# Patient Record
Sex: Male | Born: 1985 | Race: White | Hispanic: No | Marital: Married | Smoking: Former smoker
Health system: Southern US, Community
[De-identification: ages and names within clinical notes are randomized; demographics above are authoritative.]

## PROBLEM LIST (undated history)

## (undated) DIAGNOSIS — K859 Acute pancreatitis without necrosis or infection, unspecified: Secondary | ICD-10-CM

## (undated) DIAGNOSIS — R32 Unspecified urinary incontinence: Secondary | ICD-10-CM

## (undated) DIAGNOSIS — M1990 Unspecified osteoarthritis, unspecified site: Secondary | ICD-10-CM

## (undated) DIAGNOSIS — E119 Type 2 diabetes mellitus without complications: Secondary | ICD-10-CM

## (undated) DIAGNOSIS — K861 Other chronic pancreatitis: Secondary | ICD-10-CM

## (undated) DIAGNOSIS — M879 Osteonecrosis, unspecified: Secondary | ICD-10-CM

## (undated) DIAGNOSIS — G629 Polyneuropathy, unspecified: Secondary | ICD-10-CM

## (undated) DIAGNOSIS — M542 Cervicalgia: Secondary | ICD-10-CM

## (undated) DIAGNOSIS — F319 Bipolar disorder, unspecified: Secondary | ICD-10-CM

## (undated) DIAGNOSIS — F419 Anxiety disorder, unspecified: Secondary | ICD-10-CM

## (undated) DIAGNOSIS — R159 Full incontinence of feces: Secondary | ICD-10-CM

## (undated) DIAGNOSIS — F259 Schizoaffective disorder, unspecified: Secondary | ICD-10-CM

## (undated) DIAGNOSIS — M549 Dorsalgia, unspecified: Secondary | ICD-10-CM

## (undated) DIAGNOSIS — K3184 Gastroparesis: Secondary | ICD-10-CM

## (undated) DIAGNOSIS — F329 Major depressive disorder, single episode, unspecified: Secondary | ICD-10-CM

## (undated) DIAGNOSIS — F32A Depression, unspecified: Secondary | ICD-10-CM

## (undated) DIAGNOSIS — G459 Transient cerebral ischemic attack, unspecified: Secondary | ICD-10-CM

## (undated) HISTORY — PX: LITHOTRIPSY: SUR834

## (undated) HISTORY — PX: ELBOW FRACTURE SURGERY: SHX616

## (undated) HISTORY — PX: NASAL SINUS SURGERY: SHX719

## (undated) HISTORY — PX: HERNIA REPAIR: SHX51

## (undated) HISTORY — PX: FOOT SURGERY: SHX648

## (undated) HISTORY — PX: CHOLECYSTECTOMY: SHX55

## (undated) HISTORY — PX: OTHER SURGICAL HISTORY: SHX169

## (undated) HISTORY — PX: COMBINED KIDNEY-PANCREAS TRANSPLANT: SHX1382

---
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
## 2019-01-26 ENCOUNTER — Encounter (HOSPITAL_COMMUNITY): Payer: Self-pay | Admitting: Emergency Medicine

## 2019-01-26 ENCOUNTER — Other Ambulatory Visit: Payer: Self-pay

## 2019-01-26 DIAGNOSIS — D649 Anemia, unspecified: Secondary | ICD-10-CM | POA: Diagnosis present

## 2019-01-26 DIAGNOSIS — Z7951 Long term (current) use of inhaled steroids: Secondary | ICD-10-CM | POA: Insufficient documentation

## 2019-01-26 DIAGNOSIS — N200 Calculus of kidney: Secondary | ICD-10-CM | POA: Diagnosis not present

## 2019-01-26 DIAGNOSIS — E119 Type 2 diabetes mellitus without complications: Secondary | ICD-10-CM | POA: Diagnosis not present

## 2019-01-26 DIAGNOSIS — F319 Bipolar disorder, unspecified: Secondary | ICD-10-CM | POA: Insufficient documentation

## 2019-01-26 DIAGNOSIS — Z79899 Other long term (current) drug therapy: Secondary | ICD-10-CM | POA: Diagnosis not present

## 2019-01-26 DIAGNOSIS — Z87891 Personal history of nicotine dependence: Secondary | ICD-10-CM | POA: Diagnosis not present

## 2019-01-26 DIAGNOSIS — Z7984 Long term (current) use of oral hypoglycemic drugs: Secondary | ICD-10-CM | POA: Insufficient documentation

## 2019-01-26 DIAGNOSIS — R740 Nonspecific elevation of levels of transaminase and lactic acid dehydrogenase [LDH]: Secondary | ICD-10-CM | POA: Insufficient documentation

## 2019-01-26 DIAGNOSIS — K859 Acute pancreatitis without necrosis or infection, unspecified: Secondary | ICD-10-CM | POA: Diagnosis not present

## 2019-01-26 DIAGNOSIS — F418 Other specified anxiety disorders: Secondary | ICD-10-CM | POA: Insufficient documentation

## 2019-01-26 DIAGNOSIS — Z791 Long term (current) use of non-steroidal anti-inflammatories (NSAID): Secondary | ICD-10-CM | POA: Insufficient documentation

## 2019-01-26 DIAGNOSIS — Z9049 Acquired absence of other specified parts of digestive tract: Secondary | ICD-10-CM | POA: Diagnosis not present

## 2019-01-26 MED ORDER — SODIUM CHLORIDE 0.9 % IV BOLUS
1000.0000 mL | Freq: Once | INTRAVENOUS | Status: AC
Start: 2019-01-26 — End: 2019-01-27
  Administered 2019-01-27: 1000 mL via INTRAVENOUS

## 2019-01-26 MED ORDER — ONDANSETRON HCL 4 MG/2ML IJ SOLN
4.0000 mg | Freq: Once | INTRAMUSCULAR | Status: AC
  Administered 2019-01-27: 4 mg via INTRAVENOUS
  Filled 2019-01-26: qty 2

## 2019-01-26 NOTE — ED Triage Notes (Signed)
Pt reports he has had 7 episodes of pancreatitis since the summer of 2019 and feels same, pt c/o left upper abd that radiates to the upper mid abd with n/v, denies diarrhea, pt reports last BM was today, pain started this am

## 2019-01-27 ENCOUNTER — Observation Stay (HOSPITAL_COMMUNITY): Payer: Medicaid - Out of State

## 2019-01-27 ENCOUNTER — Observation Stay (HOSPITAL_COMMUNITY)
Admission: EM | Admit: 2019-01-27 | Discharge: 2019-01-28 | Disposition: A | Discharge status: Home / Self Care | Payer: Medicaid - Out of State | Attending: Family Medicine | Admitting: Family Medicine

## 2019-01-27 DIAGNOSIS — R7401 Elevation of levels of liver transaminase levels: Secondary | ICD-10-CM

## 2019-01-27 DIAGNOSIS — R740 Nonspecific elevation of levels of transaminase and lactic acid dehydrogenase [LDH]: Secondary | ICD-10-CM

## 2019-01-27 DIAGNOSIS — K859 Acute pancreatitis without necrosis or infection, unspecified: Secondary | ICD-10-CM

## 2019-01-27 DIAGNOSIS — Z794 Long term (current) use of insulin: Secondary | ICD-10-CM

## 2019-01-27 DIAGNOSIS — K858 Other acute pancreatitis without necrosis or infection: Secondary | ICD-10-CM | POA: Diagnosis not present

## 2019-01-27 DIAGNOSIS — D649 Anemia, unspecified: Secondary | ICD-10-CM

## 2019-01-27 DIAGNOSIS — E119 Type 2 diabetes mellitus without complications: Secondary | ICD-10-CM

## 2019-01-27 DIAGNOSIS — F319 Bipolar disorder, unspecified: Secondary | ICD-10-CM

## 2019-01-27 DIAGNOSIS — F418 Other specified anxiety disorders: Secondary | ICD-10-CM

## 2019-01-27 HISTORY — DX: Major depressive disorder, single episode, unspecified: F32.9

## 2019-01-27 HISTORY — DX: Cervicalgia: M54.2

## 2019-01-27 HISTORY — DX: Schizoaffective disorder, unspecified: F25.9

## 2019-01-27 HISTORY — DX: Depression, unspecified: F32.A

## 2019-01-27 HISTORY — DX: Type 2 diabetes mellitus without complications: E11.9

## 2019-01-27 HISTORY — DX: Anxiety disorder, unspecified: F41.9

## 2019-01-27 HISTORY — DX: Dorsalgia, unspecified: M54.9

## 2019-01-27 HISTORY — DX: Unspecified osteoarthritis, unspecified site: M19.90

## 2019-01-27 HISTORY — DX: Bipolar disorder, unspecified: F31.9

## 2019-01-27 LAB — CBC WITH DIFFERENTIAL/PLATELET
ABS IMMATURE GRANULOCYTES: 0.03 10*3/uL (ref 0.00–0.07)
Basophils Absolute: 0.1 10*3/uL (ref 0.0–0.1)
Basophils Relative: 1 %
Eosinophils Absolute: 0.1 10*3/uL (ref 0.0–0.5)
Eosinophils Relative: 2 %
HCT: 41.3 % (ref 39.0–52.0)
HEMOGLOBIN: 12.7 g/dL — AB (ref 13.0–17.0)
Immature Granulocytes: 0 %
LYMPHS PCT: 43 %
Lymphs Abs: 3.4 10*3/uL (ref 0.7–4.0)
MCH: 25.1 pg — ABNORMAL LOW (ref 26.0–34.0)
MCHC: 30.8 g/dL (ref 30.0–36.0)
MCV: 81.6 fL (ref 80.0–100.0)
MONO ABS: 0.7 10*3/uL (ref 0.1–1.0)
Monocytes Relative: 9 %
NEUTROS ABS: 3.6 10*3/uL (ref 1.7–7.7)
NRBC: 0 % (ref 0.0–0.2)
Neutrophils Relative %: 45 %
Platelets: 290 10*3/uL (ref 150–400)
RBC: 5.06 MIL/uL (ref 4.22–5.81)
RDW: 13.2 % (ref 11.5–15.5)
WBC: 7.9 10*3/uL (ref 4.0–10.5)

## 2019-01-27 LAB — COMPREHENSIVE METABOLIC PANEL
ALBUMIN: 4.5 g/dL (ref 3.5–5.0)
ALT: 45 U/L — ABNORMAL HIGH (ref 0–44)
AST: 28 U/L (ref 15–41)
Alkaline Phosphatase: 108 U/L (ref 38–126)
Anion gap: 8 (ref 5–15)
BUN: 17 mg/dL (ref 6–20)
CHLORIDE: 105 mmol/L (ref 98–111)
CO2: 23 mmol/L (ref 22–32)
CREATININE: 1.05 mg/dL (ref 0.61–1.24)
Calcium: 9.2 mg/dL (ref 8.9–10.3)
GFR calc non Af Amer: >60 mL/min (ref >60)
GLUCOSE: 136 mg/dL — AB (ref 70–99)
Potassium: 3.8 mmol/L (ref 3.5–5.1)
SODIUM: 136 mmol/L (ref 135–145)
Total Bilirubin: 0.3 mg/dL (ref 0.3–1.2)
Total Protein: 7.2 g/dL (ref 6.5–8.1)

## 2019-01-27 LAB — MRSA PCR SCREENING: MRSA by PCR: NEGATIVE

## 2019-01-27 LAB — GLUCOSE, CAPILLARY: Glucose-Capillary: 117 mg/dL — ABNORMAL HIGH (ref 70–99)

## 2019-01-27 LAB — CBG MONITORING, ED
GLUCOSE-CAPILLARY: 101 mg/dL — AB (ref 70–99)
Glucose-Capillary: 123 mg/dL — ABNORMAL HIGH (ref 70–99)

## 2019-01-27 LAB — LIPASE, BLOOD: Lipase: 110 U/L — ABNORMAL HIGH (ref 11–51)

## 2019-01-27 MED ORDER — LEVOCETIRIZINE DIHYDROCHLORIDE 5 MG PO TABS: 5.0000 mg | ORAL_TABLET | Freq: Every evening | ORAL | Status: DC

## 2019-01-27 MED ORDER — MONTELUKAST SODIUM 10 MG PO TABS
10.0000 mg | ORAL_TABLET | Freq: Every day | ORAL | Status: DC
  Administered 2019-01-27: 10 mg via ORAL
  Filled 2019-01-27: qty 1

## 2019-01-27 MED ORDER — IOHEXOL 300 MG/ML  SOLN
100.0000 mL | Freq: Once | INTRAMUSCULAR | Status: AC | PRN
  Administered 2019-01-27: 100 mL via INTRAVENOUS

## 2019-01-27 MED ORDER — DIVALPROEX SODIUM 250 MG PO DR TAB
250.0000 mg | DELAYED_RELEASE_TABLET | Freq: Two times a day (BID) | ORAL | Status: DC
  Administered 2019-01-27 – 2019-01-28 (×3): 250 mg via ORAL
  Filled 2019-01-27 (×3): qty 1

## 2019-01-27 MED ORDER — INSULIN ASPART 100 UNIT/ML ~~LOC~~ SOLN: 0.0000 [IU] | SUBCUTANEOUS | Status: DC

## 2019-01-27 MED ORDER — ESCITALOPRAM OXALATE 10 MG PO TABS
20.0000 mg | ORAL_TABLET | Freq: Every day | ORAL | Status: DC
  Administered 2019-01-27 – 2019-01-28 (×2): 20 mg via ORAL
  Filled 2019-01-27 (×2): qty 2

## 2019-01-27 MED ORDER — DOCUSATE SODIUM 100 MG PO CAPS
100.0000 mg | ORAL_CAPSULE | Freq: Every day | ORAL | Status: DC
  Administered 2019-01-27 – 2019-01-28 (×2): 100 mg via ORAL
  Filled 2019-01-27 (×2): qty 1

## 2019-01-27 MED ORDER — HYDROMORPHONE HCL 1 MG/ML IJ SOLN
1.0000 mg | INTRAMUSCULAR | Status: DC | PRN
  Administered 2019-01-27 – 2019-01-28 (×5): 1 mg via INTRAVENOUS
  Filled 2019-01-27 (×5): qty 1

## 2019-01-27 MED ORDER — HYDROXYZINE HCL 25 MG PO TABS
50.0000 mg | ORAL_TABLET | Freq: Four times a day (QID) | ORAL | Status: DC
  Administered 2019-01-27 – 2019-01-28 (×4): 50 mg via ORAL
  Filled 2019-01-27 (×4): qty 2

## 2019-01-27 MED ORDER — MORPHINE SULFATE (PF) 4 MG/ML IV SOLN
4.0000 mg | Freq: Once | INTRAVENOUS | Status: AC
  Administered 2019-01-27: 4 mg via INTRAVENOUS
  Filled 2019-01-27: qty 1

## 2019-01-27 MED ORDER — IOPAMIDOL (ISOVUE-300) INJECTION 61%
30.0000 mL | Freq: Once | INTRAVENOUS | Status: AC | PRN
  Administered 2019-01-27: 30 mL via ORAL

## 2019-01-27 MED ORDER — SUCRALFATE 1 G PO TABS
1.0000 g | ORAL_TABLET | Freq: Two times a day (BID) | ORAL | Status: DC
  Administered 2019-01-27 – 2019-01-28 (×3): 1 g via ORAL
  Filled 2019-01-27 (×3): qty 1

## 2019-01-27 MED ORDER — PROPRANOLOL HCL 20 MG PO TABS
40.0000 mg | ORAL_TABLET | Freq: Two times a day (BID) | ORAL | Status: DC
  Administered 2019-01-27 – 2019-01-28 (×3): 40 mg via ORAL
  Filled 2019-01-27: qty 4
  Filled 2019-01-27 (×2): qty 2

## 2019-01-27 MED ORDER — LORATADINE 10 MG PO TABS
10.0000 mg | ORAL_TABLET | Freq: Every day | ORAL | Status: DC
  Administered 2019-01-27 – 2019-01-28 (×2): 10 mg via ORAL
  Filled 2019-01-27 (×2): qty 1

## 2019-01-27 MED ORDER — PANTOPRAZOLE SODIUM 40 MG PO TBEC
40.0000 mg | DELAYED_RELEASE_TABLET | Freq: Every day | ORAL | Status: DC
  Administered 2019-01-27 – 2019-01-28 (×2): 40 mg via ORAL
  Filled 2019-01-27 (×2): qty 1

## 2019-01-27 MED ORDER — ONDANSETRON HCL 4 MG/2ML IJ SOLN
4.0000 mg | Freq: Four times a day (QID) | INTRAMUSCULAR | Status: DC | PRN
  Administered 2019-01-27: 4 mg via INTRAVENOUS
  Filled 2019-01-27: qty 2

## 2019-01-27 MED ORDER — SODIUM CHLORIDE 0.9 % IV SOLN
INTRAVENOUS | Status: DC
Start: 2019-01-27 — End: 2019-01-28
  Administered 2019-01-27 (×2): via INTRAVENOUS

## 2019-01-27 MED ORDER — POLYVINYL ALCOHOL 1.4 % OP SOLN
2.0000 [drp] | Freq: Every day | OPHTHALMIC | Status: DC
  Administered 2019-01-27 – 2019-01-28 (×2): 2 [drp] via OPHTHALMIC
  Filled 2019-01-27 (×2): qty 15

## 2019-01-27 MED ORDER — ACETAMINOPHEN 650 MG RE SUPP: 650.0000 mg | Freq: Four times a day (QID) | RECTAL | Status: DC | PRN

## 2019-01-27 MED ORDER — ARIPIPRAZOLE 10 MG PO TABS
10.0000 mg | ORAL_TABLET | Freq: Every day | ORAL | Status: DC
  Administered 2019-01-27 – 2019-01-28 (×2): 10 mg via ORAL
  Filled 2019-01-27: qty 2
  Filled 2019-01-27: qty 1

## 2019-01-27 MED ORDER — ACETAMINOPHEN 325 MG PO TABS: 650.0000 mg | ORAL_TABLET | Freq: Four times a day (QID) | ORAL | Status: DC | PRN

## 2019-01-27 MED ORDER — SODIUM CHLORIDE 0.9 % IV BOLUS
1000.0000 mL | Freq: Once | INTRAVENOUS | Status: AC
  Administered 2019-01-27: 1000 mL via INTRAVENOUS

## 2019-01-27 MED ORDER — ENOXAPARIN SODIUM 40 MG/0.4ML ~~LOC~~ SOLN
40.0000 mg | SUBCUTANEOUS | Status: DC
  Administered 2019-01-27: 40 mg via SUBCUTANEOUS
  Filled 2019-01-27: qty 0.4

## 2019-01-27 MED ORDER — LIFITEGRAST 5 % OP SOLN: 2.0000 [drp] | Freq: Every day | OPHTHALMIC | Status: DC

## 2019-01-27 MED ORDER — ALBUTEROL SULFATE (2.5 MG/3ML) 0.083% IN NEBU: 2.5000 mg | INHALATION_SOLUTION | RESPIRATORY_TRACT | Status: DC | PRN

## 2019-01-27 MED ORDER — ALBUTEROL SULFATE HFA 108 (90 BASE) MCG/ACT IN AERS
1.0000 | INHALATION_SPRAY | RESPIRATORY_TRACT | Status: DC | PRN
  Filled 2019-01-27: qty 6.7

## 2019-01-27 MED ORDER — FLUTICASONE PROPIONATE 50 MCG/ACT NA SUSP
1.0000 | Freq: Every day | NASAL | Status: DC
  Administered 2019-01-27 – 2019-01-28 (×2): 1 via NASAL
  Filled 2019-01-27 (×2): qty 16

## 2019-01-27 MED ORDER — PRAZOSIN HCL 5 MG PO CAPS
5.0000 mg | ORAL_CAPSULE | Freq: Every day | ORAL | Status: DC
  Administered 2019-01-27: 5 mg via ORAL
  Filled 2019-01-27 (×3): qty 1

## 2019-01-27 MED ORDER — PHENTERMINE HCL 37.5 MG PO CAPS: 37.5000 mg | ORAL_CAPSULE | ORAL | Status: DC

## 2019-01-27 MED ORDER — GABAPENTIN 100 MG PO CAPS
800.0000 mg | ORAL_CAPSULE | Freq: Three times a day (TID) | ORAL | Status: DC
  Administered 2019-01-27 – 2019-01-28 (×2): 800 mg via ORAL
  Filled 2019-01-27 (×2): qty 8

## 2019-01-27 NOTE — ED Notes (Signed)
edp in room  

## 2019-01-27 NOTE — H&P (Signed)
History and Physical    Dennis Yang ZOX:[id number] DOB: [birth year] DOA: 01/27/2019  PCP: Patient, [name]   Patient coming from: Home  Chief Complaint: Epigastric abdominal pain  HPI: Dennis Yang is a 33 y.o. male with medical history significant for bipolar disorder, type 2 diabetes, depression/anxiety, and recurrent pancreatitis who presented to the ED with epigastric and left upper quadrant abdominal pain which started at approximately 11 AM yesterday.  He states he had radiation of pain to his back with associated nausea and vomiting and rated the pain 9/10.  This is similar to previous episodes of pancreatitis that he has had before with [facility] well as [facility] where he sees gastroenterology.  He has not had any alcohol for the last 2 years and has not had any other unusual food intake.  He is having bowel movements that are not dark or bloody and is passing flatus.   ED Course: Vital signs are stable and laboratory data with lipase of 110 noted.  LFTs are otherwise normal.  CT of the abdomen pelvis with contrast does demonstrate inflammation suggestive of acute pancreatitis to the head of the pancreas along with increase in the caliber of the proximal small bowel with tapering off suggestive of possible ileus and/or bowel obstruction.  Patient has been given 2 L IV fluid bolus and remains on aggressive IV fluid resuscitation.  He has been given multiple doses of IV morphine with minimal pain relief, but states that Dilaudid is not helping.  He denies any significant nausea or vomiting at this time after Zofran administration.  Review of Systems: All others reviewed and otherwise negative.  Past Medical History:  Diagnosis Date  . Anxiety   . Back pain   . Bipolar 1 disorder (HCC)   . Depression   . Diabetes mellitus without complication (HCC)   . Neck pain   . Osteoarthritis   . Schizoaffective disorder [facility](HCC)     Past  Surgical History:  Procedure Laterality Date  . CHOLECYSTECTOMY    . ELBOW FRACTURE SURGERY    . HERNIA REPAIR    . LITHOTRIPSY    . NASAL SINUS SURGERY       reports that he has quit smoking. He has never used smokeless tobacco. He reports previous alcohol use. He reports previous drug use.  Allergies  Allergen Reactions  . Penicillins Anaphylaxis    Did it involve swelling of the face/tongue/throat, SOB, or low BP? Yes Did it involve sudden or severe rash/hives, skin peeling, or any reaction on the inside of your mouth or nose? Yes Did you need to seek medical attention at a hospital or doctor's office? Yes When did it last happen? If all above answers are "NO", may proceed with cephalosporin use.   . Ciprofloxacin Hives    History reviewed. No pertinent family history.  Prior to Admission medications   Medication Sig Start Date End Date Taking? Authorizing Provider  ARIPiprazole (ABILIFY) 10 MG tablet Take 10 mg by mouth daily.  01/22/19  Yes [provider]  ATROVENT HFA 17 MCG/ACT inhaler Inhale 1 puff into the lungs every 4 (four) hours as needed for wheezing.  01/16/19  Yes [provider]  CARAFATE 1 g tablet Take 1 g by mouth 2 (two) times daily.  12/20/18  Yes [provider]  celecoxib (CELEBREX) 100 MG capsule Take 100 mg by mouth 2 (two) times daily.  01/12/19  Yes [provider]  Cholecalciferol (VITAMIN D3) 1.25 MG (50000 UT) CAPS Take 1 capsule by mouth. 3 times a week 12/24/18  Yes [provider]  divalproex (DEPAKOTE) 250 MG DR tablet Take 250 mg by mouth 2 (two) times daily.  01/22/19  Yes [provider]  docusate sodium (COLACE) 100 MG capsule Take 100 mg by mouth daily.  01/01/19  Yes [provider]  EMGALITY 120 MG/ML SOAJ Inject 1 Dose into the muscle every 30 (thirty) days.  11/24/18  Yes [provider]  escitalopram (LEXAPRO) 20 MG tablet Take 20 mg by mouth daily.  01/22/19  Yes  [provider]  FARXIGA 5 MG TABS tablet Take 5 mg by mouth daily.  01/07/19  Yes [provider]  fluticasone (FLONASE) 50 MCG/ACT nasal spray Place 1 spray into both nostrils daily.  01/20/19  Yes [provider]  gabapentin (NEURONTIN) 800 MG tablet Take 800 mg by mouth 3 (three) times daily. 01/22/19  Yes [provider]  glipiZIDE (GLUCOTROL) 10 MG tablet Take 10 mg by mouth 2 (two) times daily. 01/19/19  Yes [provider]  hydrOXYzine (ATARAX/VISTARIL) 50 MG tablet Take 50 mg by mouth 4 (four) times daily. 01/22/19  Yes [provider]  levocetirizine (XYZAL) 5 MG tablet Take 5 mg by mouth every evening.  01/15/19  Yes [provider]  metaxalone (SKELAXIN) 800 MG tablet Take 800 mg by mouth 2 (two) times daily.  01/12/19  Yes [provider]  montelukast (SINGULAIR) 10 MG tablet Take 10 mg by mouth daily.  01/12/19  Yes [provider]  omeprazole (PRILOSEC) 40 MG capsule Take 40 mg by mouth daily.  01/19/19  Yes [provider]  ondansetron (ZOFRAN-ODT) 8 MG disintegrating tablet Take 8 mg by mouth every 8 (eight) hours as needed for nausea.  01/17/19  Yes [provider]  phentermine 37.5 MG capsule Take 37.5 mg by mouth every morning.   Yes [provider]  prazosin (MINIPRESS) 5 MG capsule Take 5 mg by mouth at bedtime.  01/22/19  Yes [provider]  PROAIR HFA 108 (90 Base) MCG/ACT inhaler Inhale 1-2 puffs into the lungs every 4 (four) hours as needed for wheezing.  01/17/19  Yes [provider]  propranolol (INDERAL) 40 MG tablet Take 40 mg by mouth 2 (two) times daily.  01/12/19  Yes [provider]  simvastatin (ZOCOR) 20 MG tablet Take 20 mg by mouth daily at 6 PM.  12/25/18  Yes [provider]  XIIDRA 5 % SOLN Place 2 drops into both eyes daily.  01/21/19  Yes [provider]    Physical Exam: Vitals:   01/27/19 0511 01/27/19 0911 01/27/19 0930  01/27/19 1101  BP: 124/77 128/70 123/68 119/75  Pulse:  69 69 65  Resp:  16  16  Temp:  97.7 F (36.5 C)    TempSrc:  Oral    SpO2:  100% 97% 99%  Weight:      Height:        Constitutional: NAD, calm, mildly uncomfortable Vitals:   01/27/19 0511 01/27/19 0911 01/27/19 0930 01/27/19 1101  BP: 124/77 128/70 123/68 119/75  Pulse:  69 69 65  Resp:  16  16  Temp:  97.7 F (36.5 C)    TempSrc:  Oral    SpO2:  100% 97% 99%  Weight:      Height:       Eyes: lids and conjunctivae normal ENMT: Mucous membranes are moist.  Neck:  normal, supple Respiratory: clear to auscultation bilaterally. Normal respiratory effort. No accessory muscle use.  Cardiovascular: Regular rate and rhythm, no murmurs. No extremity edema. Abdomen: Generalized tenderness to palpation, no distention. Bowel sounds positive.  Musculoskeletal:  No joint deformity upper and lower extremities.   Skin: no rashes, lesions, ulcers.  Tattoos throughout Psychiatric: Normal judgment and insight. Alert and oriented x 3. Normal mood.   Labs on Admission: I have personally reviewed following labs and imaging studies  CBC: Recent Labs  Lab 01/26/19 2359  WBC 7.9  NEUTROABS 3.6  HGB 12.7*  HCT 41.3  MCV 81.6  PLT 290   Basic Metabolic Panel: Recent Labs  Lab 01/26/19 2359  NA 136  K 3.8  CL 105  CO2 23  GLUCOSE 136*  BUN 17  CREATININE 1.05  CALCIUM 9.2   GFR: Estimated Creatinine Clearance: 124.6 mL/min (by C-G formula based on SCr of 1.05 mg/dL). Liver Function Tests: Recent Labs  Lab 01/26/19 2359  AST 28  ALT 45*  ALKPHOS 108  BILITOT 0.3  PROT 7.2  ALBUMIN 4.5   Recent Labs  Lab 01/26/19 2359  LIPASE 110*   No results for input(s): AMMONIA in the last 168 hours. Coagulation Profile: No results for input(s): INR, PROTIME in the last 168 hours. Cardiac Enzymes: No results for input(s): CKTOTAL, CKMB, CKMBINDEX, TROPONINI in the last 168 hours. BNP (last 3 results) No results for  input(s): PROBNP in the last 8760 hours. HbA1C: No results for input(s): HGBA1C in the last 72 hours. CBG: No results for input(s): GLUCAP in the last 168 hours. Lipid Profile: No results for input(s): CHOL, HDL, LDLCALC, TRIG, CHOLHDL, LDLDIRECT in the last 72 hours. Thyroid Function Tests: No results for input(s): TSH, T4TOTAL, FREET4, T3FREE, THYROIDAB in the last 72 hours. Anemia Panel: No results for input(s): VITAMINB12, FOLATE, FERRITIN, TIBC, IRON, RETICCTPCT in the last 72 hours. Urine analysis: No results found for: COLORURINE, APPEARANCEUR, LABSPEC, PHURINE, GLUCOSEU, HGBUR, BILIRUBINUR, KETONESUR, PROTEINUR, UROBILINOGEN, NITRITE, LEUKOCYTESUR  Radiological Exams on Admission: Ct Abdomen Pelvis W Contrast  Result Date: 01/27/2019 CLINICAL DATA:  Left upper abdominal pain. EXAM: CT ABDOMEN AND PELVIS WITH CONTRAST TECHNIQUE: Multidetector CT imaging of the abdomen and pelvis was performed using the standard protocol following bolus administration of intravenous contrast. CONTRAST:  30mL ISOVUE-300 IOPAMIDOL (ISOVUE-300) INJECTION 61%, 100mL OMNIPAQUE IOHEXOL 300 MG/ML SOLN COMPARISON:  10/04/2017 FINDINGS: Lower chest: No acute abnormality. Scarring identified within both lung bases. Hepatobiliary: No focal liver abnormality. Previous cholecystectomy. No biliary dilatation. Pancreas: Calcifications within the head of pancreas are identified, new from 10/04/2017. There is mild edema surrounding the head of pancreas and adjacent soft tissues. No significant free fluid. No main duct dilatation identified. Spleen: Normal in size without focal abnormality. Adrenals/Urinary Tract: The adrenal glands appear normal. Two right renal calculi identified measuring up to 4 mm. No mass or hydronephrosis identified bilaterally. The urinary bladder appears normal. Stomach/Bowel: The stomach appears normal. Focal area of luminal narrowing of the descending duodenum adjacent to head of pancreas is noted.  Proximal and mid small bowel loops are increased in caliber with air-fluid levels. These measure up to 3.1 cm. Transition to normal caliber distal small bowel is noted within the right abdomen, image 45/2 and image 42/8. Within the left upper quadrant of the abdomen there is a segment of proximal small bowel which exhibits abnormal wall thickening measuring up to 0.9 cm. Fecalization enteric contents within the terminal ileum is identified, image 44/8 which may reflect decreased  bowel transit. Unremarkable appearance of the colon. Vascular/Lymphatic: No significant vascular findings are present. No enlarged abdominal or pelvic lymph nodes. Reproductive: Status post hysterectomy. No adnexal masses. Other: No free fluid or fluid collections identified. Musculoskeletal: No acute or significant osseous findings. IMPRESSION: 1. There is mild edema and inflammation involving the head of pancreas. Calcifications within the head of pancreas are new from 11/06/2017. Findings are suspicious for acute on chronic pancreatitis. No significant free fluid, pancreatic necrosis or pseudocyst. 2. There is abnormal increase caliber of the proximal and mid small bowel loops with a transition to decreased distal small bowel. Imaging findings may reflect small-bowel ileus secondary to pancreatic inflammation. Small bowel obstruction is not excluded. 3. Focal segment of proximal small bowel within the left upper quadrant of the abdomen exhibits wall thickening. This may reflect secondary inflammation from pancreatitis or nonspecific infectious enteritis. Clinical correlation advised. 4. Nonobstructing right renal calculi. Electronically Signed   By: Signa Kell M.D.   On: 01/27/2019 10:34    Assessment/Plan Principal Problem:   Acute pancreatitis Active Problems:   Bipolar 1 disorder (HCC)   Depression with anxiety   Type 2 diabetes mellitus without complication (HCC)    1. Acute, recurrent pancreatitis.  Will follow liver  enzymes as well as lipase in a.m. and maintain on aggressive IV fluid hydration.  Zofran as needed for nausea and vomiting.  Pain management with Dilaudid every 2 hours which appears to be helping.  Please consult GI as needed, but will avoid for now. 2. Ileus versus partial SBO related to above.  This appears to be inflammatory nature per CT scan.  We will try liquids for now and advance diet as tolerated.  Recheck electrolytes in a.m. 3. Bipolar 1 disorder.  Restart home medications and monitor. 4. Depression with anxiety.  Restart home medications. 5. Type 2 diabetes.  Currently, without significant hyperglycemia.  Will start on SSI and avoid home oral hypoglycemics until diet has stabilized.   DVT prophylaxis: Lovenox Code Status: Full Family Communication: None at bedside Disposition Plan:Admit for treatment of acute pancreatitis Consults called:None Admission status: Observation, MedSurg   Leonard Hendler Hoover Brunette DO [facility] Pager [phone]  If 7PM-7AM, please contact night-coverage [email or web address] Password [email or web address]  01/27/2019, 12:00 PM

## 2019-01-27 NOTE — ED Provider Notes (Signed)
[facility] EMERGENCY DEPARTMENT Provider Note   CSN: [id number] Arrival date & time: 01/26/19  2039    History   Chief Complaint Chief Complaint  Patient presents with  . Abdominal Pain    HPI Dennis Yang is a 33 y.o. male.  The history is provided by the patient.  He has history of bipolar disorder, diabetes, pancreatitis and comes in with periumbilical and left upper quadrant pain which started about 11 AM.  There is radiation of pain to the back.  There is associated nausea without vomiting.  He rates pain at 9/10.  Pain is typical to what he has had with episodes of pancreatitis before.  He was last admitted for pancreatitis 2 months ago in [place].  He has been followed by gastroenterology at [facility].  He states that he did drink up until about 2 years ago but has been abstinent since then.  Apparently, reason for recurrent episodes of pancreatitis has not been discerned.  He denies alcohol consumption recently and denies unusual food intake.  Past Medical History:  Diagnosis Date  . Anxiety   . Back pain   . Bipolar 1 disorder (HCC)   . Depression   . Diabetes mellitus without complication (HCC)   . Neck pain   . Osteoarthritis   . Schizoaffective disorder (HCC)     There are no active problems to display for this patient.   Past Surgical History:  Procedure Laterality Date  . CHOLECYSTECTOMY    . ELBOW FRACTURE SURGERY    . HERNIA REPAIR    . LITHOTRIPSY    . NASAL SINUS SURGERY          Home Medications    Prior to Admission medications   Not on File    Family History History reviewed. No pertinent family history.  Social History Social History   Tobacco Use  . Smoking status: Former Games developer  . Smokeless tobacco: Never Used  Substance Use Topics  . Alcohol use: Not Currently  . Drug use: Not Currently     Allergies   Penicillins and Ciprofloxacin   Review of Systems Review of Systems  All other systems  reviewed and are negative.    Physical Exam Updated Vital Signs BP 129/79 (BP Location: Right Arm)   Pulse 71   Temp 97.6 F (36.4 C) (Oral)   Resp 18   Ht 6' (1.829 m)   Wt 97.5 kg   SpO2 98%   BMI 29.16 kg/m   Physical Exam Vitals signs and nursing note reviewed.    33 year old male, resting comfortably and in no acute distress. Vital signs are normal. Oxygen saturation is 98%, which is normal. Head is normocephalic and atraumatic. PERRLA, EOMI. Oropharynx is clear. Neck is nontender and supple without adenopathy or JVD. Back is nontender and there is no CVA tenderness. Lungs are clear without rales, wheezes, or rhonchi. Chest is nontender. Heart has regular rate and rhythm without murmur. Abdomen is soft, flat, with moderate upper abdominal tenderness which is poorly localized.  There is no rebound or guarding.  There are no masses or hepatosplenomegaly and peristalsis is normoactive. Extremities have no cyanosis or edema, full range of motion is present. Skin is warm and dry without rash. Neurologic: Mental status is normal, cranial nerves are intact, there are no motor or sensory deficits.  ED Treatments / Results  Labs (all labs ordered are listed, but only abnormal results are displayed) Labs Reviewed  COMPREHENSIVE METABOLIC PANEL -  Abnormal; Notable for the following components:      Result Value   Glucose, Bld 136 (*)    ALT 45 (*)    All other components within normal limits  LIPASE, BLOOD - Abnormal; Notable for the following components:   Lipase 110 (*)    All other components within normal limits  CBC WITH DIFFERENTIAL/PLATELET - Abnormal; Notable for the following components:   Hemoglobin 12.7 (*)    MCH 25.1 (*)    All other components within normal limits   Procedures Procedures  Medications Ordered in ED Medications  sodium chloride 0.9 % bolus 1,000 mL (0 mLs Intravenous Stopped 01/27/19 0509)  ondansetron (ZOFRAN) injection 4 mg (4 mg  Intravenous Given 01/27/19 0358)  morphine 4 MG/ML injection 4 mg (4 mg Intravenous Given 01/27/19 0358)  morphine 4 MG/ML injection 4 mg (4 mg Intravenous Given 01/27/19 0509)  morphine 4 MG/ML injection 4 mg (4 mg Intravenous Given 01/27/19 0611)  iopamidol (ISOVUE-300) 61 % injection 30 mL (30 mLs Oral Contrast Given 01/27/19 0737)     Initial Impression / Assessment and Plan / ED Course  I have reviewed the triage vital signs and the nursing notes.  Pertinent lab results that were available during my care of the patient were reviewed by me and considered in my medical decision making (see chart for details).  Recurrent abdominal pain consistent with pancreatitis.  Labs obtained at triage do show elevated lipase at 110.  Hemoglobin is slightly low at 12.7.  He has no prior records in the [facility] health system, but on care everywhere, hemoglobin is noted to have been 12.4 on December 31.  Lipase has been as high as 4577 in July 2018, but has been normal since then.  At this point, I do not see indication for CT scan.  We will try IV fluids, morphine, ondansetron.  Patient does want a second opinion, and he will be referred to gastroenterology at [facility].  Following IV fluids, ondansetron, morphine, pain was not improved but nausea was.  He was given 2 additional doses of morphine which failed to help his pain.  Case was discussed with Dr. Sherryll Burger of [facility], who agrees to admit the patient.  Final Clinical Impressions(s) / ED Diagnoses   Final diagnoses:  Acute pancreatitis, unspecified complication status, unspecified pancreatitis type  Elevated AST (SGOT)  Normochromic normocytic anemia    ED Discharge Orders    None       Dione Booze, MD 01/27/19 [phone]

## 2019-01-27 NOTE — ED Notes (Signed)
Completed oral contrast. 

## 2019-01-27 NOTE — ED Notes (Signed)
Pt given clear liquid meal tray

## 2019-01-28 DIAGNOSIS — K858 Other acute pancreatitis without necrosis or infection: Secondary | ICD-10-CM | POA: Diagnosis not present

## 2019-01-28 DIAGNOSIS — E119 Type 2 diabetes mellitus without complications: Secondary | ICD-10-CM

## 2019-01-28 DIAGNOSIS — Z794 Long term (current) use of insulin: Secondary | ICD-10-CM | POA: Diagnosis not present

## 2019-01-28 LAB — COMPREHENSIVE METABOLIC PANEL
ALT: 96 U/L — ABNORMAL HIGH (ref 0–44)
AST: 76 U/L — AB (ref 15–41)
Albumin: 3.7 g/dL (ref 3.5–5.0)
Alkaline Phosphatase: 90 U/L (ref 38–126)
Anion gap: 7 (ref 5–15)
BUN: 10 mg/dL (ref 6–20)
CO2: 23 mmol/L (ref 22–32)
Calcium: 8.3 mg/dL — ABNORMAL LOW (ref 8.9–10.3)
Chloride: 110 mmol/L (ref 98–111)
Creatinine, Ser: 0.84 mg/dL (ref 0.61–1.24)
GFR calc Af Amer: >60 mL/min (ref >60)
GFR calc non Af Amer: >60 mL/min (ref >60)
GLUCOSE: 115 mg/dL — AB (ref 70–99)
Potassium: 4 mmol/L (ref 3.5–5.1)
Sodium: 140 mmol/L (ref 135–145)
Total Bilirubin: 0.2 mg/dL — ABNORMAL LOW (ref 0.3–1.2)
Total Protein: 5.9 g/dL — ABNORMAL LOW (ref 6.5–8.1)

## 2019-01-28 LAB — CBC
HCT: 34.9 % — ABNORMAL LOW (ref 39.0–52.0)
Hemoglobin: 11 g/dL — ABNORMAL LOW (ref 13.0–17.0)
MCH: 26.3 pg (ref 26.0–34.0)
MCHC: 31.5 g/dL (ref 30.0–36.0)
MCV: 83.3 fL (ref 80.0–100.0)
Platelets: 203 10*3/uL (ref 150–400)
RBC: 4.19 MIL/uL — ABNORMAL LOW (ref 4.22–5.81)
RDW: 12.9 % (ref 11.5–15.5)
WBC: 4.2 10*3/uL (ref 4.0–10.5)
nRBC: 0 % (ref 0.0–0.2)

## 2019-01-28 LAB — MAGNESIUM: Magnesium: 2.1 mg/dL (ref 1.7–2.4)

## 2019-01-28 LAB — GLUCOSE, CAPILLARY
Glucose-Capillary: 118 mg/dL — ABNORMAL HIGH (ref 70–99)
Glucose-Capillary: 104 mg/dL — ABNORMAL HIGH (ref 70–99)
Glucose-Capillary: 170 mg/dL — ABNORMAL HIGH (ref 70–99)

## 2019-01-28 LAB — LIPASE, BLOOD: Lipase: 29 U/L (ref 11–51)

## 2019-01-28 LAB — HIV ANTIBODY (ROUTINE TESTING W REFLEX): HIV Screen 4th Generation wRfx: NONREACTIVE

## 2019-01-28 MED ORDER — HYDROCODONE-ACETAMINOPHEN 5-325 MG PO TABS: 1.0000 | ORAL_TABLET | ORAL | 0 refills | Status: DC | PRN

## 2019-01-28 MED ORDER — OMEPRAZOLE 40 MG PO CPDR: 40.0000 mg | DELAYED_RELEASE_CAPSULE | Freq: Every day | ORAL | 3 refills | Status: DC

## 2019-01-28 MED ORDER — ONDANSETRON 8 MG PO TBDP: 8.0000 mg | ORAL_TABLET | Freq: Three times a day (TID) | ORAL | 4 refills | Status: DC | PRN

## 2019-01-28 MED ORDER — ACETAMINOPHEN 325 MG PO TABS: 650.0000 mg | ORAL_TABLET | Freq: Four times a day (QID) | ORAL | 0 refills | Status: DC | PRN

## 2019-01-28 NOTE — Discharge Instructions (Signed)
1) Avoid ibuprofen/Advil/Aleve/Motrin/Goody Powders/Naproxen/BC powders/Meloxicam/Diclofenac/Indomethacin and other Nonsteroidal anti-inflammatory medications as these will make you more likely to bleed and can cause stomach ulcers, can also cause Kidney problems.   2) diabetic diet advised  3) follow-up with gastroenterologist or primary care physician as needed  4) recheck liver function test with your primary care physician or gastroenterologist in 1 to 2 weeks

## 2019-01-28 NOTE — Discharge Summary (Signed)
Dennis Yang, is a 33 y.o. male  DOB January 31, 1986  MRN [id number].  Admission date:  01/27/2019  Admitting Physician  Pratik Hoover Brunette, DO  Discharge Date:  01/28/2019   Primary MD  Patient, [name]  Recommendations for primary care physician for things to follow:   1) Avoid ibuprofen/Advil/Aleve/Motrin/Goody Powders/Naproxen/BC powders/Meloxicam/Diclofenac/Indomethacin and other Nonsteroidal anti-inflammatory medications as these will make you more likely to bleed and can cause stomach ulcers, can also cause Kidney problems.   2) diabetic diet advised  3) follow-up with gastroenterologist or primary care physician as needed  4) recheck liver function test with your primary care physician or gastroenterologist in 1 to 2 weeks  Admission Diagnosis  Normochromic normocytic anemia [D64.9] Elevated AST (SGOT) [R74.0] Acute pancreatitis, unspecified complication status, unspecified pancreatitis type [K85.90]  Discharge Diagnosis  Normochromic normocytic anemia [D64.9] Elevated AST (SGOT) [R74.0] Acute pancreatitis, unspecified complication status, unspecified pancreatitis type [K85.90]    Principal Problem:   Acute pancreatitis Active Problems:   Bipolar 1 disorder (HCC)   Depression with anxiety   Type 2 diabetes mellitus without complication (HCC)      Past Medical History:  Diagnosis Date  . Anxiety   . Back pain   . Bipolar 1 disorder (HCC)   . Depression   . Diabetes mellitus without complication (HCC)   . Neck pain   . Osteoarthritis   . Schizoaffective disorder [facility])     Past Surgical History:  Procedure Laterality Date  . CHOLECYSTECTOMY    . ELBOW FRACTURE SURGERY    . HERNIA REPAIR    . LITHOTRIPSY    . NASAL SINUS SURGERY         HPI  from the history and physical done on the day of admission:    Patient coming from: Home  Chief Complaint: Epigastric abdominal  pain  HPI: Dennis Yang is a 33 y.o. male with medical history significant for bipolar disorder, type 2 diabetes, depression/anxiety, and recurrent pancreatitis who presented to the ED with epigastric and left upper quadrant abdominal pain which started at approximately 11 AM yesterday.  He states he had radiation of pain to his back with associated nausea and vomiting and rated the pain 9/10.  This is similar to previous episodes of pancreatitis that he has had before with prior admissions to [facility] as well as [facility] where he sees gastroenterology.  He has not had any alcohol for the last 2 years and has not had any other unusual food intake.  He is having bowel movements that are not dark or bloody and is passing flatus.   ED Course: Vital signs are stable and laboratory data with lipase of 110 noted.  LFTs are otherwise normal.  CT of the abdomen pelvis with contrast does demonstrate inflammation suggestive of acute pancreatitis to the head of the pancreas along with increase in the caliber of the proximal small bowel with tapering off suggestive of possible ileus and/or bowel obstruction.  Patient has been given 2 L  IV fluid bolus and remains on aggressive IV fluid resuscitation.  He has been given multiple doses of IV morphine with minimal pain relief, but states that Dilaudid is not helping.  He denies any significant nausea or vomiting at this time after Zofran administration.     Hospital Course:     1)Recurrent Pancreatitis----as per patient prior work-up failed to reveal etiology, lipase is down to 29 from 110, tolerating oral intake, no further emesis--- okay to discharge home on PPI, PRN Zofran  2)DM2--- doing home regimen follow-up with PCP  3) elevated LFTs--- AST 76, ALT is 96, total bili 0.2,----- repeat LFTs in 1 to 2 weeks with PCP or gastroenterologist   4)NeuroPsych--- patient with bipolar 1 disorder/depression with anxiety-----stable at this time,  continue home regimen  Discharge Condition: Stable,  Follow UP  Follow-up Information    Schedule an appointment as soon as possible for a visit  with [facility].   Contact information: [address] [place], [place] [address] [phone]          Diet and Activity recommendation:  As advised  Discharge Instructions     Discharge Instructions    Call MD for:  difficulty breathing, headache or visual disturbances   Complete by:  As directed    Call MD for:  persistant dizziness or light-headedness   Complete by:  As directed    Call MD for:  persistant nausea and vomiting   Complete by:  As directed    Call MD for:  severe uncontrolled pain   Complete by:  As directed    Call MD for:  temperature >100.4   Complete by:  As directed    Diet - low sodium heart healthy   Complete by:  As directed    Diet Carb Modified   Complete by:  As directed    Discharge instructions   Complete by:  As directed    1) Avoid ibuprofen/Advil/Aleve/Motrin/Goody Powders/Naproxen/BC powders/Meloxicam/Diclofenac/Indomethacin and other Nonsteroidal anti-inflammatory medications as these will make you more likely to bleed and can cause stomach ulcers, can also cause Kidney problems.   2) diabetic diet advised  3) follow-up with gastroenterologist or primary care physician as needed   Increase activity slowly   Complete by:  As directed         Discharge Medications     Allergies as of 01/28/2019      Reactions   Penicillins Anaphylaxis   Did it involve swelling of the face/tongue/throat, SOB, or low BP? Yes Did it involve sudden or severe rash/hives, skin peeling, or any reaction on the inside of your mouth or nose? Yes Did you need to seek medical attention at a hospital or doctor's office? Yes When did it last happen? If all above answers are "NO", may proceed with cephalosporin use.   Ciprofloxacin Hives      Medication  List    TAKE these medications   acetaminophen 325 MG tablet Commonly known as:  TYLENOL Take 2 tablets (650 mg total) by mouth every 6 (six) hours as needed for mild pain (or Fever >/= 101).   ARIPiprazole 10 MG tablet Commonly known as:  ABILIFY Take 10 mg by mouth daily.   ATROVENT HFA 17 MCG/ACT inhaler Generic drug:  ipratropium Inhale 1 puff into the lungs every 4 (four) hours as needed for wheezing.   CARAFATE 1 g tablet Generic drug:  sucralfate Take 1 g by mouth 2 (two) times daily.  celecoxib 100 MG capsule Commonly known as:  CELEBREX Take 100 mg by mouth 2 (two) times daily.   divalproex 250 MG DR tablet Commonly known as:  DEPAKOTE Take 250 mg by mouth 2 (two) times daily.   docusate sodium 100 MG capsule Commonly known as:  COLACE Take 100 mg by mouth daily.   EMGALITY 120 MG/ML Soaj Generic drug:  Galcanezumab-gnlm Inject 1 Dose into the muscle every 30 (thirty) days.   escitalopram 20 MG tablet Commonly known as:  LEXAPRO Take 20 mg by mouth daily.   FARXIGA 5 MG Tabs tablet Generic drug:  dapagliflozin propanediol Take 5 mg by mouth daily.   fluticasone 50 MCG/ACT nasal spray Commonly known as:  FLONASE Place 1 spray into both nostrils daily.   gabapentin 800 MG tablet Commonly known as:  NEURONTIN Take 800 mg by mouth 3 (three) times daily.   glipiZIDE 10 MG tablet Commonly known as:  GLUCOTROL Take 10 mg by mouth 2 (two) times daily.   HYDROcodone-acetaminophen 5-325 MG tablet Commonly known as:  NORCO Take 1 tablet by mouth every 4 (four) hours as needed for moderate pain or severe pain.   hydrOXYzine 50 MG tablet Commonly known as:  ATARAX/VISTARIL Take 50 mg by mouth 4 (four) times daily.   levocetirizine 5 MG tablet Commonly known as:  XYZAL Take 5 mg by mouth every evening.   metaxalone 800 MG tablet Commonly known as:  SKELAXIN Take 800 mg by mouth 2 (two) times daily.   montelukast 10 MG tablet Commonly known as:   SINGULAIR Take 10 mg by mouth daily.   omeprazole 40 MG capsule Commonly known as:  PRILOSEC Take 1 capsule (40 mg total) by mouth daily.   ondansetron 8 MG disintegrating tablet Commonly known as:  ZOFRAN-ODT Take 1 tablet (8 mg total) by mouth every 8 (eight) hours as needed for nausea or vomiting. What changed:  reasons to take this   phentermine 37.5 MG capsule Take 37.5 mg by mouth every morning.   prazosin 5 MG capsule Commonly known as:  MINIPRESS Take 5 mg by mouth at bedtime.   PROAIR HFA 108 (90 Base) MCG/ACT inhaler Generic drug:  albuterol Inhale 1-2 puffs into the lungs every 4 (four) hours as needed for wheezing.   propranolol 40 MG tablet Commonly known as:  INDERAL Take 40 mg by mouth 2 (two) times daily.   simvastatin 20 MG tablet Commonly known as:  ZOCOR Take 20 mg by mouth daily at 6 PM.   Vitamin D3 1.25 MG (50000 UT) Caps Take 1 capsule by mouth. 3 times a week   XIIDRA 5 % Soln Generic drug:  Lifitegrast Place 2 drops into both eyes daily.       Major procedures and Radiology Reports - PLEASE review detailed and final reports for all details, in brief -    Ct Abdomen Pelvis W Contrast  Result Date: 01/27/2019 CLINICAL DATA:  Left upper abdominal pain. EXAM: CT ABDOMEN AND PELVIS WITH CONTRAST TECHNIQUE: Multidetector CT imaging of the abdomen and pelvis was performed using the standard protocol following bolus administration of intravenous contrast. CONTRAST:  30mL ISOVUE-300 IOPAMIDOL (ISOVUE-300) INJECTION 61%, 100mL OMNIPAQUE IOHEXOL 300 MG/ML SOLN COMPARISON:  10/04/2017 FINDINGS: Lower chest: No acute abnormality. Scarring identified within both lung bases. Hepatobiliary: No focal liver abnormality. Previous cholecystectomy. No biliary dilatation. Pancreas: Calcifications within the head of pancreas are identified, new from 10/04/2017. There is mild edema surrounding the head of pancreas and adjacent soft tissues.  No significant free fluid. No  main duct dilatation identified. Spleen: Normal in size without focal abnormality. Adrenals/Urinary Tract: The adrenal glands appear normal. Two right renal calculi identified measuring up to 4 mm. No mass or hydronephrosis identified bilaterally. The urinary bladder appears normal. Stomach/Bowel: The stomach appears normal. Focal area of luminal narrowing of the descending duodenum adjacent to head of pancreas is noted. Proximal and mid small bowel loops are increased in caliber with air-fluid levels. These measure up to 3.1 cm. Transition to normal caliber distal small bowel is noted within the right abdomen, image 45/2 and image 42/8. Within the left upper quadrant of the abdomen there is a segment of proximal small bowel which exhibits abnormal wall thickening measuring up to 0.9 cm. Fecalization enteric contents within the terminal ileum is identified, image 44/8 which may reflect decreased bowel transit. Unremarkable appearance of the colon. Vascular/Lymphatic: No significant vascular findings are present. No enlarged abdominal or pelvic lymph nodes. Reproductive: Status post hysterectomy. No adnexal masses. Other: No free fluid or fluid collections identified. Musculoskeletal: No acute or significant osseous findings. IMPRESSION: 1. There is mild edema and inflammation involving the head of pancreas. Calcifications within the head of pancreas are new from 11/06/2017. Findings are suspicious for acute on chronic pancreatitis. No significant free fluid, pancreatic necrosis or pseudocyst. 2. There is abnormal increase caliber of the proximal and mid small bowel loops with a transition to decreased distal small bowel. Imaging findings may reflect small-bowel ileus secondary to pancreatic inflammation. Small bowel obstruction is not excluded. 3. Focal segment of proximal small bowel within the left upper quadrant of the abdomen exhibits wall thickening. This may reflect secondary inflammation from pancreatitis or  nonspecific infectious enteritis. Clinical correlation advised. 4. Nonobstructing right renal calculi. Electronically Signed   By: Signa Kell M.D.   On: 01/27/2019 10:34    Micro Results    Recent Results (from the past 240 hour(s))  MRSA PCR Screening     Status: None   Collection Time: 01/27/19  5:24 PM  Result Value Ref Range Status   MRSA by PCR NEGATIVE NEGATIVE Final    Comment:        The GeneXpert MRSA Assay (FDA approved for NASAL specimens only), is one component of a comprehensive MRSA colonization surveillance program. It is not intended to diagnose MRSA infection nor to guide or monitor treatment for MRSA infections. Performed at [facility], [address], [place], [place] [address]     Today   Subjective    Dennis Yang today has no complaints, wife at bedside, eating and drinking okay, no emesis, no fevers, had BM with oatmeal consistency          Patient has been seen and examined prior to discharge   Objective   Blood pressure 111/68, pulse 65, temperature (!) 97.5 F (36.4 C), temperature source Oral, resp. rate 11, height 6' (1.829 m), weight 97.8 kg, SpO2 94 %.   Intake/Output Summary (Last 24 hours) at 01/28/2019 1120 Last data filed at 01/28/2019 0900 Gross per 24 hour  Intake 3463.66 ml  Output 1450 ml  Net 2013.66 ml    Exam Gen:- Awake Alert, no acute distress , obese HEENT:- [place].AT, No sclera icterus Neck-Supple Neck,No JVD,.  Lungs-  CTAB , good air movement bilaterally  CV- S1, S2 normal, regular Abd-  +ve B.Sounds, Abd Soft, overall improved epigastric and left upper quadrant area tenderness, no rebound no guarding Extremity/Skin:- No  edema,   good pulses Psych-affect  is appropriate, oriented x3 Neuro-no new focal deficits, no tremors    Data Review   CBC w Diff:  Lab Results  Component Value Date   WBC 4.2 01/28/2019   HGB 11.0 (L) 01/28/2019   HCT 34.9 (L) 01/28/2019   PLT 203 01/28/2019   LYMPHOPCT 43 01/26/2019    MONOPCT 9 01/26/2019   EOSPCT 2 01/26/2019   BASOPCT 1 01/26/2019    CMP:  Lab Results  Component Value Date   NA 140 01/28/2019   K 4.0 01/28/2019   CL 110 01/28/2019   CO2 23 01/28/2019   BUN 10 01/28/2019   CREATININE 0.84 01/28/2019   PROT 5.9 (L) 01/28/2019   ALBUMIN 3.7 01/28/2019   BILITOT 0.2 (L) 01/28/2019   ALKPHOS 90 01/28/2019   AST 76 (H) 01/28/2019   ALT 96 (H) 01/28/2019  .   Total Discharge time is about 33 minutes  Shon Hale M.D on 01/28/2019 at 11:20 AM  Go to [email or web address] -  for contact info  [facility] - Office  [phone]

## 2019-03-11 ENCOUNTER — Emergency Department (HOSPITAL_COMMUNITY)
Admission: EM | Admit: 2019-03-11 | Discharge: 2019-03-11 | Disposition: A | Discharge status: Home / Self Care | Payer: Medicaid - Out of State | Attending: Emergency Medicine | Admitting: Emergency Medicine

## 2019-03-11 ENCOUNTER — Emergency Department (HOSPITAL_COMMUNITY): Payer: Medicaid - Out of State

## 2019-03-11 ENCOUNTER — Encounter (HOSPITAL_COMMUNITY): Payer: Self-pay | Admitting: Emergency Medicine

## 2019-03-11 ENCOUNTER — Other Ambulatory Visit: Payer: Self-pay

## 2019-03-11 DIAGNOSIS — Z87891 Personal history of nicotine dependence: Secondary | ICD-10-CM | POA: Insufficient documentation

## 2019-03-11 DIAGNOSIS — R1030 Lower abdominal pain, unspecified: Secondary | ICD-10-CM | POA: Diagnosis not present

## 2019-03-11 DIAGNOSIS — Z7984 Long term (current) use of oral hypoglycemic drugs: Secondary | ICD-10-CM | POA: Insufficient documentation

## 2019-03-11 DIAGNOSIS — Z79899 Other long term (current) drug therapy: Secondary | ICD-10-CM | POA: Diagnosis not present

## 2019-03-11 DIAGNOSIS — R109 Unspecified abdominal pain: Secondary | ICD-10-CM | POA: Diagnosis present

## 2019-03-11 DIAGNOSIS — E1165 Type 2 diabetes mellitus with hyperglycemia: Secondary | ICD-10-CM | POA: Diagnosis not present

## 2019-03-11 LAB — COMPREHENSIVE METABOLIC PANEL
ALT: 19 U/L (ref 0–44)
AST: 18 U/L (ref 15–41)
Albumin: 3.8 g/dL (ref 3.5–5.0)
Alkaline Phosphatase: 97 U/L (ref 38–126)
Anion gap: 12 (ref 5–15)
BUN: 25 mg/dL — ABNORMAL HIGH (ref 6–20)
CO2: 22 mmol/L (ref 22–32)
Calcium: 8.7 mg/dL — ABNORMAL LOW (ref 8.9–10.3)
Chloride: 105 mmol/L (ref 98–111)
Creatinine, Ser: 1 mg/dL (ref 0.61–1.24)
GFR calc Af Amer: >60 mL/min (ref >60)
GFR calc non Af Amer: >60 mL/min (ref >60)
Glucose, Bld: 271 mg/dL — ABNORMAL HIGH (ref 70–99)
Potassium: 4.1 mmol/L (ref 3.5–5.1)
Sodium: 139 mmol/L (ref 135–145)
Total Bilirubin: 0.2 mg/dL — ABNORMAL LOW (ref 0.3–1.2)
Total Protein: 6.4 g/dL — ABNORMAL LOW (ref 6.5–8.1)

## 2019-03-11 LAB — CBC WITH DIFFERENTIAL/PLATELET
Abs Immature Granulocytes: 0.16 10*3/uL — ABNORMAL HIGH (ref 0.00–0.07)
Basophils Absolute: 0.1 10*3/uL (ref 0.0–0.1)
Basophils Relative: 1 %
Eosinophils Absolute: 0 10*3/uL (ref 0.0–0.5)
Eosinophils Relative: 0 %
HCT: 37 % — ABNORMAL LOW (ref 39.0–52.0)
Hemoglobin: 11.9 g/dL — ABNORMAL LOW (ref 13.0–17.0)
Immature Granulocytes: 2 %
Lymphocytes Relative: 38 %
Lymphs Abs: 3.4 10*3/uL (ref 0.7–4.0)
MCH: 26 pg (ref 26.0–34.0)
MCHC: 32.2 g/dL (ref 30.0–36.0)
MCV: 80.8 fL (ref 80.0–100.0)
Monocytes Absolute: 0.8 10*3/uL (ref 0.1–1.0)
Monocytes Relative: 9 %
Neutro Abs: 4.6 10*3/uL (ref 1.7–7.7)
Neutrophils Relative %: 50 %
Platelets: 206 10*3/uL (ref 150–400)
RBC: 4.58 MIL/uL (ref 4.22–5.81)
RDW: 14 % (ref 11.5–15.5)
WBC: 9 10*3/uL (ref 4.0–10.5)
nRBC: 0 % (ref 0.0–0.2)

## 2019-03-11 LAB — CBG MONITORING, ED
Glucose-Capillary: 279 mg/dL — ABNORMAL HIGH (ref 70–99)
Glucose-Capillary: 207 mg/dL — ABNORMAL HIGH (ref 70–99)

## 2019-03-11 LAB — LIPASE, BLOOD: Lipase: 30 U/L (ref 11–51)

## 2019-03-11 MED ORDER — HYDROCODONE-ACETAMINOPHEN 5-325 MG PO TABS: 1.0000 | ORAL_TABLET | Freq: Four times a day (QID) | ORAL | 0 refills | Status: DC | PRN

## 2019-03-11 MED ORDER — ONDANSETRON 4 MG PO TBDP: ORAL_TABLET | ORAL | 0 refills | Status: DC

## 2019-03-11 MED ORDER — KETOROLAC TROMETHAMINE 30 MG/ML IJ SOLN
30.0000 mg | Freq: Once | INTRAMUSCULAR | Status: AC
  Administered 2019-03-11: 30 mg via INTRAVENOUS
  Filled 2019-03-11 (×2): qty 1

## 2019-03-11 MED ORDER — HYDROMORPHONE HCL 1 MG/ML IJ SOLN
1.0000 mg | Freq: Once | INTRAMUSCULAR | Status: AC
  Administered 2019-03-11: 1 mg via INTRAVENOUS
  Filled 2019-03-11: qty 1

## 2019-03-11 MED ORDER — SODIUM CHLORIDE 0.9 % IV BOLUS
1000.0000 mL | Freq: Once | INTRAVENOUS | Status: AC
  Administered 2019-03-11: 1000 mL via INTRAVENOUS

## 2019-03-11 MED ORDER — IOHEXOL 300 MG/ML  SOLN
100.0000 mL | Freq: Once | INTRAMUSCULAR | Status: AC | PRN
  Administered 2019-03-11: 100 mL via INTRAVENOUS

## 2019-03-11 MED ORDER — ONDANSETRON HCL 4 MG/2ML IJ SOLN
4.0000 mg | Freq: Once | INTRAMUSCULAR | Status: AC
  Administered 2019-03-11: 4 mg via INTRAVENOUS
  Filled 2019-03-11 (×2): qty 2

## 2019-03-11 NOTE — ED Triage Notes (Signed)
Pt states that his sugar has been in the 400's and his abd is hurting him

## 2019-03-11 NOTE — Discharge Instructions (Signed)
Follow-up with your family doctor next week for recheck.  Drink liquids only for the next 24 hours.  If you do not have a family doctor then follow-up with a gastroenterologist.

## 2019-03-11 NOTE — ED Provider Notes (Signed)
[facility] EMERGENCY DEPARTMENT Provider Note   CSN: [id number] Arrival date & time: 03/11/19  1107    History   Chief Complaint Chief Complaint  Patient presents with  . Abdominal Pain  . Hyperglycemia    HPI Dennis Yang is a 33 y.o. male.     Patient complains of abdominal pain.  And his sugars have been elevated.  Patient has a history of pancreatitis and states it feels like his pancreas is hurting  The history is provided by the patient. No language interpreter was used.  Abdominal Pain  Pain location:  Generalized Pain quality: aching   Pain radiates to:  Does not radiate Pain severity:  Moderate Onset quality:  Sudden Timing:  Constant Progression:  Waxing and waning Chronicity:  New Context: not alcohol use   Relieved by:  Nothing Worsened by:  Nothing Ineffective treatments:  None tried Associated symptoms: no chest pain, no cough, no diarrhea, no fatigue and no hematuria   Hyperglycemia  Associated symptoms: abdominal pain   Associated symptoms: no chest pain and no fatigue     Past Medical History:  Diagnosis Date  . Anxiety   . Back pain   . Bipolar 1 disorder (HCC)   . Depression   . Diabetes mellitus without complication (HCC)   . Neck pain   . Osteoarthritis   . Schizoaffective disorder [facility])     Patient Active Problem List   Diagnosis Date Noted  . Acute pancreatitis 01/27/2019  . Bipolar 1 disorder (HCC) 01/27/2019  . Depression with anxiety 01/27/2019  . Type 2 diabetes mellitus without complication (HCC) 01/27/2019    Past Surgical History:  Procedure Laterality Date  . CHOLECYSTECTOMY    . ELBOW FRACTURE SURGERY    . HERNIA REPAIR    . LITHOTRIPSY    . NASAL SINUS SURGERY          Home Medications    Prior to Admission medications   Medication Sig Start Date End Date Taking? Authorizing Provider  acetaminophen (TYLENOL) 325 MG tablet Take 2 tablets (650 mg total) by mouth every 6 (six) hours as needed for mild pain (or  Fever >/= 101). 01/28/19   Shon Hale, MD  ARIPiprazole (ABILIFY) 10 MG tablet Take 10 mg by mouth daily.  01/22/19   [provider]  ATROVENT HFA 17 MCG/ACT inhaler Inhale 1 puff into the lungs every 4 (four) hours as needed for wheezing.  01/16/19   [provider]  CARAFATE 1 g tablet Take 1 g by mouth 2 (two) times daily.  12/20/18   [provider]  celecoxib (CELEBREX) 100 MG capsule Take 100 mg by mouth 2 (two) times daily.  01/12/19   [provider]  Cholecalciferol (VITAMIN D3) 1.25 MG (50000 UT) CAPS Take 1 capsule by mouth. 3 times a week 12/24/18   [provider]  divalproex (DEPAKOTE) 250 MG DR tablet Take 250 mg by mouth 2 (two) times daily.  01/22/19   [provider]  docusate sodium (COLACE) 100 MG capsule Take 100 mg by mouth daily.  01/01/19   [provider]  EMGALITY 120 MG/ML SOAJ Inject 1 Dose into the muscle every 30 (thirty) days.  11/24/18   [provider]  escitalopram (LEXAPRO) 20 MG tablet Take 20 mg by mouth daily.  01/22/19   [provider]  FARXIGA 5 MG TABS tablet Take 5 mg by mouth daily.  01/07/19   [provider]  fluticasone (FLONASE) 50 MCG/ACT nasal  spray Place 1 spray into both nostrils daily.  01/20/19   [provider]  gabapentin (NEURONTIN) 800 MG tablet Take 800 mg by mouth 3 (three) times daily. 01/22/19   [provider]  glipiZIDE (GLUCOTROL) 10 MG tablet Take 10 mg by mouth 2 (two) times daily. 01/19/19   [provider]  HYDROcodone-acetaminophen (NORCO) 5-325 MG tablet Take 1 tablet by mouth every 4 (four) hours as needed for moderate pain or severe pain. 01/28/19   Shon Hale, MD  hydrOXYzine (ATARAX/VISTARIL) 50 MG tablet Take 50 mg by mouth 4 (four) times daily. 01/22/19   [provider]  levocetirizine (XYZAL) 5 MG tablet Take 5 mg by mouth every evening.  01/15/19   [provider]  metaxalone (SKELAXIN) 800 MG  tablet Take 800 mg by mouth 2 (two) times daily.  01/12/19   [provider]  montelukast (SINGULAIR) 10 MG tablet Take 10 mg by mouth daily.  01/12/19   [provider]  omeprazole (PRILOSEC) 40 MG capsule Take 1 capsule (40 mg total) by mouth daily. 01/28/19   Shon Hale, MD  ondansetron (ZOFRAN-ODT) 8 MG disintegrating tablet Take 1 tablet (8 mg total) by mouth every 8 (eight) hours as needed for nausea or vomiting. 01/28/19   Shon Hale, MD  phentermine 37.5 MG capsule Take 37.5 mg by mouth every morning.    [provider]  prazosin (MINIPRESS) 5 MG capsule Take 5 mg by mouth at bedtime.  01/22/19   [provider]  PROAIR HFA 108 (90 Base) MCG/ACT inhaler Inhale 1-2 puffs into the lungs every 4 (four) hours as needed for wheezing.  01/17/19   [provider]  propranolol (INDERAL) 40 MG tablet Take 40 mg by mouth 2 (two) times daily.  01/12/19   [provider]  simvastatin (ZOCOR) 20 MG tablet Take 20 mg by mouth daily at 6 PM.  12/25/18   [provider]  XIIDRA 5 % SOLN Place 2 drops into both eyes daily.  01/21/19   [provider]    Family History No family history on file.  Social History Social History   Tobacco Use  . Smoking status: Former Games developer  . Smokeless tobacco: Never Used  Substance Use Topics  . Alcohol use: Not Currently  . Drug use: Not Currently     Allergies   Penicillins and Ciprofloxacin   Review of Systems Review of Systems  Constitutional: Negative for appetite change and fatigue.  HENT: Negative for congestion, ear discharge and sinus pressure.   Eyes: Negative for discharge.  Respiratory: Negative for cough.   Cardiovascular: Negative for chest pain.  Gastrointestinal: Positive for abdominal pain. Negative for diarrhea.  Genitourinary: Negative for frequency and hematuria.  Musculoskeletal: Negative for back pain.  Skin: Negative for rash.  Neurological: Negative for  seizures and headaches.  Psychiatric/Behavioral: Negative for hallucinations.     Physical Exam Updated Vital Signs BP 115/76 (BP Location: Right Arm)   Pulse 70   Temp 97.8 F (36.6 C) (Oral)   Resp 14   Ht 6' (1.829 m)   Wt 102.5 kg   SpO2 98%   BMI 30.65 kg/m   Physical Exam Vitals signs and nursing note reviewed.  Constitutional:      Appearance: He is well-developed.  HENT:     Head: Normocephalic.     Nose: Nose normal.  Eyes:     General: No scleral icterus.    Conjunctiva/sclera: Conjunctivae normal.  Neck:  Musculoskeletal: Neck supple.     Thyroid: No thyromegaly.  Cardiovascular:     Rate and Rhythm: Normal rate and regular rhythm.     Heart sounds: No murmur. No friction rub. No gallop.   Pulmonary:     Breath sounds: No stridor. No wheezing or rales.  Chest:     Chest wall: No tenderness.  Abdominal:     General: There is no distension.     Tenderness: There is abdominal tenderness. There is no rebound.  Musculoskeletal: Normal range of motion.  Lymphadenopathy:     Cervical: No cervical adenopathy.  Skin:    Findings: No erythema or rash.  Neurological:     Mental Status: He is oriented to person, place, and time.     Motor: No abnormal muscle tone.     Coordination: Coordination normal.  Psychiatric:        Behavior: Behavior normal.      ED Treatments / Results  Labs (all labs ordered are listed, but only abnormal results are displayed) Labs Reviewed  CBC WITH DIFFERENTIAL/PLATELET - Abnormal; Notable for the following components:      Result Value   Hemoglobin 11.9 (*)    HCT 37.0 (*)    Abs Immature Granulocytes 0.16 (*)    All other components within normal limits  COMPREHENSIVE METABOLIC PANEL - Abnormal; Notable for the following components:   Glucose, Bld 271 (*)    BUN 25 (*)    Calcium 8.7 (*)    Total Protein 6.4 (*)    Total Bilirubin 0.2 (*)    All other components within normal limits  CBG MONITORING, ED -  Abnormal; Notable for the following components:   Glucose-Capillary 279 (*)    All other components within normal limits  CBG MONITORING, ED - Abnormal; Notable for the following components:   Glucose-Capillary 207 (*)    All other components within normal limits  LIPASE, BLOOD    EKG None  Radiology No results found.  Procedures Procedures (including critical care time)  Medications Ordered in ED Medications  sodium chloride 0.9 % bolus 1,000 mL (1,000 mLs Intravenous New Bag/Given 03/11/19 1322)  ondansetron (ZOFRAN) injection 4 mg (4 mg Intravenous Given 03/11/19 1321)  ketorolac (TORADOL) 30 MG/ML injection 30 mg (30 mg Intravenous Given 03/11/19 1321)  HYDROmorphone (DILAUDID) injection 1 mg (1 mg Intravenous Given 03/11/19 1440)     Initial Impression / Assessment and Plan / ED Course  I have reviewed the triage vital signs and the nursing notes.  Pertinent labs & imaging results that were available during my care of the patient were reviewed by me and considered in my medical decision making (see chart for details).        Patient improved with fluids and pain medicine.  Patient will get a CT abdomen for further evaluation  Final Clinical Impressions(s) / ED Diagnoses   Final diagnoses:  None    ED Discharge Orders    None       Bethann Berkshire, MD 03/11/19 1541

## 2019-03-11 NOTE — ED Provider Notes (Signed)
Pt received at sign out with CT A/P pending. See previous EDP note for full HPI/H&P/MDM. CT reassuring. VS remain stable. No clear indication for admission at this time. Pt states he is ready to go home now. Dx and testing d/w pt.  Questions answered.  Verb understanding, agreeable to d/c home with outpt f/u.    BP 114/68 (BP Location: Left Arm)   Pulse 63   Temp 97.8 F (36.6 C) (Oral)   Resp 16   Ht 6' (1.829 m)   Wt 102.5 kg   SpO2 98%   BMI 30.65 kg/m   Results for orders placed or performed during the hospital encounter of 03/11/19  CBC with Differential/Platelet  Result Value Ref Range   WBC 9.0 4.0 - 10.5 K/uL   RBC 4.58 4.22 - 5.81 MIL/uL   Hemoglobin 11.9 (L) 13.0 - 17.0 g/dL   HCT 11.9 (L) 41.7 - 40.8 %   MCV 80.8 80.0 - 100.0 fL   MCH 26.0 26.0 - 34.0 pg   MCHC 32.2 30.0 - 36.0 g/dL   RDW 14.4 81.8 - 56.3 %   Platelets 206 150 - 400 K/uL   nRBC 0.0 0.0 - 0.2 %   Neutrophils Relative % 50 %   Neutro Abs 4.6 1.7 - 7.7 K/uL   Lymphocytes Relative 38 %   Lymphs Abs 3.4 0.7 - 4.0 K/uL   Monocytes Relative 9 %   Monocytes Absolute 0.8 0.1 - 1.0 K/uL   Eosinophils Relative 0 %   Eosinophils Absolute 0.0 0.0 - 0.5 K/uL   Basophils Relative 1 %   Basophils Absolute 0.1 0.0 - 0.1 K/uL   Immature Granulocytes 2 %   Abs Immature Granulocytes 0.16 (H) 0.00 - 0.07 K/uL  Comprehensive metabolic panel  Result Value Ref Range   Sodium 139 135 - 145 mmol/L   Potassium 4.1 3.5 - 5.1 mmol/L   Chloride 105 98 - 111 mmol/L   CO2 22 22 - 32 mmol/L   Glucose, Bld 271 (H) 70 - 99 mg/dL   BUN 25 (H) 6 - 20 mg/dL   Creatinine, Ser 1.49 0.61 - 1.24 mg/dL   Calcium 8.7 (L) 8.9 - 10.3 mg/dL   Total Protein 6.4 (L) 6.5 - 8.1 g/dL   Albumin 3.8 3.5 - 5.0 g/dL   AST 18 15 - 41 U/L   ALT 19 0 - 44 U/L   Alkaline Phosphatase 97 38 - 126 U/L   Total Bilirubin 0.2 (L) 0.3 - 1.2 mg/dL   GFR calc non Af Amer >60 >60 mL/min   GFR calc Af Amer >60 >60 mL/min   Anion gap 12 5 - 15   Lipase, blood  Result Value Ref Range   Lipase 30 11 - 51 U/L  CBG monitoring, ED  Result Value Ref Range   Glucose-Capillary 279 (H) 70 - 99 mg/dL  CBG monitoring, ED  Result Value Ref Range   Glucose-Capillary 207 (H) 70 - 99 mg/dL   Ct Abdomen Pelvis W Contrast Result Date: 03/11/2019 CLINICAL DATA:  Initial evaluation for acute abdominal distension, left lower quadrant pain for 2 days, nausea. History of prior right inguinal hernia repair, cholecystectomy. EXAM: CT ABDOMEN AND PELVIS WITH CONTRAST TECHNIQUE: Multidetector CT imaging of the abdomen and pelvis was performed using the standard protocol following bolus administration of intravenous contrast. CONTRAST:  OMNIPAQUE IOHEXOL 300 MG/ML  SOLN COMPARISON:  Prior CT from 02/20/2019. FINDINGS: Lower chest: Scattered subsegmental atelectatic changes seen dependently within the visualized  lung bases. Visualized lungs are otherwise clear. Hepatobiliary: Diffuse hypoattenuation liver suggestive of steatosis. Liver otherwise unremarkable. Gallbladder surgically absent. No biliary dilatation. Pancreas: Few small subcentimeter calcific densities noted at the pancreatic head, which could reflect sequelae of chronic pancreatitis. Pancreas otherwise unremarkable. Spleen: Heterogeneous enhancement of the spleen like related to timing of the contrast bolus. Spleen otherwise unremarkable. Adrenals/Urinary Tract: Adrenal glands are normal. Kidneys equal in size with symmetric enhancement. Scattered nonobstructive calculi within the kidneys bilaterally, largest of which measures 6 mm at the lower pole of the right kidney, stable from previous. No ureterolithiasis or evidence for obstructive uropathy. No focal enhancing renal mass. Partially distended bladder within normal limits. Stomach/Bowel: Stomach within normal limits. No evidence for bowel obstruction. Normal appendix. No acute inflammatory changes seen about the bowels. Vascular/Lymphatic: Normal  intravascular enhancement seen throughout the intra-abdominal aorta. Mesenteric vessels patent proximally. No adenopathy. Reproductive: Prostate normal. Other: No free air or fluid. Musculoskeletal: No acute osseous finding. No discrete lytic or blastic osseous lesions. IMPRESSION: 1. No CT evidence for acute intra-abdominal or pelvic process. 2. Normal appendix. 3. Bilateral nonobstructive nephrolithiasis as above. 4. Hepatic steatosis. Electronically Signed   By: Rise Mu M.D.   On: 03/11/2019 16:55      Samuel Jester, DO 03/11/19 1719

## 2019-04-05 ENCOUNTER — Emergency Department (HOSPITAL_COMMUNITY): Payer: PRIVATE HEALTH INSURANCE

## 2019-04-05 ENCOUNTER — Emergency Department (HOSPITAL_COMMUNITY)
Admission: EM | Admit: 2019-04-05 | Discharge: 2019-04-05 | Disposition: A | Discharge status: Home / Self Care | Payer: PRIVATE HEALTH INSURANCE | Attending: Emergency Medicine | Admitting: Emergency Medicine

## 2019-04-05 ENCOUNTER — Other Ambulatory Visit: Payer: Self-pay

## 2019-04-05 ENCOUNTER — Encounter (HOSPITAL_COMMUNITY): Payer: Self-pay

## 2019-04-05 DIAGNOSIS — R109 Unspecified abdominal pain: Secondary | ICD-10-CM | POA: Diagnosis present

## 2019-04-05 DIAGNOSIS — K5904 Chronic idiopathic constipation: Secondary | ICD-10-CM | POA: Insufficient documentation

## 2019-04-05 DIAGNOSIS — Z794 Long term (current) use of insulin: Secondary | ICD-10-CM | POA: Diagnosis not present

## 2019-04-05 DIAGNOSIS — R1010 Upper abdominal pain, unspecified: Secondary | ICD-10-CM | POA: Insufficient documentation

## 2019-04-05 DIAGNOSIS — E119 Type 2 diabetes mellitus without complications: Secondary | ICD-10-CM | POA: Diagnosis not present

## 2019-04-05 DIAGNOSIS — Z79899 Other long term (current) drug therapy: Secondary | ICD-10-CM | POA: Insufficient documentation

## 2019-04-05 DIAGNOSIS — Z87891 Personal history of nicotine dependence: Secondary | ICD-10-CM | POA: Diagnosis not present

## 2019-04-05 HISTORY — DX: Full incontinence of feces: R15.9

## 2019-04-05 HISTORY — DX: Unspecified urinary incontinence: R32

## 2019-04-05 LAB — URINALYSIS, ROUTINE W REFLEX MICROSCOPIC
Bacteria, UA: NONE SEEN
Bilirubin Urine: NEGATIVE
Glucose, UA: >=500 mg/dL — AB
Hgb urine dipstick: NEGATIVE
Ketones, ur: 5 mg/dL — AB
Leukocytes,Ua: NEGATIVE
Nitrite: NEGATIVE
Protein, ur: NEGATIVE mg/dL
Specific Gravity, Urine: 1.031 — ABNORMAL HIGH (ref 1.005–1.030)
pH: 7 (ref 5.0–8.0)

## 2019-04-05 LAB — CBC WITH DIFFERENTIAL/PLATELET
Abs Immature Granulocytes: 0.07 10*3/uL (ref 0.00–0.07)
Basophils Absolute: 0.1 10*3/uL (ref 0.0–0.1)
Basophils Relative: 1 %
Eosinophils Absolute: 0.1 10*3/uL (ref 0.0–0.5)
Eosinophils Relative: 2 %
HCT: 38.7 % — ABNORMAL LOW (ref 39.0–52.0)
Hemoglobin: 12.6 g/dL — ABNORMAL LOW (ref 13.0–17.0)
Immature Granulocytes: 1 %
Lymphocytes Relative: 35 %
Lymphs Abs: 2.1 10*3/uL (ref 0.7–4.0)
MCH: 25.9 pg — ABNORMAL LOW (ref 26.0–34.0)
MCHC: 32.6 g/dL (ref 30.0–36.0)
MCV: 79.6 fL — ABNORMAL LOW (ref 80.0–100.0)
Monocytes Absolute: 0.8 10*3/uL (ref 0.1–1.0)
Monocytes Relative: 13 %
Neutro Abs: 2.9 10*3/uL (ref 1.7–7.7)
Neutrophils Relative %: 48 %
Platelets: 241 10*3/uL (ref 150–400)
RBC: 4.86 MIL/uL (ref 4.22–5.81)
RDW: 13.8 % (ref 11.5–15.5)
WBC: 5.9 10*3/uL (ref 4.0–10.5)
nRBC: 0 % (ref 0.0–0.2)

## 2019-04-05 LAB — COMPREHENSIVE METABOLIC PANEL
ALT: 22 U/L (ref 0–44)
AST: 18 U/L (ref 15–41)
Albumin: 4.2 g/dL (ref 3.5–5.0)
Alkaline Phosphatase: 108 U/L (ref 38–126)
Anion gap: 12 (ref 5–15)
BUN: 24 mg/dL — ABNORMAL HIGH (ref 6–20)
CO2: 25 mmol/L (ref 22–32)
Calcium: 9.4 mg/dL (ref 8.9–10.3)
Chloride: 101 mmol/L (ref 98–111)
Creatinine, Ser: 0.99 mg/dL (ref 0.61–1.24)
GFR calc Af Amer: >60 mL/min (ref >60)
GFR calc non Af Amer: >60 mL/min (ref >60)
Glucose, Bld: 220 mg/dL — ABNORMAL HIGH (ref 70–99)
Potassium: 3.9 mmol/L (ref 3.5–5.1)
Sodium: 138 mmol/L (ref 135–145)
Total Bilirubin: 0.2 mg/dL — ABNORMAL LOW (ref 0.3–1.2)
Total Protein: 7.1 g/dL (ref 6.5–8.1)

## 2019-04-05 LAB — LIPASE, BLOOD: Lipase: 36 U/L (ref 11–51)

## 2019-04-05 MED ORDER — MAGNESIUM CITRATE PO SOLN: 1.0000 | Freq: Once | ORAL | 0 refills | Status: AC

## 2019-04-05 MED ORDER — PROMETHAZINE HCL 25 MG/ML IJ SOLN
INTRAMUSCULAR | Status: AC
  Administered 2019-04-05: 12.5 mg via INTRAVENOUS
  Filled 2019-04-05: qty 1

## 2019-04-05 MED ORDER — HYDROMORPHONE HCL 1 MG/ML IJ SOLN
1.0000 mg | Freq: Once | INTRAMUSCULAR | Status: AC
  Administered 2019-04-05: 1 mg via INTRAVENOUS
  Filled 2019-04-05: qty 1

## 2019-04-05 MED ORDER — BISACODYL 5 MG PO TBEC
5.0000 mg | DELAYED_RELEASE_TABLET | Freq: Once | ORAL | Status: AC
  Administered 2019-04-05: 5 mg via ORAL
  Filled 2019-04-05: qty 1

## 2019-04-05 MED ORDER — PROMETHAZINE HCL 25 MG/ML IJ SOLN
12.5000 mg | Freq: Once | INTRAMUSCULAR | Status: AC
  Administered 2019-04-05: 12.5 mg via INTRAVENOUS

## 2019-04-05 MED ORDER — ONDANSETRON HCL 4 MG/2ML IJ SOLN
4.0000 mg | Freq: Once | INTRAMUSCULAR | Status: AC
  Administered 2019-04-05: 4 mg via INTRAVENOUS
  Filled 2019-04-05: qty 2

## 2019-04-05 MED ORDER — SODIUM CHLORIDE 0.9 % IV BOLUS
1000.0000 mL | Freq: Once | INTRAVENOUS | Status: AC
  Administered 2019-04-05: 1000 mL via INTRAVENOUS

## 2019-04-05 MED ORDER — SODIUM CHLORIDE 0.9 % IV SOLN
INTRAVENOUS | Status: DC
  Administered 2019-04-05: 11:00:00 via INTRAVENOUS

## 2019-04-05 MED ORDER — HYDROMORPHONE HCL 1 MG/ML IJ SOLN
0.5000 mg | Freq: Once | INTRAMUSCULAR | Status: AC
  Administered 2019-04-05: 0.5 mg via INTRAVENOUS
  Filled 2019-04-05: qty 1

## 2019-04-05 NOTE — ED Triage Notes (Signed)
Pt c/o abd pain, n/v/d since last night.  Reports cbg was 477 this morning.

## 2019-04-05 NOTE — ED Provider Notes (Addendum)
[facility]ERGENCY DEPARTMENT Provider Note   CSN: [id number] Arrival date & time: 04/05/19  [id number]    History   Chief Complaint Chief Complaint  Patient presents with  . Abdominal Pain    HPI Dennis Yang is a 33 y.o. male with a history of diabetes, kidney stones, osteoarthritis, anxiety, prior history of pancreatitis and surgical history significant for cholecystectomy and hernia repair presenting with abdominal pain which started yesterday evening in his right upper quadrant but now also encompasses his epigastric and left upper quadrant with radiation into his back.  He endorses nausea with dry heaving "all night" and 2 episodes of nonbloody diarrhea along with hot sweats but no documented fevers. He states prior to the diarrhea he chronically has hard stools and difficulty with constipation.  He denies rectal pain. He did use a prescribed suppository last night without having a significant bm (does not recall the name of this med).   His symptoms remind him of prior episodes of pancreatitis.  His cbg yesterday am was in the low 200 range, this am was 477.  He has had no PO intake today including his home medications.  He is on day 3 of clindamycin for treatment of an infected bone spur surgical site of his left foot in anticipation of surgery in 3 days.     The history is provided by the patient.    Past Medical History:  Diagnosis Date  . Anxiety   . Back pain   . Bipolar 1 disorder (HCC)   . Depression   . Diabetes mellitus without complication (HCC)   . Incontinence of bowel   . Incontinence of urine   . Neck pain   . Osteoarthritis   . Schizoaffective disorder [facility](HCC)     Patient Active Problem List   Diagnosis Date Noted  . Acute pancreatitis 01/27/2019  . Bipolar 1 disorder (HCC) 01/27/2019  . Depression with anxiety 01/27/2019  . Type 2 diabetes mellitus without complication (HCC) 01/27/2019    Past Surgical History:  Procedure Laterality Date  . botox  injections in bladder    . CHOLECYSTECTOMY    . ELBOW FRACTURE SURGERY    . HERNIA REPAIR    . LITHOTRIPSY    . NASAL SINUS SURGERY          Home Medications    Prior to Admission medications   Medication Sig Start Date End Date Taking? Authorizing Provider  ARIPiprazole (ABILIFY) 10 MG tablet Take 10 mg by mouth daily.  01/22/19  Yes [provider]  ATROVENT HFA 17 MCG/ACT inhaler Inhale 1 puff into the lungs every 4 (four) hours as needed for wheezing.  01/16/19  Yes [provider]  CARAFATE 1 g tablet Take 1 g by mouth 2 (two) times daily.  12/20/18  Yes [provider]  celecoxib (CELEBREX) 100 MG capsule Take 100 mg by mouth 2 (two) times daily.  01/12/19  Yes [provider]  Cholecalciferol (VITAMIN D3) 1.25 MG (50000 UT) CAPS Take 1 capsule by mouth 3 (three) times a week. 3 times a week 12/24/18  Yes [provider]  clindamycin (CLEOCIN) 300 MG capsule Take 1 capsule by mouth 3 (three) times daily. 04/01/19  Yes [provider]  diclofenac sodium (VOLTAREN) 1 % GEL Apply 2 g topically 3 (three) times daily as needed. 03/31/19  Yes [provider]  divalproex (DEPAKOTE) 250 MG DR tablet Take 250 mg by mouth 2 (two) times daily.  01/22/19  Yes [provider]  docusate sodium (COLACE) 100 MG capsule Take 100 mg by mouth daily.  01/01/19  Yes [provider]  EMGALITY 120 MG/ML SOAJ Inject 1 Dose into the muscle every 30 (thirty) days.  11/24/18  Yes [provider]  escitalopram (LEXAPRO) 20 MG tablet Take 20 mg by mouth daily.  01/22/19  Yes [provider]  FARXIGA 10 MG TABS tablet Take 10 mg by mouth daily.  01/07/19  Yes [provider]  fluticasone (FLONASE) 50 MCG/ACT nasal spray Place 1 spray into both nostrils daily.  01/20/19  Yes [provider]  gabapentin (NEURONTIN) 800 MG tablet Take 800 mg by mouth 3 (three) times daily. 01/22/19  Yes [provider]   glipiZIDE (GLUCOTROL) 10 MG tablet Take 10 mg by mouth 2 (two) times daily. 01/19/19  Yes [provider]  HYDROcodone-acetaminophen (NORCO/VICODIN) 5-325 MG tablet Take 1 tablet by mouth every 6 (six) hours as needed. 03/11/19  Yes Bethann Berkshire, MD  hydrOXYzine (ATARAX/VISTARIL) 50 MG tablet Take 50 mg by mouth 4 (four) times daily. 01/22/19  Yes [provider]  Insulin Detemir (LEVEMIR FLEXTOUCH) 100 UNIT/ML Pen Inject 15 Units into the skin daily. 03/31/19  Yes [provider]  levocetirizine (XYZAL) 5 MG tablet Take 5 mg by mouth every evening.  01/15/19  Yes [provider]  metaxalone (SKELAXIN) 800 MG tablet Take 800 mg by mouth 2 (two) times daily.  01/12/19  Yes [provider]  montelukast (SINGULAIR) 10 MG tablet Take 10 mg by mouth daily.  01/12/19  Yes [provider]  omeprazole (PRILOSEC) 40 MG capsule Take 1 capsule (40 mg total) by mouth daily. 01/28/19  Yes Emokpae, Courage, MD  ondansetron (ZOFRAN ODT) 4 MG disintegrating tablet  ODT q4 hours prn nausea/vomit Patient taking differently: Take 4 mg by mouth every 4 (four) hours as needed.  ODT q4 hours prn nausea/vomit 03/11/19  Yes Bethann Berkshire, MD  oxybutynin (DITROPAN) 5 MG tablet Take 1 mg by mouth 3 (three) times daily. 03/31/19  Yes [provider]  phentermine 37.5 MG capsule Take 37.5 mg by mouth every morning.   Yes [provider]  prazosin (MINIPRESS) 5 MG capsule Take 5 mg by mouth at bedtime.  01/22/19  Yes [provider]  propranolol (INDERAL) 40 MG tablet Take 40 mg by mouth 2 (two) times daily.  01/12/19  Yes [provider]  simvastatin (ZOCOR) 20 MG tablet Take 20 mg by mouth daily at 6 PM.  12/25/18  Yes [provider]  XIIDRA 5 % SOLN Place 2 drops into both eyes daily.  01/21/19  Yes [provider]  magnesium citrate SOLN Take 296 mLs (1 Bottle total) by mouth once for 1 dose. 04/05/19 04/05/19  Burgess Amor, PA-C     Family History No family history on file.  Social History Social History   Tobacco Use  . Smoking status: Former Games developer  . Smokeless tobacco: Never Used  Substance Use Topics  . Alcohol use: Not Currently  . Drug use: Not Currently     Allergies   Penicillins and Ciprofloxacin   Review of Systems Review of Systems  Constitutional: Positive for diaphoresis. Negative for fever.  HENT: Negative for congestion and sore throat.   Eyes: Negative.   Respiratory: Negative for chest tightness and shortness of breath.   Cardiovascular: Negative for chest pain.  Gastrointestinal: Positive for abdominal pain, diarrhea, nausea and vomiting.  Genitourinary: Negative.  Negative for dysuria.  Musculoskeletal:  Negative for arthralgias, joint swelling and neck pain.  Skin: Negative.  Negative for rash and wound.  Neurological: Negative for dizziness, weakness, light-headedness, numbness and headaches.  Psychiatric/Behavioral: Negative.      Physical Exam Updated Vital Signs BP 128/83 (BP Location: Right Arm)   Pulse 97   Temp 97.8 F (36.6 C) (Oral)   Resp 17   Ht 6' (1.829 m)   Wt 102.1 kg   SpO2 97%   BMI 30.52 kg/m   Physical Exam Vitals signs and nursing note reviewed. Exam conducted with a chaperone present.  Constitutional:      Appearance: He is well-developed.  HENT:     Head: Normocephalic and atraumatic.     Mouth/Throat:     Mouth: Mucous membranes are dry.  Eyes:     Conjunctiva/sclera: Conjunctivae normal.  Neck:     Musculoskeletal: Normal range of motion.  Cardiovascular:     Rate and Rhythm: Normal rate and regular rhythm.     Heart sounds: Normal heart sounds.  Pulmonary:     Effort: Pulmonary effort is normal. No respiratory distress.     Breath sounds: Normal breath sounds. No wheezing.  Abdominal:     General: Abdomen is protuberant. Bowel sounds are normal.     Palpations: Abdomen is soft. There is no fluid wave or pulsatile mass.      Tenderness: There is generalized abdominal tenderness.     Comments: ttp entire abd but localizing to upper quadrants.  No guarding.    Genitourinary:    Rectum: Normal. No mass.     Comments: No fecal impaction Musculoskeletal: Normal range of motion.  Skin:    General: Skin is warm and dry.  Neurological:     Mental Status: He is alert.      ED Treatments / Results  Labs (all labs ordered are listed, but only abnormal results are displayed) Labs Reviewed  COMPREHENSIVE METABOLIC PANEL - Abnormal; Notable for the following components:      Result Value   Glucose, Bld 220 (*)    BUN 24 (*)    Total Bilirubin 0.2 (*)    All other components within normal limits  CBC WITH DIFFERENTIAL/PLATELET - Abnormal; Notable for the following components:   Hemoglobin 12.6 (*)    HCT 38.7 (*)    MCV 79.6 (*)    MCH 25.9 (*)    All other components within normal limits  URINALYSIS, ROUTINE W REFLEX MICROSCOPIC - Abnormal; Notable for the following components:   Color, Urine STRAW (*)    Specific Gravity, Urine 1.031 (*)    Glucose, UA >=500 (*)    Ketones, ur 5 (*)    All other components within normal limits  LIPASE, BLOOD    EKG None  Radiology Dg Abd Acute 2+v W 1v Chest  Result Date: 04/05/2019 CLINICAL DATA:  Abdominal pain. EXAM: DG ABDOMEN ACUTE W/ 1V CHEST COMPARISON:  CT abdomen and pelvis 03/11/2019. FINDINGS: There is no evidence of dilated bowel loops or free intraperitoneal air. No radiopaque calculi or other significant radiographic abnormality is seen. Heart size and mediastinal contours are within normal limits. Both lungs are clear. Moderate stool burden. IMPRESSION: Negative abdominal radiographs.  No acute cardiopulmonary disease. Electronically Signed   By: Elsie Stain M.D.   On: 04/05/2019 13:11    Procedures Procedures (including critical care time)  Medications Ordered in ED Medications  0.9 %  sodium chloride infusion ( Intravenous Stopped 04/05/19 1310)   HYDROmorphone (DILAUDID)  injection 1 mg (1 mg Intravenous Given 04/05/19 1000)  ondansetron (ZOFRAN) injection 4 mg (4 mg Intravenous Given 04/05/19 1000)  sodium chloride 0.9 % bolus 1,000 mL (0 mLs Intravenous Stopped 04/05/19 1054)  promethazine (PHENERGAN) injection 12.5 mg (12.5 mg Intravenous Given 04/05/19 1055)  HYDROmorphone (DILAUDID) injection 0.5 mg (0.5 mg Intravenous Given 04/05/19 1227)  sodium chloride 0.9 % bolus 1,000 mL (0 mLs Intravenous Stopped 04/05/19 1400)  bisacodyl (DULCOLAX) EC tablet 5 mg (5 mg Oral Given 04/05/19 1421)     Initial Impression / Assessment and Plan / ED Course  I have reviewed the triage vital signs and the nursing notes.  Pertinent labs & imaging results that were available during my care of the patient were reviewed by me and considered in my medical decision making (see chart for details).        Pt with hyperglyecmia without ketosis, mild dehydration with acute abd series suggesting constipation.  Suspect diarrheal episode occurred secondary to the suppository use, no xray findings to suggest obstipation/ impaction.  Labs normal including liver enzymes and lipase.  Although he has h/o pancreatitis and normal lipase can be present in acute pancreatitis, all other labs are reassuring and he has no acute abd findings on exam. Rectal exam negative for impaction.  He was encouraged to decrease narcotic use (tramadol), increase fluid intake. Dulcolax given here along with a prescription for mag citrate if dulcolax not effective.  He was encouraged to f/u with pcp prn if sx persist or worsen.  (also has GI specialist in [place] but unable to see due to the Covid restrictions).    The patient appears reasonably screened and/or stabilized for discharge and I doubt any other medical condition or other [facility] requiring further screening, evaluation, or treatment in the ED at this time prior to discharge.   Final Clinical Impressions(s) / ED Diagnoses   Final  diagnoses:  Pain of upper abdomen  Chronic idiopathic constipation    ED Discharge Orders         Ordered    magnesium citrate SOLN   Once     04/05/19 1417           Victoriano Lain 04/05/19 1425    Burgess Amor, PA-C 04/05/19 1431    Eber Hong, MD 04/06/19 1124

## 2019-04-05 NOTE — ED Notes (Signed)
Pt received of his 1L bolus ordered. No more given per Burgess Amor, PA.

## 2019-05-15 ENCOUNTER — Other Ambulatory Visit: Payer: Self-pay

## 2019-05-15 ENCOUNTER — Emergency Department (HOSPITAL_COMMUNITY): Payer: Medicaid - Out of State

## 2019-05-15 ENCOUNTER — Encounter (HOSPITAL_COMMUNITY): Payer: Self-pay | Admitting: Emergency Medicine

## 2019-05-15 ENCOUNTER — Emergency Department (HOSPITAL_COMMUNITY)
Admission: EM | Admit: 2019-05-15 | Discharge: 2019-05-16 | Disposition: A | Discharge status: Home / Self Care | Payer: Medicaid - Out of State | Attending: Emergency Medicine | Admitting: Emergency Medicine

## 2019-05-15 DIAGNOSIS — Z87891 Personal history of nicotine dependence: Secondary | ICD-10-CM | POA: Diagnosis not present

## 2019-05-15 DIAGNOSIS — K439 Ventral hernia without obstruction or gangrene: Secondary | ICD-10-CM | POA: Diagnosis not present

## 2019-05-15 DIAGNOSIS — Z7984 Long term (current) use of oral hypoglycemic drugs: Secondary | ICD-10-CM | POA: Insufficient documentation

## 2019-05-15 DIAGNOSIS — R102 Pelvic and perineal pain: Secondary | ICD-10-CM | POA: Diagnosis present

## 2019-05-15 DIAGNOSIS — E119 Type 2 diabetes mellitus without complications: Secondary | ICD-10-CM | POA: Insufficient documentation

## 2019-05-15 DIAGNOSIS — Z79899 Other long term (current) drug therapy: Secondary | ICD-10-CM | POA: Insufficient documentation

## 2019-05-15 HISTORY — DX: Osteonecrosis, unspecified: M87.9

## 2019-05-15 LAB — COMPREHENSIVE METABOLIC PANEL
ALT: 29 U/L (ref 0–44)
AST: 21 U/L (ref 15–41)
Albumin: 3.9 g/dL (ref 3.5–5.0)
Alkaline Phosphatase: 121 U/L (ref 38–126)
Anion gap: 11 (ref 5–15)
BUN: 18 mg/dL (ref 6–20)
CO2: 23 mmol/L (ref 22–32)
Calcium: 8.9 mg/dL (ref 8.9–10.3)
Chloride: 105 mmol/L (ref 98–111)
Creatinine, Ser: 1.12 mg/dL (ref 0.61–1.24)
GFR calc Af Amer: >60 mL/min (ref >60)
GFR calc non Af Amer: >60 mL/min (ref >60)
Glucose, Bld: 220 mg/dL — ABNORMAL HIGH (ref 70–99)
Potassium: 4.4 mmol/L (ref 3.5–5.1)
Sodium: 139 mmol/L (ref 135–145)
Total Bilirubin: 0.2 mg/dL — ABNORMAL LOW (ref 0.3–1.2)
Total Protein: 7.1 g/dL (ref 6.5–8.1)

## 2019-05-15 LAB — CBC WITH DIFFERENTIAL/PLATELET
Abs Immature Granulocytes: 0.12 10*3/uL — ABNORMAL HIGH (ref 0.00–0.07)
Basophils Absolute: 0.1 10*3/uL (ref 0.0–0.1)
Basophils Relative: 1 %
Eosinophils Absolute: 0.3 10*3/uL (ref 0.0–0.5)
Eosinophils Relative: 4 %
HCT: 35 % — ABNORMAL LOW (ref 39.0–52.0)
Hemoglobin: 11 g/dL — ABNORMAL LOW (ref 13.0–17.0)
Immature Granulocytes: 2 %
Lymphocytes Relative: 28 %
Lymphs Abs: 2.2 10*3/uL (ref 0.7–4.0)
MCH: 24.8 pg — ABNORMAL LOW (ref 26.0–34.0)
MCHC: 31.4 g/dL (ref 30.0–36.0)
MCV: 78.8 fL — ABNORMAL LOW (ref 80.0–100.0)
Monocytes Absolute: 0.8 10*3/uL (ref 0.1–1.0)
Monocytes Relative: 11 %
Neutro Abs: 4.4 10*3/uL (ref 1.7–7.7)
Neutrophils Relative %: 54 %
Platelets: 275 10*3/uL (ref 150–400)
RBC: 4.44 MIL/uL (ref 4.22–5.81)
RDW: 14.3 % (ref 11.5–15.5)
WBC: 7.9 10*3/uL (ref 4.0–10.5)
nRBC: 0 % (ref 0.0–0.2)

## 2019-05-15 LAB — URINALYSIS, ROUTINE W REFLEX MICROSCOPIC
Bacteria, UA: NONE SEEN
Bilirubin Urine: NEGATIVE
Glucose, UA: >=500 mg/dL — AB
Hgb urine dipstick: NEGATIVE
Ketones, ur: NEGATIVE mg/dL
Leukocytes,Ua: NEGATIVE
Nitrite: NEGATIVE
Protein, ur: NEGATIVE mg/dL
Specific Gravity, Urine: 1.022 (ref 1.005–1.030)
pH: 7 (ref 5.0–8.0)

## 2019-05-15 MED ORDER — MORPHINE SULFATE (PF) 4 MG/ML IV SOLN
4.0000 mg | Freq: Once | INTRAVENOUS | Status: AC
  Administered 2019-05-15: 4 mg via INTRAVENOUS
  Filled 2019-05-15: qty 1

## 2019-05-15 MED ORDER — ONDANSETRON HCL 4 MG/2ML IJ SOLN
4.0000 mg | Freq: Once | INTRAMUSCULAR | Status: AC
  Administered 2019-05-15: 4 mg via INTRAVENOUS
  Filled 2019-05-15: qty 2

## 2019-05-15 MED ORDER — IOHEXOL 300 MG/ML  SOLN
100.0000 mL | Freq: Once | INTRAMUSCULAR | Status: AC | PRN
  Administered 2019-05-15: 22:00:00 100 mL via INTRAVENOUS

## 2019-05-15 NOTE — ED Notes (Signed)
From CT 

## 2019-05-15 NOTE — ED Notes (Signed)
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

## 2019-05-15 NOTE — ED Triage Notes (Signed)
Pt c/o LLQ pain that started about 3 weeks ago. Pt recently had surgery on his left foot and left hip 2 weeks ago. Pt states the pain has progressively gotten worse. PCP told pt to be seen in ED

## 2019-05-15 NOTE — ED Notes (Signed)
Pt in CT.

## 2019-05-16 MED ORDER — OXYCODONE-ACETAMINOPHEN 5-325 MG PO TABS: 1.0000 | ORAL_TABLET | ORAL | 0 refills | Status: DC | PRN

## 2019-05-16 NOTE — Discharge Instructions (Addendum)
You do have a small hernia with a small portion of fat stuck within the hernia.  This is painful but not a surgical emergency. As discussed you may try ice packs followed by gentle pressure which can help relieve this discomfort.  You will need to see a general surgeon to discuss whether surgery will be helpful (it may be if you continue to have persistent problems with this (these symptoms can also come and go).  Get rechecked urgently for any fevers, vomiting or worse pain as discussed. Do not drive within 4 hours of taking oxycodone as this will make you drowsy.

## 2019-05-16 NOTE — ED Provider Notes (Signed)
[facility] EMERGENCY DEPARTMENT Provider Note   CSN: [id number] Arrival date & time: 05/15/19  1959    History   Chief Complaint Chief Complaint  Patient presents with   Groin Pain    HPI Dennis Yang is a 33 y.o. 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  He describes a very localized sharp stabbing pain in his left pelvis region which started out mild and has become severe over the past 2 days.  His pain is worsened with movement palpation and certain positional changes.  He denies fevers or chills has had no nausea or vomiting and has maintained a good appetite.  Denies any swelling at the site.  Has had no radiation of pain including into his scrotum.  He denies dysuria, hematuria and penile discharge.  Also denies flank pain, although has soreness at his left iliac crest region which has been present since his surgery.  Discussed symptoms with his PCP who is concerned about possible diverticulitis and was advised to present here.  He is currently taking hydrocodone due to his recent foot surgery which is not relieving his pelvic pain.  He has found no other alleviators.     The history is provided by the patient.    Past Medical History:  Diagnosis Date   Anxiety    Back pain    Bipolar 1 disorder (HCC)    Depression    Diabetes mellitus without complication (HCC)    Incontinence of bowel    Incontinence of urine    Neck pain    Osteoarthritis    Osteonecrosis (HCC)    left foot   Schizoaffective disorder [facility])     Patient Active Problem List   Diagnosis Date Noted   Acute pancreatitis 01/27/2019   Bipolar 1 disorder (HCC) 01/27/2019    Depression with anxiety 01/27/2019   Type 2 diabetes mellitus without complication (HCC) 01/27/2019    Past Surgical History:  Procedure Laterality Date   botox injections in bladder     CHOLECYSTECTOMY     ELBOW FRACTURE SURGERY     FOOT SURGERY     HERNIA REPAIR     LITHOTRIPSY     NASAL SINUS SURGERY          Home Medications    Prior to Admission medications   Medication Sig Start Date End Date Taking? Authorizing Provider  ARIPiprazole (ABILIFY) 10 MG tablet Take 10 mg by mouth daily.  01/22/19   [provider]  ATROVENT HFA 17 MCG/ACT inhaler Inhale 1 puff into the lungs every 4 (four) hours as needed for wheezing.  01/16/19   [provider]  CARAFATE 1 g tablet Take 1 g by mouth 2 (two) times daily.  12/20/18   [provider]  celecoxib (CELEBREX) 100 MG capsule Take 100 mg by mouth 2 (two) times daily.  01/12/19   [provider]  Cholecalciferol (VITAMIN D3) 1.25 MG (50000 UT) CAPS Take 1 capsule by mouth 3 (three) times a week. 3 times a week 12/24/18   [provider]  clindamycin (CLEOCIN) 300 MG capsule Take 1 capsule by mouth 3 (three) times daily. 04/01/19   [provider]  diclofenac sodium (VOLTAREN) 1 % GEL Apply  2 g topically 3 (three) times daily as needed. 03/31/19   [provider]  divalproex (DEPAKOTE) 250 MG DR tablet Take 250 mg by mouth 2 (two) times daily.  01/22/19   [provider]  docusate sodium (COLACE) 100 MG capsule Take 100 mg by mouth daily.  01/01/19   [provider]  EMGALITY 120 MG/ML SOAJ Inject 1 Dose into the muscle every 30 (thirty) days.  11/24/18   [provider]  escitalopram (LEXAPRO) 20 MG tablet Take 20 mg by mouth daily.  01/22/19   [provider]  FARXIGA 10 MG TABS tablet Take 10 mg by mouth daily.  01/07/19   [provider]  fluticasone (FLONASE) 50 MCG/ACT nasal spray Place 1 spray into both nostrils daily.  01/20/19    [provider]  gabapentin (NEURONTIN) 800 MG tablet Take 800 mg by mouth 3 (three) times daily. 01/22/19   [provider]  glipiZIDE (GLUCOTROL) 10 MG tablet Take 10 mg by mouth 2 (two) times daily. 01/19/19   [provider]  HYDROcodone-acetaminophen (NORCO/VICODIN) 5-325 MG tablet Take 1 tablet by mouth every 6 (six) hours as needed. 03/11/19   Milton Ferguson, MD  hydrOXYzine (ATARAX/VISTARIL) 50 MG tablet Take 50 mg by mouth 4 (four) times daily. 01/22/19   [provider]  Insulin Detemir (LEVEMIR FLEXTOUCH) 100 UNIT/ML Pen Inject 15 Units into the skin daily. 03/31/19   [provider]  levocetirizine (XYZAL) 5 MG tablet Take 5 mg by mouth every evening.  01/15/19   [provider]  metaxalone (SKELAXIN) 800 MG tablet Take 800 mg by mouth 2 (two) times daily.  01/12/19   [provider]  montelukast (SINGULAIR) 10 MG tablet Take 10 mg by mouth daily.  01/12/19   [provider]  omeprazole (PRILOSEC) 40 MG capsule Take 1 capsule (40 mg total) by mouth daily. 01/28/19   Roxan Hockey, MD  ondansetron (ZOFRAN ODT) 4 MG disintegrating tablet 4mg  ODT q4 hours prn nausea/vomit Patient taking differently: Take 4 mg by mouth every 4 (four) hours as needed. 4mg  ODT q4 hours prn nausea/vomit 03/11/19   Milton Ferguson, MD  oxybutynin (DITROPAN) 5 MG tablet Take 1 mg by mouth 3 (three) times daily. 03/31/19   [provider]  oxyCODONE-acetaminophen (PERCOCET/ROXICET) 5-325 MG tablet Take 1 tablet by mouth every 4 (four) hours as needed. 05/16/19   Evalee Jefferson, PA-C  phentermine 37.5 MG capsule Take 37.5 mg by mouth every morning.    [provider]  prazosin (MINIPRESS) 5 MG capsule Take 5 mg by mouth at bedtime.  01/22/19   [provider]  propranolol (INDERAL) 40 MG tablet Take 40 mg by mouth 2 (two) times daily.  01/12/19   [provider]  simvastatin (ZOCOR) 20 MG tablet Take 20 mg by mouth daily at 6 PM.   12/25/18   [provider]  XIIDRA 5 % SOLN Place 2 drops into both eyes daily.  01/21/19   [provider]    Family History No family history on file.  Social History Social History   Tobacco Use   Smoking status: Former Smoker   Smokeless tobacco: Never Used  Substance Use Topics   Alcohol use: Not Currently   Drug use: Not Currently     Allergies   Penicillins and Ciprofloxacin   Review of Systems Review of Systems  Constitutional: Negative for chills and fever.  HENT: Negative for congestion and sore throat.   Eyes: Negative.  Respiratory: Negative for chest tightness and shortness of breath.   Cardiovascular: Negative for chest pain.  Gastrointestinal: Negative for abdominal pain, nausea and vomiting.  Genitourinary: Negative.   Musculoskeletal: Positive for arthralgias. Negative for joint swelling and neck pain.  Skin: Negative.  Negative for rash and wound.  Neurological: Negative for dizziness, weakness, light-headedness, numbness and headaches.  Psychiatric/Behavioral: Negative.      Physical Exam Updated Vital Signs BP 111/67    Pulse 87    Temp 98.1 F (36.7 C) (Oral)    Resp 18    Ht 6' (1.829 m)    Wt 102.1 kg    SpO2 96%    BMI 30.52 kg/m   Physical Exam Vitals signs and nursing note reviewed.  Constitutional:      Appearance: He is well-developed.  HENT:     Head: Normocephalic and atraumatic.     Mouth/Throat:     Mouth: Mucous membranes are moist.     Pharynx: Oropharynx is clear.  Eyes:     Conjunctiva/sclera: Conjunctivae normal.  Neck:     Musculoskeletal: Normal range of motion.  Cardiovascular:     Rate and Rhythm: Normal rate and regular rhythm.     Heart sounds: Normal heart sounds.  Pulmonary:     Effort: Pulmonary effort is normal.     Breath sounds: Normal breath sounds. No wheezing.  Abdominal:     General: Bowel sounds are normal.     Palpations: Abdomen is soft. There is no mass.     Tenderness:  There is abdominal tenderness. There is no right CVA tenderness, left CVA tenderness, guarding or rebound.     Hernia: No hernia is present.     Comments: Patient has point tenderness at the left lower pelvis just superior to his groin.  There is no edema or mass appreciated.  There is a well-healed surgical incision which is more lateral to the patient's point of tenderness.  No edema or erythema at the site.  Musculoskeletal: Normal range of motion.     Comments: Patient's left foot is in a cam walker.  Skin:    General: Skin is warm and dry.  Neurological:     General: No focal deficit present.     Mental Status: He is alert and oriented to person, place, and time.      ED Treatments / Results  Labs (all labs ordered are listed, but only abnormal results are displayed) Labs Reviewed  CBC WITH DIFFERENTIAL/PLATELET - Abnormal; Notable for the following components:      Result Value   Hemoglobin 11.0 (*)    HCT 35.0 (*)    MCV 78.8 (*)    MCH 24.8 (*)    Abs Immature Granulocytes 0.12 (*)    All other components within normal limits  COMPREHENSIVE METABOLIC PANEL - Abnormal; Notable for the following components:   Glucose, Bld 220 (*)    Total Bilirubin 0.2 (*)    All other components within normal limits  URINALYSIS, ROUTINE W REFLEX MICROSCOPIC - Abnormal; Notable for the following components:   Color, Urine STRAW (*)    Glucose, UA >=500 (*)    All other components within normal limits    EKG None  Radiology Ct Abdomen Pelvis W Contrast  Result Date: 05/15/2019 CLINICAL DATA:  Abdominal pain. Diverticulitis suspected. Left flank and left lower quadrant abdominal pain. EXAM: CT ABDOMEN AND PELVIS WITH CONTRAST TECHNIQUE: Multidetector CT imaging of the abdomen and pelvis was performed using the  standard protocol following bolus administration of intravenous contrast. CONTRAST:  100mL OMNIPAQUE IOHEXOL 300 MG/ML  SOLN COMPARISON:  CT dated 03/11/2019. FINDINGS: Lower chest:  No acute abnormality. Hepatobiliary: There is a patent steatosis. The patient is status post prior cholecystectomy. Pancreas: There is a stable calcification near the pancreatic head. The remaining portions of the pancreas are unremarkable. Spleen: Normal in size without focal abnormality. Adrenals/Urinary Tract: There is a nonobstructing 4-5 mm stone in the lower pole the right kidney. There are punctate nonobstructing stones in the lower pole of the left kidney. There is no hydronephrosis. The bladder is unremarkable. Stomach/Bowel: Stomach is unremarkable. There is a moderate amount of stool throughout the colon. There is no evidence of diverticulitis. No evidence of a small-bowel obstruction the appendix is normal. Vascular/Lymphatic: No significant vascular findings are present. No enlarged abdominal or pelvic lymph nodes. Reproductive: Prostate is unremarkable. Other: There are presumed postsurgical changes of the left iliac bone. This may represent an area of bone graft harvesting. The harvest site is fractured with evidence of adjacent callus formation. There is a 3 x 8.1 cm low-attenuation collection adjacent to the bone graft harvest site. There appears to be a defect within the left transversus abdominus. There is edema within the left iliacus muscle. Musculoskeletal: See above for description of the left iliac bone. IMPRESSION: 1. No CT evidence of diverticulitis. There is a moderate amount of stool throughout the colon. 2. Irregularity of the left iliac bone is likely in part related to the patient's reported surgical intervention at this location. There is a fracture of the adjacent iliac bone with evidence of callus formation. There is a 3 x 8.1 cm low-attenuation fluid collection adjacent to the harvest site which may represent a postoperative seroma, hematoma, versus less likely an abscess. There is a defect in the left transversus abdominus muscle, which may be related to the recent surgery. There  is a small amount of peritoneal fat herniating through the defect. 3. Hepatic steatosis. 4. Bilateral nonobstructing kidney stones are noted. Electronically Signed   By: Katherine Mantlehristopher  Green M.D.   On: 05/15/2019 22:56    Procedures Procedures (including critical care time)  Medications Ordered in ED Medications  morphine 4 MG/ML injection 4 mg (4 mg Intravenous Given 05/15/19 2138)  ondansetron (ZOFRAN) injection 4 mg (4 mg Intravenous Given 05/15/19 2138)  iohexol (OMNIPAQUE) 300 MG/ML solution 100 mL (100 mLs Intravenous Contrast Given 05/15/19 2211)     Initial Impression / Assessment and Plan / ED Course  I have reviewed the triage vital signs and the nursing notes.  Pertinent labs & imaging results that were available during my care of the patient were reviewed by me and considered in my medical decision making (see chart for details).        Labs and imaging reviewed and discussed with patient.  He has a small ventral hernia with fat containing herniation.  Postsurgical changes as expected.  Patient has a normal white blood cell count, no fevers, fever postsurgical findings seroma, doubt infection.  He does have a follow-up with his orthopedic surgeon in 5 days.  He was advised to keep this appointment.  He was given oxycodone to try in place of oxycodone for his hernia pain.  We discussed ice packs and gentle pressure and avoiding activities that worsen abdominal wall pressure.  Referral to general surgery for consideration of elective surgery.  Discussed strict return precautions.  Final Clinical Impressions(s) / ED Diagnoses   Final diagnoses:  Ventral  hernia without obstruction or gangrene    ED Discharge Orders         Ordered    oxyCODONE-acetaminophen (PERCOCET/ROXICET) 5-325 MG tablet  Every 4 hours PRN     05/16/19 0001           Burgess Amordol, Elysse Polidore, PA-C 05/16/19 0037    Geoffery Lyonselo, Douglas, MD 05/19/19 2339

## 2019-06-27 ENCOUNTER — Emergency Department (HOSPITAL_COMMUNITY): Payer: Medicaid - Out of State

## 2019-06-27 ENCOUNTER — Other Ambulatory Visit: Payer: Self-pay

## 2019-06-27 ENCOUNTER — Encounter (HOSPITAL_COMMUNITY): Payer: Self-pay | Admitting: Emergency Medicine

## 2019-06-27 ENCOUNTER — Emergency Department (HOSPITAL_COMMUNITY)
Admission: EM | Admit: 2019-06-27 | Discharge: 2019-06-27 | Disposition: A | Discharge status: Home / Self Care | Payer: Medicaid - Out of State | Attending: Emergency Medicine | Admitting: Emergency Medicine

## 2019-06-27 DIAGNOSIS — Z7984 Long term (current) use of oral hypoglycemic drugs: Secondary | ICD-10-CM | POA: Diagnosis not present

## 2019-06-27 DIAGNOSIS — R05 Cough: Secondary | ICD-10-CM | POA: Insufficient documentation

## 2019-06-27 DIAGNOSIS — R1084 Generalized abdominal pain: Secondary | ICD-10-CM | POA: Diagnosis not present

## 2019-06-27 DIAGNOSIS — Z87891 Personal history of nicotine dependence: Secondary | ICD-10-CM | POA: Insufficient documentation

## 2019-06-27 DIAGNOSIS — Z79899 Other long term (current) drug therapy: Secondary | ICD-10-CM | POA: Diagnosis not present

## 2019-06-27 DIAGNOSIS — Z20828 Contact with and (suspected) exposure to other viral communicable diseases: Secondary | ICD-10-CM | POA: Insufficient documentation

## 2019-06-27 DIAGNOSIS — E119 Type 2 diabetes mellitus without complications: Secondary | ICD-10-CM | POA: Diagnosis not present

## 2019-06-27 DIAGNOSIS — R059 Cough, unspecified: Secondary | ICD-10-CM

## 2019-06-27 DIAGNOSIS — R0789 Other chest pain: Secondary | ICD-10-CM | POA: Diagnosis not present

## 2019-06-27 LAB — CBC WITH DIFFERENTIAL/PLATELET
Abs Immature Granulocytes: 0.04 10*3/uL (ref 0.00–0.07)
Basophils Absolute: 0 10*3/uL (ref 0.0–0.1)
Basophils Relative: 1 %
Eosinophils Absolute: 0.1 10*3/uL (ref 0.0–0.5)
Eosinophils Relative: 2 %
HCT: 35.3 % — ABNORMAL LOW (ref 39.0–52.0)
Hemoglobin: 11 g/dL — ABNORMAL LOW (ref 13.0–17.0)
Immature Granulocytes: 1 %
Lymphocytes Relative: 36 %
Lymphs Abs: 2.2 10*3/uL (ref 0.7–4.0)
MCH: 23.6 pg — ABNORMAL LOW (ref 26.0–34.0)
MCHC: 31.2 g/dL (ref 30.0–36.0)
MCV: 75.6 fL — ABNORMAL LOW (ref 80.0–100.0)
Monocytes Absolute: 0.7 10*3/uL (ref 0.1–1.0)
Monocytes Relative: 11 %
Neutro Abs: 3 10*3/uL (ref 1.7–7.7)
Neutrophils Relative %: 49 %
Platelets: 232 10*3/uL (ref 150–400)
RBC: 4.67 MIL/uL (ref 4.22–5.81)
RDW: 15.4 % (ref 11.5–15.5)
WBC: 6 10*3/uL (ref 4.0–10.5)
nRBC: 0 % (ref 0.0–0.2)

## 2019-06-27 LAB — COMPREHENSIVE METABOLIC PANEL
ALT: 63 U/L — ABNORMAL HIGH (ref 0–44)
AST: 40 U/L (ref 15–41)
Albumin: 4.2 g/dL (ref 3.5–5.0)
Alkaline Phosphatase: 201 U/L — ABNORMAL HIGH (ref 38–126)
Anion gap: 9 (ref 5–15)
BUN: 20 mg/dL (ref 6–20)
CO2: 19 mmol/L — ABNORMAL LOW (ref 22–32)
Calcium: 8.6 mg/dL — ABNORMAL LOW (ref 8.9–10.3)
Chloride: 112 mmol/L — ABNORMAL HIGH (ref 98–111)
Creatinine, Ser: 1.12 mg/dL (ref 0.61–1.24)
GFR calc Af Amer: >60 mL/min (ref >60)
GFR calc non Af Amer: >60 mL/min (ref >60)
Glucose, Bld: 125 mg/dL — ABNORMAL HIGH (ref 70–99)
Potassium: 3.6 mmol/L (ref 3.5–5.1)
Sodium: 140 mmol/L (ref 135–145)
Total Bilirubin: 0.5 mg/dL (ref 0.3–1.2)
Total Protein: 6.8 g/dL (ref 6.5–8.1)

## 2019-06-27 LAB — TROPONIN I (HIGH SENSITIVITY): Troponin I (High Sensitivity): <2 ng/L (ref <18)

## 2019-06-27 LAB — D-DIMER, QUANTITATIVE (NOT AT ARMC): D-Dimer, Quant: 0.37 ug/mL-FEU (ref 0.00–0.50)

## 2019-06-27 LAB — SARS CORONAVIRUS 2 BY RT PCR (HOSPITAL ORDER, PERFORMED IN ~~LOC~~ HOSPITAL LAB): SARS Coronavirus 2: NEGATIVE

## 2019-06-27 MED ORDER — PROMETHAZINE HCL 12.5 MG PO TABS
12.5000 mg | ORAL_TABLET | Freq: Once | ORAL | Status: AC
  Administered 2019-06-27: 12.5 mg via ORAL
  Filled 2019-06-27: qty 1

## 2019-06-27 MED ORDER — SODIUM CHLORIDE 0.9 % IV SOLN
1000.0000 mL | INTRAVENOUS | Status: DC
  Administered 2019-06-27: 17:00:00 1000 mL via INTRAVENOUS

## 2019-06-27 MED ORDER — SODIUM CHLORIDE 0.9 % IV BOLUS (SEPSIS)
1000.0000 mL | Freq: Once | INTRAVENOUS | Status: AC
  Administered 2019-06-27: 1000 mL via INTRAVENOUS

## 2019-06-27 MED ORDER — PROMETHAZINE HCL 12.5 MG PO TABS: 12.5000 mg | ORAL_TABLET | Freq: Once | ORAL | Status: DC

## 2019-06-27 MED ORDER — ONDANSETRON HCL 4 MG/2ML IJ SOLN
4.0000 mg | Freq: Once | INTRAMUSCULAR | Status: AC
  Administered 2019-06-27: 4 mg via INTRAVENOUS
  Filled 2019-06-27: qty 2

## 2019-06-27 MED ORDER — HYDROCODONE-ACETAMINOPHEN 5-325 MG PO TABS
2.0000 | ORAL_TABLET | Freq: Once | ORAL | Status: AC
  Administered 2019-06-27: 2 via ORAL
  Filled 2019-06-27: qty 2

## 2019-06-27 NOTE — ED Provider Notes (Signed)
[facility] EMERGENCY DEPARTMENT Provider Note   CSN: [id number] Arrival date & time: 06/27/19  1547     History   Chief Complaint Chief Complaint  Patient presents with  . Cough    HPI Dennis Yang is a 33 y.o. male.     Patient is a 33 year old male who presents to the emergency department with a complaint of cough and generally not feeling well.  The patient states that he has been feeling sick and weak over the last few days.  On yesterday he began having some pain in his left chest.  He said that it was a sharp type pain.  Today it seems to be worse, and is complicated by his taking a deep breath.  This pain is accompanied by a sore throat, he says he has a problem with his nose, and also diarrhea.  The patient states that approximately 2 days ago he was having some abdominal area pain.  He was seen at the emergency department in [place].  He was told that they thought that he probably had GI ulcers.  Patient was started on probiotics and referred to GI.  The patient states he has questions about their sanitation.  He also was told that 1 of the patients in the waiting room while he was there was COVID positive, and he is concerned that some of his symptoms may be related to the COVID-19 virus.  The patient denies any blood in his diarrhea.  He has had a dry cough and no blood from his coughing.  He says he feels tired and fatigued and weak.  He presents at this time for assistance with these multiple issues.  It is also of note the patient states that he is diabetic.  He says that he had a "dead bone" in his foot and had to have a graft from his hip put in.  He says that he developed an open wound, requiring a wound VAC.  He is waiting for this to improve.  He denies recent fever or chills related to this area.  The history is provided by the patient.    Past Medical History:  Diagnosis Date  . Anxiety   . Back pain   . Bipolar 1 disorder (HCC)   . Depression   .  Diabetes mellitus without complication (HCC)   . Incontinence of bowel   . Incontinence of urine   . Neck pain   . Osteoarthritis   . Osteonecrosis (HCC)    left foot  . Schizoaffective disorder [facility](HCC)     Patient Active Problem List   Diagnosis Date Noted  . Acute pancreatitis 01/27/2019  . Bipolar 1 disorder (HCC) 01/27/2019  . Depression with anxiety 01/27/2019  . Type 2 diabetes mellitus without complication (HCC) 01/27/2019    Past Surgical History:  Procedure Laterality Date  . botox injections in bladder    . CHOLECYSTECTOMY    . ELBOW FRACTURE SURGERY    . FOOT SURGERY    . HERNIA REPAIR    . LITHOTRIPSY    . NASAL SINUS SURGERY          Home Medications    Prior to Admission medications   Medication Sig Start Date End Date Taking? Authorizing Provider  ARIPiprazole (ABILIFY) 10 MG tablet Take 10 mg by mouth daily.  01/22/19   [provider]  ATROVENT HFA 17 MCG/ACT inhaler Inhale 1 puff into the lungs every 4 (four) hours as needed for wheezing.  01/16/19  [provider]  CARAFATE 1 g tablet Take 1 g by mouth 2 (two) times daily.  12/20/18   [provider]  celecoxib (CELEBREX) 100 MG capsule Take 100 mg by mouth 2 (two) times daily.  01/12/19   [provider]  Cholecalciferol (VITAMIN D3) 1.25 MG (50000 UT) CAPS Take 1 capsule by mouth 3 (three) times a week. 3 times a week 12/24/18   [provider]  clindamycin (CLEOCIN) 300 MG capsule Take 1 capsule by mouth 3 (three) times daily. 04/01/19   [provider]  diclofenac sodium (VOLTAREN) 1 % GEL Apply 2 g topically 3 (three) times daily as needed. 03/31/19   [provider]  divalproex (DEPAKOTE) 250 MG DR tablet Take 250 mg by mouth 2 (two) times daily.  01/22/19   [provider]  docusate sodium (COLACE) 100 MG capsule Take 100 mg by mouth daily.  01/01/19   [provider]  EMGALITY 120 MG/ML SOAJ Inject 1 Dose into the muscle every  30 (thirty) days.  11/24/18   [provider]  escitalopram (LEXAPRO) 20 MG tablet Take 20 mg by mouth daily.  01/22/19   [provider]  FARXIGA 10 MG TABS tablet Take 10 mg by mouth daily.  01/07/19   [provider]  fluticasone (FLONASE) 50 MCG/ACT nasal spray Place 1 spray into both nostrils daily.  01/20/19   [provider]  gabapentin (NEURONTIN) 800 MG tablet Take 800 mg by mouth 3 (three) times daily. 01/22/19   [provider]  glipiZIDE (GLUCOTROL) 10 MG tablet Take 10 mg by mouth 2 (two) times daily. 01/19/19   [provider]  HYDROcodone-acetaminophen (NORCO/VICODIN) 5-325 MG tablet Take 1 tablet by mouth every 6 (six) hours as needed. 03/11/19   Bethann BerkshireZammit, Joseph, MD  hydrOXYzine (ATARAX/VISTARIL) 50 MG tablet Take 50 mg by mouth 4 (four) times daily. 01/22/19   [provider]  Insulin Detemir (LEVEMIR FLEXTOUCH) 100 UNIT/ML Pen Inject 15 Units into the skin daily. 03/31/19   [provider]  levocetirizine (XYZAL) 5 MG tablet Take 5 mg by mouth every evening.  01/15/19   [provider]  metaxalone (SKELAXIN) 800 MG tablet Take 800 mg by mouth 2 (two) times daily.  01/12/19   [provider]  montelukast (SINGULAIR) 10 MG tablet Take 10 mg by mouth daily.  01/12/19   [provider]  omeprazole (PRILOSEC) 40 MG capsule Take 1 capsule (40 mg total) by mouth daily. 01/28/19   Shon HaleEmokpae, Courage, MD  ondansetron (ZOFRAN ODT) 4 MG disintegrating tablet 4mg  ODT q4 hours prn nausea/vomit Patient taking differently: Take 4 mg by mouth every 4 (four) hours as needed. 4mg  ODT q4 hours prn nausea/vomit 03/11/19   Bethann BerkshireZammit, Joseph, MD  oxybutynin (DITROPAN) 5 MG tablet Take 1 mg by mouth 3 (three) times daily. 03/31/19   [provider]  oxyCODONE-acetaminophen (PERCOCET/ROXICET) 5-325 MG tablet Take 1 tablet by mouth every 4 (four) hours as needed. 05/16/19   Burgess AmorIdol, Julie, PA-C  phentermine 37.5 MG capsule Take  37.5 mg by mouth every morning.    [provider]  prazosin (MINIPRESS) 5 MG capsule Take 5 mg by mouth at bedtime.  01/22/19   [provider]  propranolol (INDERAL) 40 MG tablet Take 40 mg by mouth 2 (two) times daily.  01/12/19   [provider]  simvastatin (ZOCOR) 20 MG tablet Take 20 mg by mouth daily at 6 PM.  12/25/18   [provider]  XIIDRA 5 % SOLN Place 2 drops into both eyes daily.  01/21/19   [provider]    Family History History reviewed. No pertinent family history.  Social History Social History   Tobacco Use  . Smoking status: Former Research scientist (life sciences)  . Smokeless tobacco: Never Used  Substance Use Topics  . Alcohol use: Not Currently  . Drug use: Not Currently     Allergies   Penicillins and Ciprofloxacin   Review of Systems Review of Systems  Constitutional: Negative for activity change and fever.       All ROS Neg except as noted in HPI  HENT: Positive for congestion and sore throat. Negative for nosebleeds.   Eyes: Negative for photophobia and discharge.  Respiratory: Positive for cough and chest tightness. Negative for shortness of breath and wheezing.   Cardiovascular: Negative for chest pain and palpitations.  Gastrointestinal: Positive for abdominal pain and diarrhea. Negative for blood in stool.  Genitourinary: Negative for dysuria, frequency and hematuria.  Musculoskeletal: Negative for arthralgias, back pain and neck pain.  Skin: Positive for wound.       Foot wound.  Neurological: Negative for dizziness, seizures and speech difficulty.  Psychiatric/Behavioral: Negative for confusion and hallucinations.     Physical Exam Updated Vital Signs BP 109/64 (BP Location: Right Arm)   Pulse 80   Temp 98.1 F (36.7 C) (Oral)   Resp 16   Ht 6' (1.829 m)   Wt 106.6 kg   SpO2 97%   BMI 31.87 kg/m   Physical Exam Vitals signs and nursing note reviewed.  Constitutional:      Appearance: He is well-developed.  He is not toxic-appearing.  HENT:     Head: Normocephalic.     Right Ear: Tympanic membrane and external ear normal.     Left Ear: Tympanic membrane and external ear normal.     Nose: Congestion present.  Eyes:     General: Lids are normal.     Pupils: Pupils are equal, round, and reactive to light.  Neck:     Musculoskeletal: Normal range of motion and neck supple.     Vascular: No carotid bruit.  Cardiovascular:     Rate and Rhythm: Normal rate and regular rhythm.     Pulses: Normal pulses.     Heart sounds: Normal heart sounds.     Comments: Left chest wall pain. Pulmonary:     Effort: No respiratory distress.     Breath sounds: Normal breath sounds.  Abdominal:     General: Bowel sounds are normal.     Palpations: Abdomen is soft.     Tenderness: There is no abdominal tenderness. There is no guarding.  Musculoskeletal: Normal range of motion.  Lymphadenopathy:     Head:     Right side of head: No submandibular adenopathy.     Left side of head: No submandibular adenopathy.     Cervical: No cervical adenopathy.  Skin:    General: Skin is warm and dry.  Neurological:     Mental Status: He is alert and oriented to person, place, and time.     Cranial Nerves: No cranial nerve deficit.     Sensory: No sensory deficit.  Psychiatric:        Speech: Speech normal.      ED Treatments / Results  Labs (all labs ordered are listed, but only abnormal results are displayed) Labs Reviewed - No data to display  EKG None  Radiology No results found.  Procedures Procedures (including critical care time)  Medications Ordered in ED Medications - No data to display   Initial Impression / Assessment and Plan / ED Course  I have reviewed the triage vital signs and the nursing notes.  Pertinent labs & imaging results that were available during my care of the patient were reviewed by me and considered in my medical decision making (see chart for details).           Final Clinical Impressions(s) / ED Diagnoses MDM  Patient presents to the emergency department with stable vital signs.  Patient reports congestion, cough, sore throat, diarrhea and body aches.  He says that he may have been exposed to someone with the COVID-19 virus.  He was also told at another emergency department that he probably had some peptic ulcer disease.  It is of note that patient is a diabetic.  We will check chemistries, COVID-19 test, and chest x-ray. Lab work pending.  Patient's care to be continued by Langston MaskerKaren Sofia, PA-C.   Final diagnoses:  Cough  Generalized abdominal pain    ED Discharge Orders    None       Ivery QualeBryant, Melody Savidge, PA-C 06/28/19 1957    Samuel JesterMcManus, Kathleen, DO 06/28/19 2030

## 2019-06-27 NOTE — ED Triage Notes (Addendum)
Pt states he was at [facility] ER 2 days ago for "ulcer" and this morning woke up having cough, congestion, sore throat, body aches, and diarrhea. Feels like he may have Covid because another pt there that day did.

## 2019-06-27 NOTE — ED Provider Notes (Signed)
Pt's care assumed by me at 5pm.  Pt's chest xray is normal.  Covid is negative,  Pt encouraged to continue taking prilosec and zofran for nausea.   Fransico Meadow, PA-C 06/27/19 [place], [place], DO 06/28/19 1530

## 2019-06-29 LAB — CBG MONITORING, ED: Glucose-Capillary: 141 mg/dL — ABNORMAL HIGH (ref 70–99)

## 2019-08-01 ENCOUNTER — Emergency Department (HOSPITAL_COMMUNITY)
Admission: EM | Admit: 2019-08-01 | Discharge: 2019-08-01 | Disposition: A | Discharge status: Home / Self Care | Payer: Medicaid - Out of State | Attending: Emergency Medicine | Admitting: Emergency Medicine

## 2019-08-01 ENCOUNTER — Encounter (HOSPITAL_COMMUNITY): Payer: Self-pay

## 2019-08-01 ENCOUNTER — Other Ambulatory Visit: Payer: Self-pay

## 2019-08-01 DIAGNOSIS — R109 Unspecified abdominal pain: Secondary | ICD-10-CM | POA: Insufficient documentation

## 2019-08-01 DIAGNOSIS — Z5321 Procedure and treatment not carried out due to patient leaving prior to being seen by health care provider: Secondary | ICD-10-CM | POA: Insufficient documentation

## 2019-08-01 LAB — BASIC METABOLIC PANEL
Anion gap: 12 (ref 5–15)
BUN: 20 mg/dL (ref 6–20)
CO2: 15 mmol/L — ABNORMAL LOW (ref 22–32)
Calcium: 9.3 mg/dL (ref 8.9–10.3)
Chloride: 110 mmol/L (ref 98–111)
Creatinine, Ser: 0.96 mg/dL (ref 0.61–1.24)
GFR calc Af Amer: >60 mL/min (ref >60)
GFR calc non Af Amer: >60 mL/min (ref >60)
Glucose, Bld: 104 mg/dL — ABNORMAL HIGH (ref 70–99)
Potassium: 4.3 mmol/L (ref 3.5–5.1)
Sodium: 137 mmol/L (ref 135–145)

## 2019-08-01 LAB — CBC
HCT: 46.5 % (ref 39.0–52.0)
Hemoglobin: 13.5 g/dL (ref 13.0–17.0)
MCH: 23.2 pg — ABNORMAL LOW (ref 26.0–34.0)
MCHC: 29 g/dL — ABNORMAL LOW (ref 30.0–36.0)
MCV: 79.8 fL — ABNORMAL LOW (ref 80.0–100.0)
Platelets: 236 10*3/uL (ref 150–400)
RBC: 5.83 MIL/uL — ABNORMAL HIGH (ref 4.22–5.81)
RDW: 15.8 % — ABNORMAL HIGH (ref 11.5–15.5)
WBC: 7.2 10*3/uL (ref 4.0–10.5)
nRBC: 0 % (ref 0.0–0.2)

## 2019-08-01 NOTE — ED Triage Notes (Signed)
Pt is having mid abdominal pain that is recurrent. States he was seen in [place] last night for same and all blood work and testing was normal. Wanting to be seen here for same. History of gastroparesis

## 2019-08-11 ENCOUNTER — Other Ambulatory Visit: Payer: Self-pay

## 2019-08-11 ENCOUNTER — Encounter (HOSPITAL_COMMUNITY): Payer: Self-pay | Admitting: Emergency Medicine

## 2019-08-11 ENCOUNTER — Observation Stay (HOSPITAL_COMMUNITY)
Admission: EM | Admit: 2019-08-11 | Discharge: 2019-08-13 | Disposition: A | Discharge status: Home / Self Care | Payer: Medicaid - Out of State | Attending: Family Medicine | Admitting: Family Medicine

## 2019-08-11 DIAGNOSIS — K861 Other chronic pancreatitis: Secondary | ICD-10-CM

## 2019-08-11 DIAGNOSIS — F319 Bipolar disorder, unspecified: Secondary | ICD-10-CM | POA: Diagnosis not present

## 2019-08-11 DIAGNOSIS — K859 Acute pancreatitis without necrosis or infection, unspecified: Principal | ICD-10-CM | POA: Diagnosis present

## 2019-08-11 DIAGNOSIS — Z20828 Contact with and (suspected) exposure to other viral communicable diseases: Secondary | ICD-10-CM | POA: Diagnosis not present

## 2019-08-11 DIAGNOSIS — Z87891 Personal history of nicotine dependence: Secondary | ICD-10-CM | POA: Insufficient documentation

## 2019-08-11 DIAGNOSIS — Z794 Long term (current) use of insulin: Secondary | ICD-10-CM | POA: Insufficient documentation

## 2019-08-11 DIAGNOSIS — F418 Other specified anxiety disorders: Secondary | ICD-10-CM | POA: Diagnosis present

## 2019-08-11 DIAGNOSIS — Z79899 Other long term (current) drug therapy: Secondary | ICD-10-CM | POA: Diagnosis present

## 2019-08-11 DIAGNOSIS — E119 Type 2 diabetes mellitus without complications: Secondary | ICD-10-CM

## 2019-08-11 NOTE — ED Triage Notes (Signed)
Pt reports he was admitted to [facility] in [place] for same last week, reports he was told his "liver enzymes were high and pancreatic levels were slightly off" , pt c/o abd pain, flank pain, back pain, headaches, n/d, blood in stools starting today

## 2019-08-12 ENCOUNTER — Emergency Department (HOSPITAL_COMMUNITY): Payer: Medicaid - Out of State

## 2019-08-12 DIAGNOSIS — K859 Acute pancreatitis without necrosis or infection, unspecified: Secondary | ICD-10-CM | POA: Diagnosis present

## 2019-08-12 LAB — COMPREHENSIVE METABOLIC PANEL
ALT: 44 U/L (ref 0–44)
ALT: 51 U/L — ABNORMAL HIGH (ref 0–44)
AST: 26 U/L (ref 15–41)
AST: 29 U/L (ref 15–41)
Albumin: 3.8 g/dL (ref 3.5–5.0)
Albumin: 4.5 g/dL (ref 3.5–5.0)
Alkaline Phosphatase: 107 U/L (ref 38–126)
Alkaline Phosphatase: 130 U/L — ABNORMAL HIGH (ref 38–126)
Anion gap: 4 — ABNORMAL LOW (ref 5–15)
Anion gap: 7 (ref 5–15)
BUN: 18 mg/dL (ref 6–20)
BUN: 20 mg/dL (ref 6–20)
CO2: 21 mmol/L — ABNORMAL LOW (ref 22–32)
CO2: 23 mmol/L (ref 22–32)
Calcium: 8.4 mg/dL — ABNORMAL LOW (ref 8.9–10.3)
Calcium: 9.1 mg/dL (ref 8.9–10.3)
Chloride: 110 mmol/L (ref 98–111)
Chloride: 114 mmol/L — ABNORMAL HIGH (ref 98–111)
Creatinine, Ser: 1.14 mg/dL (ref 0.61–1.24)
Creatinine, Ser: 1.28 mg/dL — ABNORMAL HIGH (ref 0.61–1.24)
GFR calc Af Amer: >60 mL/min (ref >60)
GFR calc Af Amer: >60 mL/min (ref >60)
GFR calc non Af Amer: >60 mL/min (ref >60)
GFR calc non Af Amer: >60 mL/min (ref >60)
Glucose, Bld: 117 mg/dL — ABNORMAL HIGH (ref 70–99)
Glucose, Bld: 118 mg/dL — ABNORMAL HIGH (ref 70–99)
Potassium: 3.8 mmol/L (ref 3.5–5.1)
Potassium: 4.1 mmol/L (ref 3.5–5.1)
Sodium: 139 mmol/L (ref 135–145)
Sodium: 140 mmol/L (ref 135–145)
Total Bilirubin: 0.2 mg/dL — ABNORMAL LOW (ref 0.3–1.2)
Total Bilirubin: 0.4 mg/dL (ref 0.3–1.2)
Total Protein: 6.1 g/dL — ABNORMAL LOW (ref 6.5–8.1)
Total Protein: 7.3 g/dL (ref 6.5–8.1)

## 2019-08-12 LAB — MAGNESIUM: Magnesium: 2.1 mg/dL (ref 1.7–2.4)

## 2019-08-12 LAB — GLUCOSE, CAPILLARY
Glucose-Capillary: 131 mg/dL — ABNORMAL HIGH (ref 70–99)
Glucose-Capillary: 115 mg/dL — ABNORMAL HIGH (ref 70–99)

## 2019-08-12 LAB — CBC WITH DIFFERENTIAL/PLATELET
Abs Immature Granulocytes: 0.03 10*3/uL (ref 0.00–0.07)
Basophils Absolute: 0 10*3/uL (ref 0.0–0.1)
Basophils Relative: 1 %
Eosinophils Absolute: 0.1 10*3/uL (ref 0.0–0.5)
Eosinophils Relative: 2 %
HCT: 38.9 % — ABNORMAL LOW (ref 39.0–52.0)
Hemoglobin: 11.5 g/dL — ABNORMAL LOW (ref 13.0–17.0)
Immature Granulocytes: 0 %
Lymphocytes Relative: 42 %
Lymphs Abs: 3 10*3/uL (ref 0.7–4.0)
MCH: 23.7 pg — ABNORMAL LOW (ref 26.0–34.0)
MCHC: 29.6 g/dL — ABNORMAL LOW (ref 30.0–36.0)
MCV: 80 fL (ref 80.0–100.0)
Monocytes Absolute: 0.7 10*3/uL (ref 0.1–1.0)
Monocytes Relative: 10 %
Neutro Abs: 3.3 10*3/uL (ref 1.7–7.7)
Neutrophils Relative %: 45 %
Platelets: 237 10*3/uL (ref 150–400)
RBC: 4.86 MIL/uL (ref 4.22–5.81)
RDW: 17.7 % — ABNORMAL HIGH (ref 11.5–15.5)
WBC: 7.2 10*3/uL (ref 4.0–10.5)
nRBC: 0 % (ref 0.0–0.2)

## 2019-08-12 LAB — RAPID URINE DRUG SCREEN, HOSP PERFORMED
Amphetamines: NOT DETECTED
Barbiturates: NOT DETECTED
Benzodiazepines: NOT DETECTED
Cocaine: NOT DETECTED
Opiates: NOT DETECTED
Tetrahydrocannabinol: NOT DETECTED

## 2019-08-12 LAB — SARS CORONAVIRUS 2 (TAT 6-24 HRS): SARS Coronavirus 2: NEGATIVE

## 2019-08-12 LAB — HEMOGLOBIN A1C
Hgb A1c MFr Bld: 7.5 % — ABNORMAL HIGH (ref 4.8–5.6)
Hgb A1c MFr Bld: 7.4 % — ABNORMAL HIGH (ref 4.8–5.6)
Mean Plasma Glucose: 168.55 mg/dL
Mean Plasma Glucose: 165.68 mg/dL

## 2019-08-12 LAB — CBC
HCT: 36.4 % — ABNORMAL LOW (ref 39.0–52.0)
Hemoglobin: 10.8 g/dL — ABNORMAL LOW (ref 13.0–17.0)
MCH: 23.6 pg — ABNORMAL LOW (ref 26.0–34.0)
MCHC: 29.7 g/dL — ABNORMAL LOW (ref 30.0–36.0)
MCV: 79.6 fL — ABNORMAL LOW (ref 80.0–100.0)
Platelets: 209 10*3/uL (ref 150–400)
RBC: 4.57 MIL/uL (ref 4.22–5.81)
RDW: 17.6 % — ABNORMAL HIGH (ref 11.5–15.5)
WBC: 5.1 10*3/uL (ref 4.0–10.5)
nRBC: 0 % (ref 0.0–0.2)

## 2019-08-12 LAB — URINALYSIS, ROUTINE W REFLEX MICROSCOPIC
Bacteria, UA: NONE SEEN
Bilirubin Urine: NEGATIVE
Glucose, UA: >=500 mg/dL — AB
Hgb urine dipstick: NEGATIVE
Ketones, ur: NEGATIVE mg/dL
Leukocytes,Ua: NEGATIVE
Nitrite: NEGATIVE
Protein, ur: NEGATIVE mg/dL
Specific Gravity, Urine: 1.018 (ref 1.005–1.030)
pH: 7 (ref 5.0–8.0)

## 2019-08-12 LAB — PHOSPHORUS: Phosphorus: 3.8 mg/dL (ref 2.5–4.6)

## 2019-08-12 LAB — TROPONIN I (HIGH SENSITIVITY)
Troponin I (High Sensitivity): 2 ng/L (ref <18)
Troponin I (High Sensitivity): 2 ng/L (ref <18)

## 2019-08-12 LAB — LACTATE DEHYDROGENASE: LDH: 152 U/L (ref 98–192)

## 2019-08-12 LAB — LIPASE, BLOOD: Lipase: 37 U/L (ref 11–51)

## 2019-08-12 LAB — POC OCCULT BLOOD, ED: Fecal Occult Bld: NEGATIVE

## 2019-08-12 MED ORDER — PRAZOSIN HCL 5 MG PO CAPS
5.0000 mg | ORAL_CAPSULE | Freq: Every day | ORAL | Status: DC
  Filled 2019-08-12 (×2): qty 1

## 2019-08-12 MED ORDER — PREGABALIN 75 MG PO CAPS
150.0000 mg | ORAL_CAPSULE | Freq: Two times a day (BID) | ORAL | Status: DC
  Administered 2019-08-12 – 2019-08-13 (×3): 150 mg via ORAL
  Filled 2019-08-12 (×3): qty 2

## 2019-08-12 MED ORDER — PANTOPRAZOLE SODIUM 40 MG PO TBEC
40.0000 mg | DELAYED_RELEASE_TABLET | Freq: Two times a day (BID) | ORAL | Status: DC
  Administered 2019-08-12 – 2019-08-13 (×3): 40 mg via ORAL
  Filled 2019-08-12 (×3): qty 1

## 2019-08-12 MED ORDER — HYDROXYZINE HCL 25 MG PO TABS
50.0000 mg | ORAL_TABLET | Freq: Four times a day (QID) | ORAL | Status: DC
  Administered 2019-08-12 – 2019-08-13 (×5): 50 mg via ORAL
  Filled 2019-08-12 (×5): qty 2

## 2019-08-12 MED ORDER — DOCUSATE SODIUM 100 MG PO CAPS
100.0000 mg | ORAL_CAPSULE | Freq: Every day | ORAL | Status: DC
  Administered 2019-08-12: 13:00:00 100 mg via ORAL
  Filled 2019-08-12: qty 1

## 2019-08-12 MED ORDER — PROPRANOLOL HCL 20 MG PO TABS
40.0000 mg | ORAL_TABLET | Freq: Two times a day (BID) | ORAL | Status: DC
  Administered 2019-08-12 – 2019-08-13 (×2): 40 mg via ORAL
  Filled 2019-08-12 (×3): qty 2

## 2019-08-12 MED ORDER — VITAMIN D 25 MCG (1000 UNIT) PO TABS
ORAL_TABLET | ORAL | Status: DC
  Administered 2019-08-12: 13:00:00 1000 [IU] via ORAL
  Filled 2019-08-12: qty 1

## 2019-08-12 MED ORDER — INSULIN DETEMIR 100 UNIT/ML ~~LOC~~ SOLN
30.0000 [IU] | Freq: Every day | SUBCUTANEOUS | Status: DC
  Administered 2019-08-12: 30 [IU] via SUBCUTANEOUS
  Filled 2019-08-12 (×2): qty 0.3

## 2019-08-12 MED ORDER — MORPHINE SULFATE (PF) 4 MG/ML IV SOLN
4.0000 mg | Freq: Once | INTRAVENOUS | Status: AC
  Administered 2019-08-12: 4 mg via INTRAVENOUS
  Filled 2019-08-12: qty 1

## 2019-08-12 MED ORDER — ONDANSETRON HCL 4 MG/2ML IJ SOLN
4.0000 mg | Freq: Once | INTRAMUSCULAR | Status: AC
  Administered 2019-08-12: 02:00:00 4 mg via INTRAVENOUS
  Filled 2019-08-12: qty 2

## 2019-08-12 MED ORDER — LEVOCETIRIZINE DIHYDROCHLORIDE 5 MG PO TABS: 5.0000 mg | ORAL_TABLET | Freq: Every evening | ORAL | Status: DC

## 2019-08-12 MED ORDER — MORPHINE SULFATE (PF) 2 MG/ML IV SOLN
2.0000 mg | INTRAVENOUS | Status: DC | PRN
  Administered 2019-08-12 – 2019-08-13 (×3): 2 mg via INTRAVENOUS
  Filled 2019-08-12 (×3): qty 1

## 2019-08-12 MED ORDER — ACETAMINOPHEN 325 MG PO TABS: 650.0000 mg | ORAL_TABLET | Freq: Four times a day (QID) | ORAL | Status: DC | PRN

## 2019-08-12 MED ORDER — IOHEXOL 300 MG/ML  SOLN
75.0000 mL | Freq: Once | INTRAMUSCULAR | Status: AC | PRN
  Administered 2019-08-12: 01:00:00 75 mL via INTRAVENOUS

## 2019-08-12 MED ORDER — LORATADINE 10 MG PO TABS
10.0000 mg | ORAL_TABLET | Freq: Every day | ORAL | Status: DC
  Administered 2019-08-12 – 2019-08-13 (×2): 10 mg via ORAL
  Filled 2019-08-12 (×2): qty 1

## 2019-08-12 MED ORDER — ESCITALOPRAM OXALATE 10 MG PO TABS
20.0000 mg | ORAL_TABLET | Freq: Every day | ORAL | Status: DC
  Administered 2019-08-12 – 2019-08-13 (×2): 20 mg via ORAL
  Filled 2019-08-12 (×2): qty 2

## 2019-08-12 MED ORDER — INSULIN ASPART 100 UNIT/ML ~~LOC~~ SOLN
0.0000 [IU] | Freq: Three times a day (TID) | SUBCUTANEOUS | Status: DC
  Administered 2019-08-13: 3 [IU] via SUBCUTANEOUS

## 2019-08-12 MED ORDER — HEPARIN SODIUM (PORCINE) 5000 UNIT/ML IJ SOLN
5000.0000 [IU] | Freq: Three times a day (TID) | INTRAMUSCULAR | Status: DC
  Administered 2019-08-12 – 2019-08-13 (×3): 5000 [IU] via SUBCUTANEOUS
  Filled 2019-08-12 (×3): qty 1

## 2019-08-12 MED ORDER — MORPHINE SULFATE (PF) 2 MG/ML IV SOLN
1.0000 mg | INTRAVENOUS | Status: DC | PRN
  Administered 2019-08-12 (×3): 1 mg via INTRAVENOUS
  Filled 2019-08-12 (×3): qty 1

## 2019-08-12 MED ORDER — TOPIRAMATE 100 MG PO TABS
100.0000 mg | ORAL_TABLET | Freq: Two times a day (BID) | ORAL | Status: DC
  Administered 2019-08-12 – 2019-08-13 (×3): 100 mg via ORAL
  Filled 2019-08-12 (×3): qty 1

## 2019-08-12 MED ORDER — PANTOPRAZOLE SODIUM 40 MG PO TBEC: 40.0000 mg | DELAYED_RELEASE_TABLET | Freq: Every day | ORAL | Status: DC

## 2019-08-12 MED ORDER — SODIUM CHLORIDE 0.9 % IV SOLN
INTRAVENOUS | Status: DC
  Administered 2019-08-12 (×2): via INTRAVENOUS

## 2019-08-12 MED ORDER — MONTELUKAST SODIUM 10 MG PO TABS
10.0000 mg | ORAL_TABLET | Freq: Every day | ORAL | Status: DC
  Administered 2019-08-12: 10 mg via ORAL
  Filled 2019-08-12: qty 1

## 2019-08-12 MED ORDER — ACETAMINOPHEN 650 MG RE SUPP: 650.0000 mg | Freq: Four times a day (QID) | RECTAL | Status: DC | PRN

## 2019-08-12 MED ORDER — SODIUM CHLORIDE 0.9 % IV BOLUS
1000.0000 mL | Freq: Once | INTRAVENOUS | Status: AC
  Administered 2019-08-12: 01:00:00 1000 mL via INTRAVENOUS

## 2019-08-12 MED ORDER — OXYBUTYNIN CHLORIDE 5 MG PO TABS: 1.0000 mg | ORAL_TABLET | Freq: Three times a day (TID) | ORAL | Status: DC

## 2019-08-12 MED ORDER — OXYBUTYNIN CHLORIDE 5 MG PO TABS
5.0000 mg | ORAL_TABLET | Freq: Three times a day (TID) | ORAL | Status: DC
  Administered 2019-08-12 – 2019-08-13 (×3): 5 mg via ORAL
  Filled 2019-08-12 (×3): qty 1

## 2019-08-12 MED ORDER — ARIPIPRAZOLE 5 MG PO TABS
15.0000 mg | ORAL_TABLET | Freq: Every day | ORAL | Status: DC
  Administered 2019-08-12 – 2019-08-13 (×2): 15 mg via ORAL
  Filled 2019-08-12 (×2): qty 1

## 2019-08-12 MED ORDER — FENTANYL CITRATE (PF) 100 MCG/2ML IJ SOLN
50.0000 ug | Freq: Once | INTRAMUSCULAR | Status: AC
  Administered 2019-08-12: 50 ug via INTRAVENOUS
  Filled 2019-08-12: qty 2

## 2019-08-12 MED ORDER — ONDANSETRON HCL 4 MG/2ML IJ SOLN
4.0000 mg | Freq: Once | INTRAMUSCULAR | Status: AC
  Administered 2019-08-12: 4 mg via INTRAVENOUS
  Filled 2019-08-12: qty 2

## 2019-08-12 NOTE — Plan of Care (Signed)

## 2019-08-12 NOTE — Progress Notes (Signed)
Patient blood pressure 107/63 pulse 55. Paged mid level. Mid level gave verbal order to hold 2200 propranolol dose and minipress dose. Will continue to monitor throughout shift.

## 2019-08-12 NOTE — Progress Notes (Signed)
  Patient seen and evaluated, chart reviewed, please see EMR for updated orders. Please see full H&P dictated by admitting physician Dr.Zierle-Ghosh for same date of service.   Records from [facility] in [place] reviewed  Acute on Chronic Pancreatitis-- -okay to try liquid diet, continue IV fluids, PRN morphine sulfate -Protonix  DM2-A1c 7.4, continue Levemir insulin 30 units nightly, sliding scale as ordered  Schizoaffective disorder/anxiety--- stable, continue Abilify 15 mg daily, continue Lexapro 20 mg daily, continue hydroxyzine 50 mg 4 times a day under milligrams twice daily  Chronic anemia--hemoglobin stable around 11 which is close to patient's baseline, stool occult blood negative, no evidence of ongoing bleeding  H/o osteomyelitis of left foot,--wound VAC in situ, patient states he is status post bone graft--- will get general surgery consult  Roxan Hockey, MD

## 2019-08-12 NOTE — ED Notes (Signed)
Patient transported to CT 

## 2019-08-12 NOTE — Consult Note (Signed)
[facility] Nurse wound consult note Reason for Consult: Left foot wound with NPWT dressing in place, no device. It is not clear to the Nursing staff for how long the dressing has been disconnected from negative pressure.  Since it is inadvisable to leave the device disconnect from negative pressure longer than 2 hours, I will recommend that the dressing be removed, the wound cleansed with NS, and a saline moistened gauze dressing be placed. Saline dampened dressings should be applied twice daily until a physician can provide assessment and additional orders. It is noted that this is not the reason for the patient's presentation to the ED.  Wound type:Neuropathic, surgical Pressure Injury POA: N/A  Patient is followed by Dr. Blane Ohara, DPM of the [facility], Orthopedics, a Department of [facility] in [place].  He was last seen in his office on 07/27/2019. He is being followed by a [facility] who assists with NPWT dressing changes.  According to Dr. Talmadge Coventry note, general surgery has been consulted. Their orders will supercede my guidance to the Nursing staff and I defer to their expertise.  [facility] nursing team will not follow, but will remain available to this patient, the nursing and medical teams.  Please re-consult if needed. Thanks, Maudie Flakes, MSN, RN, [place], Arther Abbott  Pager# [phone]

## 2019-08-12 NOTE — ED Notes (Signed)
ED TO INPATIENT HANDOFF REPORT  ED Nurse Name and Phone #: [phone], [name], RN  S Name/Age/Gender Dennis HeysJames Yang 33 y.o. male Room/Bed: APA18/APA18  Code Status   Code Status: Prior  Home/SNF/Other Home Patient oriented to: self, place, time and situation Is this baseline? Yes   Triage Complete: Triage complete  Chief Complaint Flank Pain  Triage Note Pt reports he was admitted to [facility] in [place] for same last week, reports he was told his "liver enzymes were high and pancreatic levels were slightly off" , pt c/o abd pain, flank pain, back pain, headaches, n/d, blood in stools starting today    Allergies Allergies  Allergen Reactions  . Penicillins Anaphylaxis    Did it involve swelling of the face/tongue/throat, SOB, or low BP? Yes Did it involve sudden or severe rash/hives, skin peeling, or any reaction on the inside of your mouth or nose? Yes Did you need to seek medical attention at a hospital or doctor's office? Yes When did it last happen? If all above answers are "NO", may proceed with cephalosporin use.   . Ciprofloxacin Hives    Level of Care/Admitting Diagnosis ED Disposition    ED Disposition Condition Comment   Admit  Hospital Area: [facility] [[id number]]  Level of Care: Med-Surg [16]  Covid Evaluation: Asymptomatic Screening Protocol (No Symptoms)  Diagnosis: Acute recurrent pancreatitis [[id number]][[id number]]  Admitting Physician: Lilyan GilfordZIERLE-GHOSH, ASIA B [[id number]]  Attending Physician: Lilyan GilfordZIERLE-GHOSH, ASIA B [[id number]]  PT Class (Do Not Modify): Observation [104]  PT Acc Code (Do Not Modify): Observation [10022]       B Medical/Surgery History Past Medical History:  Diagnosis Date  . Anxiety   . Back pain   . Bipolar 1 disorder (HCC)   . Depression   . Diabetes mellitus without complication (HCC)   . Incontinence of bowel   . Incontinence of urine   . Neck pain   . Osteoarthritis   . Osteonecrosis (HCC)    left foot  .  Schizoaffective disorder [facility](HCC)    Past Surgical History:  Procedure Laterality Date  . botox injections in bladder    . CHOLECYSTECTOMY    . ELBOW FRACTURE SURGERY    . FOOT SURGERY    . HERNIA REPAIR    . LITHOTRIPSY    . NASAL SINUS SURGERY       A IV Location/Drains/Wounds Patient Lines/Drains/Airways Status   Active Line/Drains/Airways    Name:   Placement date:   Placement time:   Site:   Days:   Peripheral IV 08/12/19 Right Forearm   08/12/19    0026    Forearm   less than 1          Intake/Output Last 24 hours  Intake/Output Summary (Last 24 hours) at 08/12/2019 [id number] Last data filed at 08/12/2019 0218 Gross per 24 hour  Intake 1000 ml  Output -  Net 1000 ml    Labs/Imaging Results for orders placed or performed during the hospital encounter of 08/11/19 (from the past 48 hour(s))  CBC with Differential/Platelet     Status: Abnormal   Collection Time: 08/12/19 12:06 AM  Result Value Ref Range   WBC 7.2 4.0 - 10.5 K/uL   RBC 4.86 4.22 - 5.81 MIL/uL   Hemoglobin 11.5 (L) 13.0 - 17.0 g/dL   HCT 78.438.9 (L) 69.639.0 - 29.552.0 %   MCV 80.0 80.0 - 100.0 fL   MCH 23.7 (L) 26.0 - 34.0 pg   MCHC 29.6 (L) 30.0 -  36.0 g/dL   RDW 40.0 (H) 86.7 - 61.9 %   Platelets 237 150 - 400 K/uL   nRBC 0.0 0.0 - 0.2 %   Neutrophils Relative % 45 %   Neutro Abs 3.3 1.7 - 7.7 K/uL   Lymphocytes Relative 42 %   Lymphs Abs 3.0 0.7 - 4.0 K/uL   Monocytes Relative 10 %   Monocytes Absolute 0.7 0.1 - 1.0 K/uL   Eosinophils Relative 2 %   Eosinophils Absolute 0.1 0.0 - 0.5 K/uL   Basophils Relative 1 %   Basophils Absolute 0.0 0.0 - 0.1 K/uL   Immature Granulocytes 0 %   Abs Immature Granulocytes 0.03 0.00 - 0.07 K/uL    Comment: Performed at [facility], [address], [place], [place] [address]  Comprehensive metabolic panel     Status: Abnormal   Collection Time: 08/12/19 12:06 AM  Result Value Ref Range   Sodium 140 135 - 145 mmol/L   Potassium 3.8 3.5 - 5.1 mmol/L   Chloride 110 98 -  111 mmol/L   CO2 23 22 - 32 mmol/L   Glucose, Bld 118 (H) 70 - 99 mg/dL   BUN 20 6 - 20 mg/dL   Creatinine, Ser 6.71 (H) 0.61 - 1.24 mg/dL   Calcium 9.1 8.9 - 24.5 mg/dL   Total Protein 7.3 6.5 - 8.1 g/dL   Albumin 4.5 3.5 - 5.0 g/dL   AST 29 15 - 41 U/L   ALT 51 (H) 0 - 44 U/L   Alkaline Phosphatase 130 (H) 38 - 126 U/L   Total Bilirubin 0.2 (L) 0.3 - 1.2 mg/dL   GFR calc non Af Amer >60 >60 mL/min   GFR calc Af Amer >60 >60 mL/min   Anion gap 7 5 - 15    Comment: Performed at [facility], [address] [place], [place] [address]  Lipase, blood     Status: None   Collection Time: 08/12/19 12:06 AM  Result Value Ref Range   Lipase 37 11 - 51 U/L    Comment: Performed at [facility], [address] [place], [place] [address]  Troponin I (High Sensitivity)     Status: None   Collection Time: 08/12/19 12:12 AM  Result Value Ref Range   Troponin I (High Sensitivity) 2 <18 ng/L    Comment: (NOTE) Elevated high sensitivity troponin I (hsTnI) values and significant  changes across serial measurements may suggest ACS but many other  chronic and acute conditions are known to elevate hsTnI results.  Refer to the "Links" section for chest pain algorithms and additional  guidance. Performed at [facility], [address] [place], [place] [address]   POC occult blood, ED Provider will collect     Status: None   Collection Time: 08/12/19 12:25 AM  Result Value Ref Range   Fecal Occult Bld NEGATIVE NEGATIVE  Urinalysis, Routine w reflex microscopic     Status: Abnormal   Collection Time: 08/12/19 12:50 AM  Result Value Ref Range   Color, Urine YELLOW YELLOW   APPearance CLOUDY (A) CLEAR   Specific Gravity, Urine 1.018 1.005 - 1.030   pH 7.0 5.0 - 8.0   Glucose, UA >=500 (A) NEGATIVE mg/dL   Hgb urine dipstick NEGATIVE NEGATIVE   Bilirubin Urine NEGATIVE NEGATIVE   Ketones, ur NEGATIVE NEGATIVE mg/dL   Protein, ur NEGATIVE NEGATIVE mg/dL   Nitrite NEGATIVE NEGATIVE    Leukocytes,Ua NEGATIVE NEGATIVE   RBC / HPF 0-5 0 - 5 RBC/hpf  WBC, UA 0-5 0 - 5 WBC/hpf   Bacteria, UA NONE SEEN NONE SEEN   Budding Yeast PRESENT     Comment: Performed at [facility], [address], [place], [place] [address]  Rapid urine drug screen (hospital performed)     Status: None   Collection Time: 08/12/19 12:50 AM  Result Value Ref Range   Opiates NONE DETECTED NONE DETECTED   Cocaine NONE DETECTED NONE DETECTED   Benzodiazepines NONE DETECTED NONE DETECTED   Amphetamines NONE DETECTED NONE DETECTED   Tetrahydrocannabinol NONE DETECTED NONE DETECTED   Barbiturates NONE DETECTED NONE DETECTED    Comment: (NOTE) DRUG SCREEN FOR MEDICAL PURPOSES ONLY.  IF CONFIRMATION IS NEEDED FOR ANY PURPOSE, NOTIFY LAB WITHIN 5 DAYS. LOWEST DETECTABLE LIMITS FOR URINE DRUG SCREEN Drug Class                     Cutoff (ng/mL) Amphetamine and metabolites    1000 Barbiturate and metabolites    200 Benzodiazepine                 200 Tricyclics and metabolites     300 Opiates and metabolites        300 Cocaine and metabolites        300 THC                            50 Performed at [facility], [address] [place], [place] [address]   Troponin I (High Sensitivity)     Status: None   Collection Time: 08/12/19  2:14 AM  Result Value Ref Range   Troponin I (High Sensitivity) 2 <18 ng/L    Comment: (NOTE) Elevated high sensitivity troponin I (hsTnI) values and significant  changes across serial measurements may suggest ACS but many other  chronic and acute conditions are known to elevate hsTnI results.  Refer to the "Links" section for chest pain algorithms and additional  guidance. Performed at [facility], [address] [place] [address], [place], [place] [address]   Lactate dehydrogenase     Status: None   Collection Time: 08/12/19  2:14 AM  Result Value Ref Range   LDH 152 98 - 192 U/L    Comment: Performed at [facility], [address], [place], [place] [address]   Ct Abdomen Pelvis  W Contrast  Result Date: 08/12/2019 CLINICAL DATA:  Pancreatitis suspected, atypical presentation/labs Abdominal and flank pain. Back pain. Headache. Nausea and diarrhea. Blood in stool. EXAM: CT ABDOMEN AND PELVIS WITH CONTRAST TECHNIQUE: Multidetector CT imaging of the abdomen and pelvis was performed using the standard protocol following bolus administration of intravenous contrast. CONTRAST:  75mL OMNIPAQUE IOHEXOL 300 MG/ML  SOLN COMPARISON:  Multiple prior exams most recently 05/15/2019. FINDINGS: Lower chest: Linear atelectasis in the right lower lobe. Additional hypoventilatory changes dependently. No pleural fluid. Hepatobiliary: Decreased hepatic density consistent with steatosis. No focal abnormality. Clips in the gallbladder fossa postcholecystectomy. No biliary dilatation. Pancreas: Calcifications in the pancreatic head and uncinate process again seen. No ductal dilatation or inflammation. Spleen: Normal in size without focal abnormality. Adrenals/Urinary Tract: Normal adrenal glands. No perinephric edema of either kidney. Multiple nonobstructing stones in the right kidney. Mild right ureteral prominence without frank hydronephrosis. No right ureteral stone. Punctate nonobstructing stones in the lower left kidney. Mild left pelvicaliectasis and ureteral prominence without ureteral stone. Urinary bladder is physiologically distended. No bladder stone or wall thickening. Stomach/Bowel: Stomach is distended with ingested  contents. Appendix appears normal. No evidence of bowel wall thickening, distention, or inflammatory changes. Vascular/Lymphatic: Multiple small retroperitoneal and central mesenteric nodes, not enlarged by size criteria. Portal vein is patent. Splenic vein is patent. Normal caliber abdominal aorta. No acute vascular findings. Reproductive: Prostate is unremarkable. Other: No free air, free fluid, or intra-abdominal fluid collection. Small fat containing umbilical hernia. Musculoskeletal:  Interval callus formation about the left iliac bone since prior exam. Scoliotic curvature of spine. There are no acute or suspicious osseous abnormalities. IMPRESSION: 1. Pancreatic head calcifications consistent with chronic pancreatitis. No acute pancreatic inflammation. 2. Bilateral nonobstructing renal calculi. Bilateral ureteral prominence without hydronephrosis or obstructing stone. 3. Hepatic steatosis. Electronically Signed   By: Keith Rake M.D.   On: 08/12/2019 01:53    Pending Labs Unresulted Labs (From admission, onward)    Start     Ordered   08/12/19 0413  SARS CORONAVIRUS 2 (TAT 6-24 HRS) Nasopharyngeal Nasopharyngeal Swab  (Asymptomatic/Tier 2)  Once,   STAT    Question Answer Comment  Is this test for diagnosis or screening Screening   Symptomatic for COVID-19 as defined by CDC No   Hospitalized for COVID-19 No   Admitted to ICU for COVID-19 No   Previously tested for COVID-19 Unknown   Resident in a congregate (group) care setting No   Employed in healthcare setting No      08/12/19 0413   08/12/19 0357  Hemoglobin A1c  Once,   STAT    Comments: To assess prior glycemic control    08/12/19 0356   Signed and Held  CBC  (heparin)  Once,   R    Comments: Baseline for heparin therapy IF NOT ALREADY DRAWN.  Notify MD if PLT < 100 K.    Signed and Held   Signed and Held  Creatinine, serum  (heparin)  Once,   R    Comments: Baseline for heparin therapy IF NOT ALREADY DRAWN.    Signed and Held   Signed and Held  Magnesium  Tomorrow morning,   R     Signed and Held   Signed and Held  Phosphorus  Tomorrow morning,   R     Signed and Held   Signed and Held  Hemoglobin A1c  Tomorrow morning,   R     Signed and Held   Signed and Held  CBC  Tomorrow morning,   R     Signed and Held   Signed and Held  Comprehensive metabolic panel  Tomorrow morning,   R     Signed and Held          Vitals/Pain Today's Vitals   08/12/19 0400 08/12/19 0430 08/12/19 0500 08/12/19  0530  BP: 113/67 111/67 (!) 112/59 112/63  Pulse: (!) 52 (!) 57 (!) 53 66  Resp: 11 12 (!) 6 10  Temp:      TempSrc:      SpO2: 96% 98% 97% 97%  Weight:      Height:      PainSc:        Isolation Precautions No active isolations  Medications Medications  insulin detemir (LEVEMIR) injection 30 Units (has no administration in time range)  insulin aspart (novoLOG) injection 0-15 Units (has no administration in time range)  sodium chloride 0.9 % bolus 1,000 mL (0 mLs Intravenous Stopped 08/12/19 0218)  ondansetron (ZOFRAN) injection 4 mg (4 mg Intravenous Given 08/12/19 0037)  morphine 4 MG/ML injection 4 mg (4 mg Intravenous Given 08/12/19  0038)  iohexol (OMNIPAQUE) 300 MG/ML solution 75 mL (75 mLs Intravenous Contrast Given 08/12/19 0116)  fentaNYL (SUBLIMAZE) injection 50 mcg (50 mcg Intravenous Given 08/12/19 0228)  ondansetron (ZOFRAN) injection 4 mg (4 mg Intravenous Given 08/12/19 0228)    Mobility walks Low fall risk   Focused Assessments    R Recommendations: See Admitting Provider Note  Report given to:   Additional Notes:

## 2019-08-12 NOTE — H&P (Signed)
TRH H&P    Patient Demographics:    Dennis Yang, is a 33 y.o. male  MRN: [id number]  DOB - 25-Apr-1986  Admit Date - 08/11/2019  Referring MD/NP/PA: Dr. Manus Gunning   Outpatient Primary MD for the patient is Becky Sax, DO  Patient coming from: Home  Chief complaint-abdominal pain   HPI:    Dennis Yang  is a 33 y.o. male, with history of schizoaffective disorder, osteonecrosis, incontinence of bowel and bladder, diabetes mellitus, anxiety, and recurrent pancreatitis.  Patient reports that he has had about 30 spells of pancreatitis since he was diagnosed in 2018.  Today patient reports that he had just eaten dinner when he started having abdominal pain 8 PM.  Dinner was a ham and cheese sub.  It was a left upper quadrant and a burning sensation.  Patient did not associate with fever, chest pain, palpitations, diarrhea, constipation.  He did associated with nausea.  Patient reports that he is supposed to have nausea medicine at home, but could not find it, so they decided to come into the ER.  Patient reports that the pain is 10 out of 10.  Patient reports he has had a cholecystectomy since he was diagnosed with pancreatitis for the first time.  He is not on any new medications since he was diagnosed with pancreatitis the first time.  He has not been on steroids, he does not drink alcohol, he does not report any trauma to his pancreas, courser no scorpion bites, he does have high cholesterol which he takes a statin for.  Medication list reviewed and the only medication that could be at higher risk of causing pancreatitis his the simvastatin.  ED course  Urine tox screen did not detect any substances.  Occult fecal blood was negative.  CT abdomen and pelvis showed #1 pancreatic head calcifications consistent with chronic pancreatitis.  No acute pancreatic inflammation.  #2 bilateral nonobstructing renal calculi.  Bilateral  ureteral prominence without hydronephrosis or obstructing stone. 3.  Hepatic steatosis urinalysis showed glycosuria, but otherwise is not suspicious for an infection.  Chemistry profile revealed a glucose of 118, creatinine of 1.28, alkaline phosphatase of 130, AST 29 ALT 51, troponin stable x2.  Hematology shows no leukocytosis, does show anemia of 11.5 hemoglobin.  Patient was recently admitted at [facility] his hemoglobin there was 10.9.  His admission there was from August 24-26 and he was treated for acute on chronic pancreatitis.   Review of systems:    In addition to the HPI above,  No Fever-chills, No Headache, No changes with Vision or hearing, No problems swallowing food or Liquids, No Chest pain, Cough or Shortness of Breath, Positive for abdominal pain nausea and vomiting one episode of bloody diarrhea,  no Blood in stool or Urine, No dysuria, No new skin rashes or bruises, No new joints pains-aches,  No new weakness, tingling, numbness in any extremity, No recent weight gain or loss, No polyuria, polydypsia or polyphagia, No significant Mental Stressors.  All other systems reviewed and are negative.  Past History of the following :    Past Medical History:  Diagnosis Date   Anxiety    Back pain    Bipolar 1 disorder (HCC)    Depression    Diabetes mellitus without complication (HCC)    Incontinence of bowel    Incontinence of urine    Neck pain    Osteoarthritis    Osteonecrosis (HCC)    left foot   Schizoaffective disorder (HCC)       Past Surgical History:  Procedure Laterality Date   botox injections in bladder     CHOLECYSTECTOMY     ELBOW FRACTURE SURGERY     FOOT SURGERY     HERNIA REPAIR     LITHOTRIPSY     NASAL SINUS SURGERY        Social History:      Social History   Tobacco Use   Smoking status: Former Smoker   Smokeless tobacco: Never Used  Substance Use Topics   Alcohol use: Not Currently        Family History :    History reviewed. No pertinent family history.    Home Medications:   Prior to Admission medications   Medication Sig Start Date End Date Taking? Authorizing Provider  ARIPiprazole (ABILIFY) 10 MG tablet Take 15 mg by mouth daily.  01/22/19   [provider]  ATROVENT HFA 17 MCG/ACT inhaler Inhale 1 puff into the lungs every 4 (four) hours as needed for wheezing.  01/16/19   [provider]  CARAFATE 1 g tablet Take 1 g by mouth 2 (two) times daily.  12/20/18   [provider]  celecoxib (CELEBREX) 100 MG capsule Take 100 mg by mouth 2 (two) times daily.  01/12/19   [provider]  Cholecalciferol (VITAMIN D3) 1.25 MG (50000 UT) CAPS Take 1 capsule by mouth every Monday, Wednesday, and Friday. 3 times a week 12/24/18   [provider]  docusate sodium (COLACE) 100 MG capsule Take 100 mg by mouth daily.  01/01/19   [provider]  EMGALITY 120 MG/ML SOAJ Inject 1 Dose into the muscle every 30 (thirty) days.  11/24/18   [provider]  escitalopram (LEXAPRO) 20 MG tablet Take 20 mg by mouth daily.  01/22/19   [provider]  FARXIGA 10 MG TABS tablet Take 10 mg by mouth daily.  01/07/19   [provider]  fluticasone (FLONASE) 50 MCG/ACT nasal spray Place 1 spray into both nostrils daily.  01/20/19   [provider]  glipiZIDE (GLUCOTROL) 10 MG tablet Take 10 mg by mouth 2 (two) times daily. 01/19/19   [provider]  hydrOXYzine (ATARAX/VISTARIL) 50 MG tablet Take 50 mg by mouth 4 (four) times daily. 01/22/19   [provider]  Insulin Detemir (LEVEMIR FLEXTOUCH) 100 UNIT/ML Pen Inject 10-30 Units into the skin See admin instructions. 30 units in the morning and 10 units at bedtime 03/31/19   [provider]  levocetirizine (XYZAL) 5 MG tablet Take 5 mg by mouth every evening.  01/15/19   [provider]  montelukast (SINGULAIR) 10 MG tablet Take 10 mg by  mouth daily.  01/12/19   [provider]  NOVOLOG FLEXPEN 100 UNIT/ML FlexPen Inject 3 Units into the skin 3 (three) times daily with meals. 04/27/19   [provider]  omeprazole (PRILOSEC) 40 MG capsule Take 1 capsule (40 mg total) by mouth daily. 01/28/19   Shon Hale, MD  oxybutynin (DITROPAN) 5 MG  tablet Take 1 mg by mouth 3 (three) times daily. 03/31/19   [provider]  prazosin (MINIPRESS) 5 MG capsule Take 5 mg by mouth at bedtime.  01/22/19   [provider]  pregabalin (LYRICA) 150 MG capsule Take 150 mg by mouth 2 (two) times daily. 06/08/19   [provider]  propranolol (INDERAL) 40 MG tablet Take 40 mg by mouth 2 (two) times daily.  01/12/19   [provider]  simvastatin (ZOCOR) 20 MG tablet Take 20 mg by mouth daily at 6 PM.  12/25/18   [provider]  topiramate (TOPAMAX) 100 MG tablet Take 100 mg by mouth 2 (two) times daily. 05/18/19   [provider]  XIIDRA 5 % SOLN Place 2 drops into both eyes daily.  01/21/19   [provider]     Allergies:     Allergies  Allergen Reactions   Penicillins Anaphylaxis    Did it involve swelling of the face/tongue/throat, SOB, or low BP? Yes Did it involve sudden or severe rash/hives, skin peeling, or any reaction on the inside of your mouth or nose? Yes Did you need to seek medical attention at a hospital or doctor's office? Yes When did it last happen? If all above answers are NO, may proceed with cephalosporin use.    Ciprofloxacin Hives     Physical Exam:   Vitals  Blood pressure 116/67, pulse (!) 59, temperature 98.1 F (36.7 C), temperature source Oral, resp. rate 14, height 6' (1.829 m), weight 106.6 kg, SpO2 96 %.  1.  General: Patient lying supine in bed on cell phone in no acute distress  2. Psychiatric: Cooperative, alert and oriented x3  3. Neurologic: Cranial nerves II through XII are grossly intact.  No focal deficits on  limited exam.  4. HEENMT:  Head is atraumatic normocephalic, pupils are reactive to light, mucous membranes are moist, trachea is midline  5. Respiratory : Lungs are clear to auscultation bilaterally  6. Cardiovascular : H RRR  7. Gastrointestinal:  Abdomen is soft nondistended nontender to palpation  8. Skin:  No new rashes or lesions on limited skin exam  9.Musculoskeletal:  Left foot in walking boot with malodorous bandage and wound VAC in place.    Data Review:    CBC Recent Labs  Lab 08/12/19 0006  WBC 7.2  HGB 11.5*  HCT 38.9*  PLT 237  MCV 80.0  MCH 23.7*  MCHC 29.6*  RDW 17.7*  LYMPHSABS 3.0  MONOABS 0.7  EOSABS 0.1  BASOSABS 0.0   ------------------------------------------------------------------------------------------------------------------  Results for orders placed or performed during the hospital encounter of 08/11/19 (from the past 48 hour(s))  CBC with Differential/Platelet     Status: Abnormal   Collection Time: 08/12/19 12:06 AM  Result Value Ref Range   WBC 7.2 4.0 - 10.5 K/uL   RBC 4.86 4.22 - 5.81 MIL/uL   Hemoglobin 11.5 (L) 13.0 - 17.0 g/dL   HCT 96.038.9 (L) 45.439.0 - 09.852.0 %   MCV 80.0 80.0 - 100.0 fL   MCH 23.7 (L) 26.0 - 34.0 pg   MCHC 29.6 (L) 30.0 - 36.0 g/dL   RDW 11.917.7 (H) 14.711.5 - 82.915.5 %   Platelets 237 150 - 400 K/uL   nRBC 0.0 0.0 - 0.2 %   Neutrophils Relative % 45 %   Neutro Abs 3.3 1.7 - 7.7 K/uL   Lymphocytes Relative 42 %   Lymphs Abs 3.0 0.7 - 4.0 K/uL   Monocytes Relative 10 %  Monocytes Absolute 0.7 0.1 - 1.0 K/uL   Eosinophils Relative 2 %   Eosinophils Absolute 0.1 0.0 - 0.5 K/uL   Basophils Relative 1 %   Basophils Absolute 0.0 0.0 - 0.1 K/uL   Immature Granulocytes 0 %   Abs Immature Granulocytes 0.03 0.00 - 0.07 K/uL    Comment: Performed at [facility], [address] [place], [place] [address]  Comprehensive metabolic panel     Status: Abnormal   Collection Time: 08/12/19 12:06 AM  Result Value Ref Range     Sodium 140 135 - 145 mmol/L   Potassium 3.8 3.5 - 5.1 mmol/L   Chloride 110 98 - 111 mmol/L   CO2 23 22 - 32 mmol/L   Glucose, Bld 118 (H) 70 - 99 mg/dL   BUN 20 6 - 20 mg/dL   Creatinine, Ser 8.58 (H) 0.61 - 1.24 mg/dL   Calcium 9.1 8.9 - 85.0 mg/dL   Total Protein 7.3 6.5 - 8.1 g/dL   Albumin 4.5 3.5 - 5.0 g/dL   AST 29 15 - 41 U/L   ALT 51 (H) 0 - 44 U/L   Alkaline Phosphatase 130 (H) 38 - 126 U/L   Total Bilirubin 0.2 (L) 0.3 - 1.2 mg/dL   GFR calc non Af Amer >60 >60 mL/min   GFR calc Af Amer >60 >60 mL/min   Anion gap 7 5 - 15    Comment: Performed at [facility], [address], [place], [place] [address]  Lipase, blood     Status: None   Collection Time: 08/12/19 12:06 AM  Result Value Ref Range   Lipase 37 11 - 51 U/L    Comment: Performed at [facility], [address] [place], [place] [address]  Troponin I (High Sensitivity)     Status: None   Collection Time: 08/12/19 12:12 AM  Result Value Ref Range   Troponin I (High Sensitivity) 2 <18 ng/L    Comment: (NOTE) Elevated high sensitivity troponin I (hsTnI) values and significant  changes across serial measurements may suggest ACS but many other  chronic and acute conditions are known to elevate hsTnI results.  Refer to the "Links" section for chest pain algorithms and additional  guidance. Performed at [facility], [address], [place], [place] [address]   POC occult blood, ED Provider will collect     Status: None   Collection Time: 08/12/19 12:25 AM  Result Value Ref Range   Fecal Occult Bld NEGATIVE NEGATIVE  Urinalysis, Routine w reflex microscopic     Status: Abnormal   Collection Time: 08/12/19 12:50 AM  Result Value Ref Range   Color, Urine YELLOW YELLOW   APPearance CLOUDY (A) CLEAR   Specific Gravity, Urine 1.018 1.005 - 1.030   pH 7.0 5.0 - 8.0   Glucose, UA >=500 (A) NEGATIVE mg/dL   Hgb urine dipstick NEGATIVE NEGATIVE   Bilirubin Urine NEGATIVE NEGATIVE   Ketones, ur NEGATIVE NEGATIVE  mg/dL   Protein, ur NEGATIVE NEGATIVE mg/dL   Nitrite NEGATIVE NEGATIVE   Leukocytes,Ua NEGATIVE NEGATIVE   RBC / HPF 0-5 0 - 5 RBC/hpf   WBC, UA 0-5 0 - 5 WBC/hpf   Bacteria, UA NONE SEEN NONE SEEN   Budding Yeast PRESENT     Comment: Performed at [facility], [address], [place], [place] [address]  Rapid urine drug screen (hospital performed)     Status: None   Collection Time: 08/12/19 12:50 AM  Result Value Ref Range  Opiates NONE DETECTED NONE DETECTED   Cocaine NONE DETECTED NONE DETECTED   Benzodiazepines NONE DETECTED NONE DETECTED   Amphetamines NONE DETECTED NONE DETECTED   Tetrahydrocannabinol NONE DETECTED NONE DETECTED   Barbiturates NONE DETECTED NONE DETECTED    Comment: (NOTE) DRUG SCREEN FOR MEDICAL PURPOSES ONLY.  IF CONFIRMATION IS NEEDED FOR ANY PURPOSE, NOTIFY LAB WITHIN 5 DAYS. LOWEST DETECTABLE LIMITS FOR URINE DRUG SCREEN Drug Class                     Cutoff (ng/mL) Amphetamine and metabolites    1000 Barbiturate and metabolites    200 Benzodiazepine                 200 Tricyclics and metabolites     300 Opiates and metabolites        300 Cocaine and metabolites        300 THC                            50 Performed at [facility], [address] [place], [place] [address]   Troponin I (High Sensitivity)     Status: None   Collection Time: 08/12/19  2:14 AM  Result Value Ref Range   Troponin I (High Sensitivity) 2 <18 ng/L    Comment: (NOTE) Elevated high sensitivity troponin I (hsTnI) values and significant  changes across serial measurements may suggest ACS but many other  chronic and acute conditions are known to elevate hsTnI results.  Refer to the "Links" section for chest pain algorithms and additional  guidance. Performed at [facility], [address], [place], [place] [address]     Chemistries  Recent Labs  Lab 08/12/19 0006  NA 140  K 3.8  CL 110  CO2 23  GLUCOSE 118*  BUN 20  CREATININE 1.28*  CALCIUM 9.1  AST 29    ALT 51*  ALKPHOS 130*  BILITOT 0.2*   ------------------------------------------------------------------------------------------------------------------  ------------------------------------------------------------------------------------------------------------------ GFR: Estimated Creatinine Clearance: 105.5 mL/min (A) (by C-G formula based on SCr of 1.28 mg/dL (H)). Liver Function Tests: Recent Labs  Lab 08/12/19 0006  AST 29  ALT 51*  ALKPHOS 130*  BILITOT 0.2*  PROT 7.3  ALBUMIN 4.5   Recent Labs  Lab 08/12/19 0006  LIPASE 37   No results for input(s): AMMONIA in the last 168 hours. Coagulation Profile: No results for input(s): INR, PROTIME in the last 168 hours. Cardiac Enzymes: No results for input(s): CKTOTAL, CKMB, CKMBINDEX, TROPONINI in the last 168 hours. BNP (last 3 results) No results for input(s): PROBNP in the last 8760 hours. HbA1C: No results for input(s): HGBA1C in the last 72 hours. CBG: No results for input(s): GLUCAP in the last 168 hours. Lipid Profile: No results for input(s): CHOL, HDL, LDLCALC, TRIG, CHOLHDL, LDLDIRECT in the last 72 hours. Thyroid Function Tests: No results for input(s): TSH, T4TOTAL, FREET4, T3FREE, THYROIDAB in the last 72 hours. Anemia Panel: No results for input(s): VITAMINB12, FOLATE, FERRITIN, TIBC, IRON, RETICCTPCT in the last 72 hours.  --------------------------------------------------------------------------------------------------------------- Urine analysis:    Component Value Date/Time   COLORURINE YELLOW 08/12/2019 0050   APPEARANCEUR CLOUDY (A) 08/12/2019 0050   LABSPEC 1.018 08/12/2019 0050   PHURINE 7.0 08/12/2019 0050   GLUCOSEU >=500 (A) 08/12/2019 0050   HGBUR NEGATIVE 08/12/2019 0050   BILIRUBINUR NEGATIVE 08/12/2019 0050   KETONESUR NEGATIVE 08/12/2019 0050   PROTEINUR NEGATIVE 08/12/2019 0050   NITRITE NEGATIVE 08/12/2019  0050   LEUKOCYTESUR NEGATIVE 08/12/2019 0050      Imaging Results:     Ct Abdomen Pelvis W Contrast  Result Date: 08/12/2019 CLINICAL DATA:  Pancreatitis suspected, atypical presentation/labs Abdominal and flank pain. Back pain. Headache. Nausea and diarrhea. Blood in stool. EXAM: CT ABDOMEN AND PELVIS WITH CONTRAST TECHNIQUE: Multidetector CT imaging of the abdomen and pelvis was performed using the standard protocol following bolus administration of intravenous contrast. CONTRAST:  51mL OMNIPAQUE IOHEXOL 300 MG/ML  SOLN COMPARISON:  Multiple prior exams most recently 05/15/2019. FINDINGS: Lower chest: Linear atelectasis in the right lower lobe. Additional hypoventilatory changes dependently. No pleural fluid. Hepatobiliary: Decreased hepatic density consistent with steatosis. No focal abnormality. Clips in the gallbladder fossa postcholecystectomy. No biliary dilatation. Pancreas: Calcifications in the pancreatic head and uncinate process again seen. No ductal dilatation or inflammation. Spleen: Normal in size without focal abnormality. Adrenals/Urinary Tract: Normal adrenal glands. No perinephric edema of either kidney. Multiple nonobstructing stones in the right kidney. Mild right ureteral prominence without frank hydronephrosis. No right ureteral stone. Punctate nonobstructing stones in the lower left kidney. Mild left pelvicaliectasis and ureteral prominence without ureteral stone. Urinary bladder is physiologically distended. No bladder stone or wall thickening. Stomach/Bowel: Stomach is distended with ingested contents. Appendix appears normal. No evidence of bowel wall thickening, distention, or inflammatory changes. Vascular/Lymphatic: Multiple small retroperitoneal and central mesenteric nodes, not enlarged by size criteria. Portal vein is patent. Splenic vein is patent. Normal caliber abdominal aorta. No acute vascular findings. Reproductive: Prostate is unremarkable. Other: No free air, free fluid, or intra-abdominal fluid collection. Small fat containing umbilical  hernia. Musculoskeletal: Interval callus formation about the left iliac bone since prior exam. Scoliotic curvature of spine. There are no acute or suspicious osseous abnormalities. IMPRESSION: 1. Pancreatic head calcifications consistent with chronic pancreatitis. No acute pancreatic inflammation. 2. Bilateral nonobstructing renal calculi. Bilateral ureteral prominence without hydronephrosis or obstructing stone. 3. Hepatic steatosis. Electronically Signed   By: Keith Rake M.D.   On: 08/12/2019 01:53    My personal review of EKG: Rhythm NSR, Rate 58 /min, QTc 424,no Acute ST changes   Assessment & Plan:    Active Problems:   Acute recurrent pancreatitis   1. Recurrent acute on chronic pancreatitis 1. Patient reports that he has had about 30 spells in the last 2 years 2. We will keep him n.p.o. with fluid hydration 3. Pain control with morphine 4. Will need outpatient follow-up with a GI physician 5. Ranson score of 0 without knowing LDH 6. Control nausea with Zofran 2. Anemia 1. Occult blood negative 2. Continue to monitor with daily CBC 3. GI source of blood loss not completely ruled out as patient did report one episode of bloody diarrhea today. 3. Elevated creatinine 1. Baseline is 1.0 today is 1.  2 8 2. Continue fluid hydration 3. Continue to monitor with daily CMP 4. Glycosuria 1. Extensive counseling on the importance of glycemic control 2. Basal and sliding scale insulin ordered 3. Hold oral hypoglycemics during hospitalization 5. History of osteomyelitis 1. Wound VAC on left foot in place 2. Patient reports that he had a surgery to remove the necrotic bone, and it was replaced with a bone graft from his pelvis. 3. Consider wound or general surgery consult for management of the wound VAC 6. Diabetes mellitus 1. See plan for #4 7.    DVT Prophylaxis-   Heparin and SCDs  AM Labs Ordered, also please review Full Orders  Family Communication: Admission, patients  condition and plan of care including tests being ordered have been discussed with the patient who indicates understanding and agrees with the plan.    Code Status: CODE STATUS full  Admission status: Observation: Based on patients clinical presentation and evaluation of above clinical data, I have made determination that patient meets observation criteria at this time.  Time spent in minutes : 60   Lilyan GilfordAsia B Zierle-Ghosh M.D on 08/12/2019 at 3:39 AM

## 2019-08-12 NOTE — ED Provider Notes (Signed)
[facility] EMERGENCY DEPARTMENT Provider Note   CSN: [id number] Arrival date & time: 08/11/19  2256     History   Chief Complaint Chief Complaint  Patient presents with   Abdominal Pain    HPI Dennis Yang is a 33 y.o. male.     Patient with history of diabetes, bipolar disorder, pancreatitis, foot infection with current wound VAC in place presenting with sudden onset of abdominal pain, nausea, diarrhea with bloody stools as well as headache.  Patient states everything started around 8 PM when he had sudden onset of right upper quadrant epigastric pain similar to previous episodes of pancreatitis.  He has had nausea but no vomiting.  He had multiple loose green stools some of which were mixed with blood.  Had gradual onset diffuse headache as well associated with chills and nausea but no vomiting.  No fever.  No pain with urination or blood in the urine.  Symptoms are similar to when he was hospitalized in [place] last week for pancreatitis.  States the cause of his pancreatitis is unknown.  Has had a previous cholecystectomy.  Has abused alcohol in the past but none for 2 years.  States his foot wound is doing well since his surgery in May and he has a wound VAC in place.  Not currently on antibiotics. States he felt well when he left the hospital on a regular diet with symptoms recurred again tonight around 8 PM and have been fairly constant.  The history is provided by the patient.    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 Authorizing Provider  ARIPiprazole (ABILIFY) 10 MG tablet Take 15 mg by mouth daily.  01/22/19   [provider]  ATROVENT HFA 17 MCG/ACT inhaler Inhale 1 puff into the lungs every 4 (four) hours as needed for wheezing.  01/16/19   [provider]  CARAFATE 1 g tablet Take 1 g by mouth 2 (two) times daily.  12/20/18   [provider]  celecoxib (CELEBREX) 100 MG capsule Take 100 mg by mouth 2 (two) times daily.  01/12/19   [provider]  Cholecalciferol (VITAMIN D3) 1.25 MG (50000 UT) CAPS Take 1 capsule by mouth every Monday, Wednesday, and Friday. 3 times a week 12/24/18   [provider]  docusate sodium (COLACE) 100 MG capsule Take 100 mg by mouth daily.  01/01/19   [provider]  EMGALITY 120 MG/ML SOAJ  Inject 1 Dose into the muscle every 30 (thirty) days.  11/24/18   [provider]  escitalopram (LEXAPRO) 20 MG tablet Take 20 mg by mouth daily.  01/22/19   [provider]  FARXIGA 10 MG TABS tablet Take 10 mg by mouth daily.  01/07/19   [provider]  fluticasone (FLONASE) 50 MCG/ACT nasal spray Place 1 spray into both nostrils daily.  01/20/19   [provider]  glipiZIDE (GLUCOTROL) 10 MG tablet Take 10 mg by mouth 2 (two) times daily. 01/19/19   [provider]  hydrOXYzine (ATARAX/VISTARIL) 50 MG tablet Take 50 mg by mouth 4 (four) times daily. 01/22/19   [provider]  Insulin Detemir (LEVEMIR FLEXTOUCH) 100 UNIT/ML Pen Inject 10-30 Units into the skin See admin instructions. 30 units in the  morning and 10 units at bedtime 03/31/19   [provider]  levocetirizine (XYZAL) 5 MG tablet Take 5 mg by mouth every evening.  01/15/19   [provider]  montelukast (SINGULAIR) 10 MG tablet Take 10 mg by mouth daily.  01/12/19   [provider]  NOVOLOG FLEXPEN 100 UNIT/ML FlexPen Inject 3 Units into the skin 3 (three) times daily with meals. 04/27/19   [provider]  omeprazole (PRILOSEC) 40 MG capsule Take 1 capsule (40 mg total) by mouth daily. 01/28/19   Roxan Hockey, MD  oxybutynin (DITROPAN) 5 MG tablet Take 1 mg by mouth 3 (three) times daily. 03/31/19   [provider]  prazosin (MINIPRESS) 5 MG capsule Take 5 mg by mouth at bedtime.  01/22/19   [provider]  pregabalin (LYRICA) 150 MG capsule Take 150 mg by mouth 2 (two) times daily. 06/08/19   [provider]  propranolol (INDERAL) 40 MG tablet Take 40 mg by mouth 2 (two) times daily.  01/12/19   [provider]  simvastatin (ZOCOR) 20 MG tablet Take 20 mg by mouth daily at 6 PM.  12/25/18   [provider]  topiramate (TOPAMAX) 100 MG tablet Take 100 mg by mouth 2 (two) times daily. 05/18/19   [provider]  XIIDRA 5 % SOLN Place 2 drops into both eyes daily.  01/21/19   [provider]    Family History History reviewed. No pertinent family history.  Social History Social History   Tobacco Use   Smoking status: Former Smoker   Smokeless tobacco: Never Used  Substance Use Topics   Alcohol use: Not Currently   Drug use: Not Currently     Allergies   Penicillins and Ciprofloxacin   Review of Systems Review of Systems  Constitutional: Positive for activity change and appetite change. Negative for fatigue and fever.  HENT: Negative for congestion, mouth sores, nosebleeds and rhinorrhea.   Eyes: Negative for visual disturbance.  Respiratory: Positive for chest tightness. Negative for cough and shortness of breath.     Cardiovascular: Negative for chest pain.  Gastrointestinal: Positive for abdominal pain, blood in stool, diarrhea and nausea. Negative for vomiting.  Genitourinary: Negative for dysuria and hematuria.  Musculoskeletal: Positive for arthralgias, back pain and myalgias.  Skin: Negative for rash.  Neurological: Positive for headaches. Negative for dizziness and weakness.    all other systems are negative except as noted in the HPI and PMH.    Physical Exam Updated Vital Signs BP 123/85 (BP Location: Right Arm)    Pulse 79    Temp 98.1 F (36.7 C) (Oral)    Resp 18    Ht  6' (1.829 m)    Wt 106.6 kg    SpO2 100%    BMI 31.87 kg/m   Physical Exam Vitals signs and nursing note reviewed.  Constitutional:      General: He is not in acute distress.    Appearance: He is well-developed. He is obese.  HENT:     Head: Normocephalic and atraumatic.     Mouth/Throat:     Pharynx: No oropharyngeal exudate.  Eyes:     Conjunctiva/sclera: Conjunctivae normal.     Pupils: Pupils are equal, round, and reactive to light.  Neck:     Musculoskeletal: Normal range of motion and neck supple.     Comments: No meningismus. Cardiovascular:     Rate and Rhythm: Normal rate and regular rhythm.     Heart sounds: Normal heart sounds. No murmur.  Pulmonary:     Effort: Pulmonary effort is normal. No respiratory distress.     Breath sounds: Normal breath sounds.  Chest:     Chest wall: No tenderness.  Abdominal:     Palpations: Abdomen is soft.     Tenderness: There is abdominal tenderness. There is no guarding or rebound.     Comments: Epigastric and right upper quadrant tenderness, no rebound or guarding  Genitourinary:    Comments: Chaperone present.  No external hemorrhoids or fissures.  No gross blood. Musculoskeletal: Normal range of motion.        General: No tenderness.     Comments: Wound VAC in place to left foot  Skin:    General: Skin is warm.     Capillary Refill: Capillary refill takes  less than 2 seconds.  Neurological:     General: No focal deficit present.     Mental Status: He is alert and oriented to person, place, and time. Mental status is at baseline.     Cranial Nerves: No cranial nerve deficit.     Motor: No abnormal muscle tone.     Coordination: Coordination normal.     Comments: No ataxia on finger to nose bilaterally. No pronator drift. 5/5 strength throughout. CN 2-12 intact.Equal grip strength. Sensation intact.   Psychiatric:        Behavior: Behavior normal.      ED Treatments / Results  Labs (all labs ordered are listed, but only abnormal results are displayed) Labs Reviewed  URINALYSIS, ROUTINE W REFLEX MICROSCOPIC - Abnormal; Notable for the following components:      Result Value   APPearance CLOUDY (*)    Glucose, UA >=500 (*)    All other components within normal limits  CBC WITH DIFFERENTIAL/PLATELET - Abnormal; Notable for the following components:   Hemoglobin 11.5 (*)    HCT 38.9 (*)    MCH 23.7 (*)    MCHC 29.6 (*)    RDW 17.7 (*)    All other components within normal limits  COMPREHENSIVE METABOLIC PANEL - Abnormal; Notable for the following components:   Glucose, Bld 118 (*)    Creatinine, Ser 1.28 (*)    ALT 51 (*)    Alkaline Phosphatase 130 (*)    Total Bilirubin 0.2 (*)    All other components within normal limits  SARS CORONAVIRUS 2 (TAT 6-24 HRS)  RAPID URINE DRUG SCREEN, HOSP PERFORMED  LIPASE, BLOOD  LACTATE DEHYDROGENASE  HEMOGLOBIN A1C  POC OCCULT BLOOD, ED  TROPONIN I (HIGH SENSITIVITY)  TROPONIN I (HIGH SENSITIVITY)    EKG EKG Interpretation  Date/Time:  Wednesday August 12 2019 00:51:48  EDT Ventricular Rate:  58 PR Interval:    QRS Duration: 93 QT Interval:  431 QTC Calculation: 424 R Axis:   68 Text Interpretation:  Sinus rhythm Borderline short PR interval No previous ECGs available Confirmed by Ezequiel Essex [phone]) on 08/12/2019 1:21:52 AM   Radiology Ct Abdomen Pelvis W  Contrast  Result Date: 08/12/2019 CLINICAL DATA:  Pancreatitis suspected, atypical presentation/labs Abdominal and flank pain. Back pain. Headache. Nausea and diarrhea. Blood in stool. EXAM: CT ABDOMEN AND PELVIS WITH CONTRAST TECHNIQUE: Multidetector CT imaging of the abdomen and pelvis was performed using the standard protocol following bolus administration of intravenous contrast. CONTRAST:  85m OMNIPAQUE IOHEXOL 300 MG/ML  SOLN COMPARISON:  Multiple prior exams most recently 05/15/2019. FINDINGS: Lower chest: Linear atelectasis in the right lower lobe. Additional hypoventilatory changes dependently. No pleural fluid. Hepatobiliary: Decreased hepatic density consistent with steatosis. No focal abnormality. Clips in the gallbladder fossa postcholecystectomy. No biliary dilatation. Pancreas: Calcifications in the pancreatic head and uncinate process again seen. No ductal dilatation or inflammation. Spleen: Normal in size without focal abnormality. Adrenals/Urinary Tract: Normal adrenal glands. No perinephric edema of either kidney. Multiple nonobstructing stones in the right kidney. Mild right ureteral prominence without frank hydronephrosis. No right ureteral stone. Punctate nonobstructing stones in the lower left kidney. Mild left pelvicaliectasis and ureteral prominence without ureteral stone. Urinary bladder is physiologically distended. No bladder stone or wall thickening. Stomach/Bowel: Stomach is distended with ingested contents. Appendix appears normal. No evidence of bowel wall thickening, distention, or inflammatory changes. Vascular/Lymphatic: Multiple small retroperitoneal and central mesenteric nodes, not enlarged by size criteria. Portal vein is patent. Splenic vein is patent. Normal caliber abdominal aorta. No acute vascular findings. Reproductive: Prostate is unremarkable. Other: No free air, free fluid, or intra-abdominal fluid collection. Small fat containing umbilical hernia. Musculoskeletal:  Interval callus formation about the left iliac bone since prior exam. Scoliotic curvature of spine. There are no acute or suspicious osseous abnormalities. IMPRESSION: 1. Pancreatic head calcifications consistent with chronic pancreatitis. No acute pancreatic inflammation. 2. Bilateral nonobstructing renal calculi. Bilateral ureteral prominence without hydronephrosis or obstructing stone. 3. Hepatic steatosis. Electronically Signed   By: MKeith RakeM.D.   On: 08/12/2019 01:53    Procedures Procedures (including critical care time)  Medications Ordered in ED Medications - No data to display   Initial Impression / Assessment and Plan / ED Course  I have reviewed the triage vital signs and the nursing notes.  Pertinent labs & imaging results that were available during my care of the patient were reviewed by me and considered in my medical decision making (see chart for details).       Diabetic with recurrent abdominal pain consistent with previous episodes of pancreatitis.  Nausea but no vomiting.  Abdomen is soft without peritoneal signs.  We will hydrate, treat symptomatically, obtain labs.  Obtain outside records.  Blood sugar 118.  Normal anion gap.  No evidence of DKA.  Lipase is 37.  LFTs appear to be at baseline.  [facility] [place]  Patient was admitted 8 24-8 26.  He was admitted with pancreatitis acute on chronic.  CT on 823 showed hepatic steatosis, bilateral nonobstructive nephrolithiasis, chronic pancreatitis.  Right upper quadrant ultrasound showed fatty liver with no gallstones.  AST was 42-95.  ALT was 74-1 01.  Alk phos was 1 47-1 83.  Patient with persistent nausea but no vomiting.  States he is still having a lot of pain.  CT  scan today shows evidence of chronic pancreatitis without acute inflammation.  LFT elevation similar to previous.  His lipase was normal.  His hemoglobin is slightly decreased from previous  values but Hemoccult is negative.  Did report one episode of bloody diarrhea today.    Patient reports too much pain and nausea.  He is able to tolerate ice chips however. Reports he is not able to go home because of his persistent pain and nausea and is requesting admission.  Observation discussed with Dr. Clearence Ped.   Final Clinical Impressions(s) / ED Diagnoses   Final diagnoses:  Acute on chronic pancreatitis [facility])    ED Discharge Orders    None       Ezequiel Essex, MD 08/12/19 [phone]

## 2019-08-12 NOTE — Progress Notes (Signed)
Patient has a boot on his left foot. The foot is also wrapped in a bandage and has a would vac dressing attached. He refused to let me unwrap it to assess. He stated that he has a wound care nurse that he sees. I notified Dr. Joesph Fillers about the matter.

## 2019-08-13 DIAGNOSIS — K859 Acute pancreatitis without necrosis or infection, unspecified: Secondary | ICD-10-CM

## 2019-08-13 LAB — GLUCOSE, CAPILLARY
Glucose-Capillary: 106 mg/dL — ABNORMAL HIGH (ref 70–99)
Glucose-Capillary: 106 mg/dL — ABNORMAL HIGH (ref 70–99)
Glucose-Capillary: 94 mg/dL (ref 70–99)
Glucose-Capillary: 155 mg/dL — ABNORMAL HIGH (ref 70–99)

## 2019-08-13 MED ORDER — ACETAMINOPHEN 325 MG PO TABS: 650.0000 mg | ORAL_TABLET | Freq: Four times a day (QID) | ORAL | 0 refills | Status: AC | PRN

## 2019-08-13 MED ORDER — CELECOXIB 100 MG PO CAPS: 100.0000 mg | ORAL_CAPSULE | Freq: Every day | ORAL | 0 refills | Status: DC

## 2019-08-13 MED ORDER — OMEPRAZOLE 40 MG PO CPDR: 40.0000 mg | DELAYED_RELEASE_CAPSULE | Freq: Every day | ORAL | 3 refills | Status: DC

## 2019-08-13 MED ORDER — OXYCODONE HCL 5 MG PO TABS
5.0000 mg | ORAL_TABLET | ORAL | Status: DC | PRN
  Administered 2019-08-13: 13:00:00 5 mg via ORAL
  Filled 2019-08-13: qty 1

## 2019-08-13 MED ORDER — SODIUM CHLORIDE 0.9 % IV BOLUS
1000.0000 mL | Freq: Once | INTRAVENOUS | Status: AC
  Administered 2019-08-13: 07:00:00 1000 mL via INTRAVENOUS

## 2019-08-13 MED ORDER — PRAZOSIN HCL 2 MG PO CAPS: 2.0000 mg | ORAL_CAPSULE | Freq: Every day | ORAL | 0 refills | Status: DC

## 2019-08-13 NOTE — Progress Notes (Signed)
Patients blood pressure is 97/59. Paged MD. MD placed order for one time normal saline fluid bolus refer to mar for dose. Will continue to monitor throughout shift.

## 2019-08-13 NOTE — Plan of Care (Signed)
  Problem: Education: Goal: Knowledge of General Education information will improve Description: Including pain rating scale, medication(s)/side effects and non-pharmacologic comfort measures 08/13/2019 1202 by Santa Lighter, RN Outcome: Adequate for Discharge 08/13/2019 1201 by Santa Lighter, RN Outcome: Progressing   Problem: Clinical Measurements: Goal: Ability to maintain clinical measurements within normal limits will improve Outcome: Adequate for Discharge Goal: Will remain free from infection Outcome: Adequate for Discharge Goal: Diagnostic test results will improve Outcome: Adequate for Discharge Goal: Respiratory complications will improve Outcome: Adequate for Discharge Goal: Cardiovascular complication will be avoided Outcome: Adequate for Discharge   Problem: Activity: Goal: Risk for activity intolerance will decrease Outcome: Adequate for Discharge   Problem: Nutrition: Goal: Adequate nutrition will be maintained Outcome: Adequate for Discharge   Problem: Coping: Goal: Level of anxiety will decrease Outcome: Adequate for Discharge   Problem: Elimination: Goal: Will not experience complications related to bowel motility Outcome: Adequate for Discharge Goal: Will not experience complications related to urinary retention Outcome: Adequate for Discharge   Problem: Pain Managment: Goal: General experience of comfort will improve Outcome: Adequate for Discharge   Problem: Safety: Goal: Ability to remain free from injury will improve Outcome: Adequate for Discharge   Problem: Skin Integrity: Goal: Risk for impaired skin integrity will decrease Outcome: Adequate for Discharge   Problem: Education: Goal: Knowledge of Pancreatitis treatment and prevention will improve Outcome: Adequate for Discharge   Problem: Health Behavior/Discharge Planning: Goal: Ability to formulate a plan to maintain an alcohol-free life will improve Outcome: Adequate for Discharge    Problem: Nutritional: Goal: Ability to achieve adequate nutritional intake will improve Outcome: Adequate for Discharge   Problem: Clinical Measurements: Goal: Complications related to the disease process, condition or treatment will be avoided or minimized Outcome: Adequate for Discharge

## 2019-08-13 NOTE — Progress Notes (Signed)
Nsg Discharge Note  Admit Date:  08/11/2019 Discharge date: 08/13/2019   Dennis Yang to be D/C'd Home per MD order.  AVS completed.  Copy for chart, and copy for patient signed, and dated. Patient/caregiver able to verbalize understanding. Removed IV-clean, dry, intact. Reviewed d/c paperwork with patient. Answered all questions. Wheeled stable patient and belongings to main entrance where he was picked up by his father. Discharge Medication: Allergies as of 08/13/2019      Reactions   Penicillins Anaphylaxis   Did it involve swelling of the face/tongue/throat, SOB, or low BP? Yes Did it involve sudden or severe rash/hives, skin peeling, or any reaction on the inside of your mouth or nose? Yes Did you need to seek medical attention at a hospital or doctor's office? Yes When did it last happen? If all above answers are "NO", may proceed with cephalosporin use.   Ciprofloxacin Hives      Medication List    TAKE these medications   acetaminophen 325 MG tablet Commonly known as: TYLENOL Take 2 tablets (650 mg total) by mouth every 6 (six) hours as needed for mild pain, fever or headache (or Fever >/= 101).   ARIPiprazole 15 MG tablet Commonly known as: ABILIFY Take 15 mg by mouth daily.   Atrovent HFA 17 MCG/ACT inhaler Generic drug: ipratropium Inhale 1 puff into the lungs every 4 (four) hours as needed for wheezing.   Carafate 1 g tablet Generic drug: sucralfate Take 1 g by mouth 2 (two) times daily.   celecoxib 100 MG capsule Commonly known as: CELEBREX Take 1 capsule (100 mg total) by mouth daily with breakfast. What changed: when to take this   docusate sodium 100 MG capsule Commonly known as: COLACE Take 100 mg by mouth daily.   Emgality 120 MG/ML Soaj Generic drug: Galcanezumab-gnlm Inject 1 Dose into the muscle every 30 (thirty) days.   escitalopram 20 MG tablet Commonly known as: LEXAPRO Take 20 mg by mouth daily.   Farxiga 10 MG Tabs tablet Generic drug:  dapagliflozin propanediol Take 10 mg by mouth daily.   fluticasone 50 MCG/ACT nasal spray Commonly known as: FLONASE Place 1 spray into both nostrils daily.   glipiZIDE 10 MG tablet Commonly known as: GLUCOTROL Take 10 mg by mouth 2 (two) times daily.   hydrOXYzine 50 MG tablet Commonly known as: ATARAX/VISTARIL Take 50 mg by mouth 4 (four) times daily.   Levemir FlexTouch 100 UNIT/ML Pen Generic drug: Insulin Detemir Inject 10-30 Units into the skin See admin instructions. 30 units in the morning and 30 units at bedtime   levocetirizine 5 MG tablet Commonly known as: XYZAL Take 5 mg by mouth every evening.   montelukast 10 MG tablet Commonly known as: SINGULAIR Take 10 mg by mouth daily.   NovoLOG FlexPen 100 UNIT/ML FlexPen Generic drug: insulin aspart Inject 15 Units into the skin 3 (three) times daily with meals.   omeprazole 40 MG capsule Commonly known as: PRILOSEC Take 1 capsule (40 mg total) by mouth daily.   oxybutynin 5 MG tablet Commonly known as: DITROPAN Take 1 mg by mouth 3 (three) times daily.   prazosin 2 MG capsule Commonly known as: MINIPRESS Take 1 capsule (2 mg total) by mouth at bedtime. What changed:   medication strength  how much to take   pregabalin 200 MG capsule Commonly known as: LYRICA Take 200 mg by mouth 2 (two) times daily.   propranolol 40 MG tablet Commonly known as: INDERAL Take 40 mg by  mouth 2 (two) times daily.   simvastatin 20 MG tablet Commonly known as: ZOCOR Take 20 mg by mouth daily at 6 PM.   topiramate 100 MG tablet Commonly known as: TOPAMAX Take 100 mg by mouth 2 (two) times daily.   Vitamin D3 1.25 MG (50000 UT) Caps Take 1 capsule by mouth every Monday, Wednesday, and Friday. 3 times a week   Xiidra 5 % Soln Generic drug: Lifitegrast Place 2 drops into both eyes daily.       Discharge Assessment: Vitals:   08/13/19 0849 08/13/19 0850  BP: 128/70   Pulse:  72  Resp: 20   Temp:    SpO2:  100%    Skin clean, dry and intact without evidence of skin break down, no evidence of skin tears noted. IV catheter discontinued intact. Site without signs and symptoms of complications - no redness or edema noted at insertion site, patient denies c/o pain - only slight tenderness at site.  Dressing with slight pressure applied.  D/c Instructions-Education: Discharge instructions given to patient/family with verbalized understanding. D/c education completed with patient/family including follow up instructions, medication list, d/c activities limitations if indicated, with other d/c instructions as indicated by MD - patient able to verbalize understanding, all questions fully answered. Patient instructed to return to ED, call 911, or call MD for any changes in condition.  Patient escorted via WC, and D/C home via private auto.  Karolee OhsShannon  Armentha Branagan, RN 08/13/2019 4:35 PM

## 2019-08-13 NOTE — Discharge Instructions (Signed)
1) follow-up with your podiatrist Dr. Sherran Needs as previously scheduled for wound check and possible wound VAC  2) follow-up with the gastroenterologist in [facility] for ongoing management of your chronic pancreatitis--- you may need Creon tablets if you get persistent diarrhea 3) follow-up with  primary care doctor Dr. Lynnette Caffey for recheck and reevaluation within 1 week 4)  outpatient psychiatric follow-up for adjustment of your psychiatric medications advised

## 2019-08-13 NOTE — Plan of Care (Signed)
  Problem: Education: Goal: Knowledge of General Education information will improve Description Including pain rating scale, medication(s)/side effects and non-pharmacologic comfort measures Outcome: Progressing   Problem: Health Behavior/Discharge Planning: Goal: Ability to manage health-related needs will improve Outcome: Progressing   

## 2019-08-13 NOTE — Discharge Summary (Signed)
Dennis Yang, is a 33 y.o. male  DOB [birth year]  MRN [id number].  Admission date:  08/11/2019  Admitting Physician  Lilyan Gilford, DO  Discharge Date:  08/13/2019   Primary MD  Becky Sax, DO  Recommendations for primary care physician for things to follow:   1) follow-up with your podiatrist Dr. Coralee Pesa as previously scheduled for wound check and possible wound VAC  2) follow-up with the gastroenterologist in [facility] for ongoing management of your chronic pancreatitis--- you may need Creon tablets if you get persistent diarrhea 3) follow-up with  primary care doctor Dr. Langston Masker for recheck and reevaluation within 1 week 4)  outpatient psychiatric follow-up for adjustment of your psychiatric medications advised   Admission Diagnosis  Acute on chronic pancreatitis (HCC) [K85.90, K86.1]   Discharge Diagnosis  Acute on chronic pancreatitis (HCC) [K85.90, K86.1]    Principal Problem:   Acute on chronic/recurrent pancreatitis Active Problems:   Bipolar 1 disorder (HCC)   Depression with anxiety   Type 2 diabetes mellitus without complication (HCC)      Past Medical History:  Diagnosis Date  . Anxiety   . Back pain   . Bipolar 1 disorder (HCC)   . Depression   . Diabetes mellitus without complication (HCC)   . Incontinence of bowel   . Incontinence of urine   . Neck pain   . Osteoarthritis   . Osteonecrosis (HCC)    left foot  . Schizoaffective disorder [facility])     Past Surgical History:  Procedure Laterality Date  . botox injections in bladder    . CHOLECYSTECTOMY    . ELBOW FRACTURE SURGERY    . FOOT SURGERY    . HERNIA REPAIR    . LITHOTRIPSY    . NASAL SINUS SURGERY         HPI  from the history and physical done on the day of admission:     Dennis Yang  is a 33 y.o. male, with history of schizoaffective disorder, osteonecrosis, incontinence of bowel and  bladder, diabetes mellitus, anxiety, and recurrent pancreatitis.  Patient reports that he has had about 30 spells of pancreatitis since he was diagnosed in 2018.  Today patient reports that he had just eaten dinner when he started having abdominal pain 8 PM.  Dinner was a ham and cheese sub.  It was a left upper quadrant and a burning sensation.  Patient did not associate with fever, chest pain, palpitations, diarrhea, constipation.  He did associated with nausea.  Patient reports that he is supposed to have nausea medicine at home, but could not find it, so they decided to come into the ER.  Patient reports that the pain is 10 out of 10.  Patient reports he has had a cholecystectomy since he was diagnosed with pancreatitis for the first time.  He is not on any new medications since he was diagnosed with pancreatitis the first time.  He has not been on steroids, he does not drink alcohol, he does  not report any trauma to his pancreas, courser no scorpion bites, he does have high cholesterol which he takes a statin for.  Medication list reviewed and the only medication that could be at higher risk of causing pancreatitis his the simvastatin.  ED course  Urine tox screen did not detect any substances.  Occult fecal blood was negative.  CT abdomen and pelvis showed #1 pancreatic head calcifications consistent with chronic pancreatitis.  No acute pancreatic inflammation.  #2 bilateral nonobstructing renal calculi.  Bilateral ureteral prominence without hydronephrosis or obstructing stone. 3.  Hepatic steatosis urinalysis showed glycosuria, but otherwise is not suspicious for an infection.  Chemistry profile revealed a glucose of 118, creatinine of 1.28, alkaline phosphatase of 130, AST 29 ALT 51, troponin stable x2.  Hematology shows no leukocytosis, does show anemia of 11.5 hemoglobin.  [facility] [place] his hemoglobin there was 10.9.  His admission there was from August 24-26  and he was treated for acute on chronic pancreatitis   Hospital Course:     A/p  Acute on Chronic Pancreatitis-- -patient tolerated liquid diet well, diet was advanced and he tolerated solids well as well -Abdominal pain improved significantly -CT abdomen and pelvis without new acute findings, patient does have chronic pancreatitis type findings, lipase not elevated --Clinically much improved, tolerating oral intake, pain improved, discharged home -Patient [place] -[facility] from his recent hospitalization from 08/03/2019 to  07/30/2019 reviewed  DM2-A1c 7.4, --- okay to resume home insulin regimen  Schizoaffective disorder/anxiety--- stable, continue Abilify 15 mg daily, continue Lexapro 20 mg daily, continue hydroxyzine   Chronic anemia--hemoglobin stable around 11 which is close to patient's baseline, stool occult blood negative, no evidence of ongoing bleeding  H/o osteomyelitis of left foot,--wound VAC in situ, patient states he is status post bone graft--- w discussed with general surgeon --Wound VAC removed, dressing changes applied, patient advised to follow-up with his podiatrist (Dr. Karlene Linemanlemes) as outlined in discharge instructions   Discharge Condition: Stable  Follow UP--- as outlined in discharge instructions  Diet and Activity recommendation:  As advised  Discharge Instructions    Discharge Instructions    Call MD for:  difficulty breathing, headache or visual disturbances   Complete by: As directed    Call MD for:  persistant dizziness or light-headedness   Complete by: As directed    Call MD for:  persistant nausea and vomiting   Complete by: As directed    Call MD for:  severe uncontrolled pain   Complete by: As directed    Call MD for:  temperature >100.4   Complete by: As directed    Diet - low sodium heart healthy   Complete by: As directed    Discharge instructions   Complete by:  As directed    1) follow-up with your podiatrist Dr. Coralee PesaPhilip Clemens as previously scheduled for wound check and possible wound VAC  2) [place] [facility] for ongoing management of your chronic pancreatitis--- you may need Creon tablets if you get persistent diarrhea 3) follow-up with  primary care doctor Dr. Langston MaskerMorris for recheck and reevaluation within 1 week 4)  outpatient psychiatric follow-up for adjustment of your psychiatric medications advised   Increase activity slowly   Complete by: As directed       Discharge Medications     Allergies as of 08/13/2019      Reactions   Penicillins Anaphylaxis  Did it involve swelling of the face/tongue/throat, SOB, or low BP? Yes Did it involve sudden or severe rash/hives, skin peeling, or any reaction on the inside of your mouth or nose? Yes Did you need to seek medical attention at a hospital or doctor's office? Yes When did it last happen? If all above answers are "NO", may proceed with cephalosporin use.   Ciprofloxacin Hives      Medication List    TAKE these medications   acetaminophen 325 MG tablet Commonly known as: TYLENOL Take 2 tablets (650 mg total) by mouth every 6 (six) hours as needed for mild pain, fever or headache (or Fever >/= 101).   ARIPiprazole 15 MG tablet Commonly known as: ABILIFY Take 15 mg by mouth daily.   Atrovent HFA 17 MCG/ACT inhaler Generic drug: ipratropium Inhale 1 puff into the lungs every 4 (four) hours as needed for wheezing.   Carafate 1 g tablet Generic drug: sucralfate Take 1 g by mouth 2 (two) times daily.   celecoxib 100 MG capsule Commonly known as: CELEBREX Take 1 capsule (100 mg total) by mouth daily with breakfast. What changed: when to take this   docusate sodium 100 MG capsule Commonly known as: COLACE Take 100 mg by mouth daily.   Emgality 120 MG/ML Soaj Generic drug: Galcanezumab-gnlm Inject 1 Dose into the muscle every 30  (thirty) days.   escitalopram 20 MG tablet Commonly known as: LEXAPRO Take 20 mg by mouth daily.   Farxiga 10 MG Tabs tablet Generic drug: dapagliflozin propanediol Take 10 mg by mouth daily.   fluticasone 50 MCG/ACT nasal spray Commonly known as: FLONASE Place 1 spray into both nostrils daily.   glipiZIDE 10 MG tablet Commonly known as: GLUCOTROL Take 10 mg by mouth 2 (two) times daily.   hydrOXYzine 50 MG tablet Commonly known as: ATARAX/VISTARIL Take 50 mg by mouth 4 (four) times daily.   Levemir FlexTouch 100 UNIT/ML Pen Generic drug: Insulin Detemir Inject 10-30 Units into the skin See admin instructions. 30 units in the morning and 30 units at bedtime   levocetirizine 5 MG tablet Commonly known as: XYZAL Take 5 mg by mouth every evening.   montelukast 10 MG tablet Commonly known as: SINGULAIR Take 10 mg by mouth daily.   NovoLOG FlexPen 100 UNIT/ML FlexPen Generic drug: insulin aspart Inject 15 Units into the skin 3 (three) times daily with meals.   omeprazole 40 MG capsule Commonly known as: PRILOSEC Take 1 capsule (40 mg total) by mouth daily.   oxybutynin 5 MG tablet Commonly known as: DITROPAN Take 1 mg by mouth 3 (three) times daily.   prazosin 2 MG capsule Commonly known as: MINIPRESS Take 1 capsule (2 mg total) by mouth at bedtime. What changed:   medication strength  how much to take   pregabalin 200 MG capsule Commonly known as: LYRICA Take 200 mg by mouth 2 (two) times daily.   propranolol 40 MG tablet Commonly known as: INDERAL Take 40 mg by mouth 2 (two) times daily.   simvastatin 20 MG tablet Commonly known as: ZOCOR Take 20 mg by mouth daily at 6 PM.   topiramate 100 MG tablet Commonly known as: TOPAMAX Take 100 mg by mouth 2 (two) times daily.   Vitamin D3 1.25 MG (50000 UT) Caps Take 1 capsule by mouth every Monday, Wednesday, and Friday. 3 times a week   Xiidra 5 % Soln Generic drug: Lifitegrast Place 2 drops into both  eyes daily.  Major procedures and Radiology Reports - PLEASE review detailed and final reports for all details, in brief -    Ct Abdomen Pelvis W Contrast  Result Date: 08/12/2019 CLINICAL DATA:  Pancreatitis suspected, atypical presentation/labs Abdominal and flank pain. Back pain. Headache. Nausea and diarrhea. Blood in stool. EXAM: CT ABDOMEN AND PELVIS WITH CONTRAST TECHNIQUE: Multidetector CT imaging of the abdomen and pelvis was performed using the standard protocol following bolus administration of intravenous contrast. CONTRAST:  75mL OMNIPAQUE IOHEXOL 300 MG/ML  SOLN COMPARISON:  Multiple prior exams most recently 05/15/2019. FINDINGS: Lower chest: Linear atelectasis in the right lower lobe. Additional hypoventilatory changes dependently. No pleural fluid. Hepatobiliary: Decreased hepatic density consistent with steatosis. No focal abnormality. Clips in the gallbladder fossa postcholecystectomy. No biliary dilatation. Pancreas: Calcifications in the pancreatic head and uncinate process again seen. No ductal dilatation or inflammation. Spleen: Normal in size without focal abnormality. Adrenals/Urinary Tract: Normal adrenal glands. No perinephric edema of either kidney. Multiple nonobstructing stones in the right kidney. Mild right ureteral prominence without frank hydronephrosis. No right ureteral stone. Punctate nonobstructing stones in the lower left kidney. Mild left pelvicaliectasis and ureteral prominence without ureteral stone. Urinary bladder is physiologically distended. No bladder stone or wall thickening. Stomach/Bowel: Stomach is distended with ingested contents. Appendix appears normal. No evidence of bowel wall thickening, distention, or inflammatory changes. Vascular/Lymphatic: Multiple small retroperitoneal and central mesenteric nodes, not enlarged by size criteria. Portal vein is patent. Splenic vein is patent. Normal caliber abdominal aorta. No acute vascular findings.  Reproductive: Prostate is unremarkable. Other: No free air, free fluid, or intra-abdominal fluid collection. Small fat containing umbilical hernia. Musculoskeletal: Interval callus formation about the left iliac bone since prior exam. Scoliotic curvature of spine. There are no acute or suspicious osseous abnormalities. IMPRESSION: 1. Pancreatic head calcifications consistent with chronic pancreatitis. No acute pancreatic inflammation. 2. Bilateral nonobstructing renal calculi. Bilateral ureteral prominence without hydronephrosis or obstructing stone. 3. Hepatic steatosis. Electronically Signed   By: Narda RutherfordMelanie  Sanford M.D.   On: 08/12/2019 01:53    Micro Results   Recent Results (from the past 240 hour(s))  SARS CORONAVIRUS 2 (TAT 6-24 HRS) Nasopharyngeal Nasopharyngeal Swab     Status: None   Collection Time: 08/12/19  4:13 AM   Specimen: Nasopharyngeal Swab  Result Value Ref Range Status   SARS Coronavirus 2 NEGATIVE NEGATIVE Final    Comment: (NOTE) SARS-CoV-2 target nucleic acids are NOT DETECTED. The SARS-CoV-2 RNA is generally detectable in upper and lower respiratory specimens during the acute phase of infection. Negative results do not preclude SARS-CoV-2 infection, do not rule out co-infections with other pathogens, and should not be used as the sole basis for treatment or other patient management decisions. Negative results must be combined with clinical observations, patient history, and epidemiological information. The expected result is Negative. Fact Sheet for Patients: HairSlick.nohttps://www.fda.gov/media/138098/download Fact Sheet for Healthcare Providers: quierodirigir.comhttps://www.fda.gov/media/138095/download This test is not yet approved or cleared by the [place] FDA and  has been authorized for detection and/or diagnosis of SARS-CoV-2 by FDA under an Emergency Use Authorization (EUA). This EUA will remain  in effect (meaning this test can be used) for the duration of the COVID-19  declaration under Section 56 4(b)(1) of the Act, 21 U.S.C. section 360bbb-3(b)(1), unless the authorization is terminated or revoked sooner. Performed at [facility], [address] [place], [place] [address]     Today   Subjective    Dennis Yang today has no New complaints  --Tolerating oral  intake well without significant nausea or emesis --Abdominal pain is much better          Patient has been seen and examined prior to discharge   Objective   Blood pressure 128/70, pulse 72, temperature 97.6 F (36.4 C), temperature source Oral, resp. rate 20, height 6' (1.829 m), weight 106.6 kg, SpO2 100 %.   Intake/Output Summary (Last 24 hours) at 08/13/2019 1158 Last data filed at 08/13/2019 0929 Gross per 24 hour  Intake 2087.04 ml  Output 3300 ml  Net -1212.96 ml    Exam Gen:- Awake Alert, no acute distress  HEENT:- [place].AT, No sclera icterus Neck-Supple Neck,No JVD,.  Lungs-  CTAB , good air movement bilaterally  CV- S1, S2 normal, regular Abd-  +ve B.Sounds, Abd Soft, No tenderness,    Extremity/Skin:- No  edema,   good pulses Psych-affect is appropriate, oriented x3 Neuro-no new focal deficits, no tremors  MSK--left foot with dressing on, walking boot  Data Review   CBC w Diff:  Lab Results  Component Value Date   WBC 5.1 08/12/2019   HGB 10.8 (L) 08/12/2019   HCT 36.4 (L) 08/12/2019   PLT 209 08/12/2019   LYMPHOPCT 42 08/12/2019   MONOPCT 10 08/12/2019   EOSPCT 2 08/12/2019   BASOPCT 1 08/12/2019    CMP:  Lab Results  Component Value Date   NA 139 08/12/2019   K 4.1 08/12/2019   CL 114 (H) 08/12/2019   CO2 21 (L) 08/12/2019   BUN 18 08/12/2019   CREATININE 1.14 08/12/2019   PROT 6.1 (L) 08/12/2019   ALBUMIN 3.8 08/12/2019   BILITOT 0.4 08/12/2019   ALKPHOS 107 08/12/2019   AST 26 08/12/2019   ALT 44 08/12/2019  .   Total Discharge time is about 33 minutes  Shon Hale M.D on 08/13/2019 at 11:58 AM  Go to [email or web address] -  for  contact info  [facility] - Office  [phone]

## 2019-10-11 ENCOUNTER — Emergency Department (HOSPITAL_COMMUNITY)
Admission: EM | Admit: 2019-10-11 | Discharge: 2019-10-12 | Disposition: A | Discharge status: Home / Self Care | Payer: Medicaid - Out of State | Attending: Emergency Medicine | Admitting: Emergency Medicine

## 2019-10-11 ENCOUNTER — Encounter (HOSPITAL_COMMUNITY): Payer: Self-pay | Admitting: Emergency Medicine

## 2019-10-11 ENCOUNTER — Other Ambulatory Visit: Payer: Self-pay

## 2019-10-11 DIAGNOSIS — E119 Type 2 diabetes mellitus without complications: Secondary | ICD-10-CM | POA: Diagnosis not present

## 2019-10-11 DIAGNOSIS — Z79899 Other long term (current) drug therapy: Secondary | ICD-10-CM | POA: Insufficient documentation

## 2019-10-11 DIAGNOSIS — R519 Headache, unspecified: Secondary | ICD-10-CM | POA: Diagnosis present

## 2019-10-11 DIAGNOSIS — Z87891 Personal history of nicotine dependence: Secondary | ICD-10-CM | POA: Diagnosis not present

## 2019-10-11 DIAGNOSIS — G43809 Other migraine, not intractable, without status migrainosus: Secondary | ICD-10-CM | POA: Diagnosis not present

## 2019-10-11 DIAGNOSIS — Z794 Long term (current) use of insulin: Secondary | ICD-10-CM | POA: Diagnosis not present

## 2019-10-11 HISTORY — DX: Transient cerebral ischemic attack, unspecified: G45.9

## 2019-10-11 MED ORDER — DEXAMETHASONE SODIUM PHOSPHATE 10 MG/ML IJ SOLN
10.0000 mg | Freq: Once | INTRAMUSCULAR | Status: DC
  Filled 2019-10-11: qty 1

## 2019-10-11 MED ORDER — DIPHENHYDRAMINE HCL 50 MG/ML IJ SOLN
25.0000 mg | Freq: Once | INTRAMUSCULAR | Status: DC
  Filled 2019-10-11: qty 1

## 2019-10-11 MED ORDER — KETOROLAC TROMETHAMINE 30 MG/ML IJ SOLN
30.0000 mg | Freq: Once | INTRAMUSCULAR | Status: DC
  Filled 2019-10-11: qty 1

## 2019-10-11 MED ORDER — SODIUM CHLORIDE 0.9 % IV BOLUS: 1000.0000 mL | Freq: Once | INTRAVENOUS | Status: DC

## 2019-10-11 MED ORDER — METOCLOPRAMIDE HCL 5 MG/ML IJ SOLN
10.0000 mg | Freq: Once | INTRAMUSCULAR | Status: DC
  Filled 2019-10-11: qty 2

## 2019-10-11 NOTE — ED Provider Notes (Signed)
[facility] EMERGENCY DEPARTMENT Provider Note   CSN: [id number] Arrival date & time: 10/11/19  2309     History   Chief Complaint Chief Complaint  Patient presents with  . Headache    HPI Dennis Yang is a 33 y.o. male.     Patient is a 33 year old male with past medical history of diabetes, bipolar disorder, schizoaffective disorder, anxiety, and prior pancreatitis.  He presents today for evaluation of headache.  Patient states he has a pressure in his head with associated numbness to the left side of his body.  This began in the absence of any injury or trauma.  He denies any visual disturbances.  He feels nauseated, but has not vomited.  Patient also has multiple other complaints including abdominal pain, chest pain, and having "orange poop".  Patient initially took the ambulance to [facility], but his dad brought him here because of increased wait times there.  Upon reviewing this patient's chart, he has had multiple visits this year at both [place] and here.  He has had multiple CT scans of his head and abdomen throughout the year.  All of these have been negative.  The history is provided by the patient.  Headache Pain location:  Generalized Quality: Pressure. Radiates to:  Does not radiate Onset quality:  Gradual Duration:  12 hours Timing:  Constant Progression:  Worsening Chronicity:  Recurrent Similar to prior headaches: yes   Relieved by:  Nothing Worsened by:  Nothing Ineffective treatments:  None tried   Past Medical History:  Diagnosis Date  . Anxiety   . Back pain   . Bipolar 1 disorder (HCC)   . Depression   . Diabetes mellitus without complication (HCC)   . Incontinence of bowel   . Incontinence of urine   . Neck pain   . Osteoarthritis   . Osteonecrosis (HCC)    left foot  . Schizoaffective disorder (HCC)   . TIA (transient ischemic attack)     Patient Active Problem List   Diagnosis Date Noted  . Acute on chronic/recurrent  pancreatitis 08/12/2019  . Acute on chronic/recurrent pancreatitis 01/27/2019  . Bipolar 1 disorder (HCC) 01/27/2019  . Depression with anxiety 01/27/2019  . Type 2 diabetes mellitus without complication (HCC) 01/27/2019    Past Surgical History:  Procedure Laterality Date  . botox injections in bladder    . CHOLECYSTECTOMY    . ELBOW FRACTURE SURGERY    . FOOT SURGERY    . HERNIA REPAIR    . LITHOTRIPSY    . NASAL SINUS SURGERY          Home Medications    Prior to Admission medications   Medication Sig Start Date End Date Taking? Authorizing Provider  acetaminophen (TYLENOL) 325 MG tablet Take 2 tablets (650 mg total) by mouth every 6 (six) hours as needed for mild pain, fever or headache (or Fever >/= 101). 08/13/19   Emokpae, Courage, MD  ARIPiprazole (ABILIFY) 15 MG tablet Take 15 mg by mouth daily.    [provider]  ATROVENT HFA 17 MCG/ACT inhaler Inhale 1 puff into the lungs every 4 (four) hours as needed for wheezing.  01/16/19   [provider]  CARAFATE 1 g tablet Take 1 g by mouth 2 (two) times daily.  12/20/18   [provider]  celecoxib (CELEBREX) 100 MG capsule Take 1 capsule (100 mg total) by mouth daily with breakfast. 08/13/19   Shon Hale, MD  Cholecalciferol (VITAMIN D3) 1.25 MG (  50000 UT) CAPS Take 1 capsule by mouth every Monday, Wednesday, and Friday. 3 times a week 12/24/18   [provider]  docusate sodium (COLACE) 100 MG capsule Take 100 mg by mouth daily.  01/01/19   [provider]  EMGALITY 120 MG/ML SOAJ Inject 1 Dose into the muscle every 30 (thirty) days.  11/24/18   [provider]  escitalopram (LEXAPRO) 20 MG tablet Take 20 mg by mouth daily.  01/22/19   [provider]  FARXIGA 10 MG TABS tablet Take 10 mg by mouth daily.  01/07/19   [provider]  fluticasone (FLONASE) 50 MCG/ACT nasal spray Place 1 spray into both nostrils daily.  01/20/19   [provider]   glipiZIDE (GLUCOTROL) 10 MG tablet Take 10 mg by mouth 2 (two) times daily. 01/19/19   [provider]  hydrOXYzine (ATARAX/VISTARIL) 50 MG tablet Take 50 mg by mouth 4 (four) times daily. 01/22/19   [provider]  Insulin Detemir (LEVEMIR FLEXTOUCH) 100 UNIT/ML Pen Inject 10-30 Units into the skin See admin instructions. 30 units in the morning and 30 units at bedtime 03/31/19   [provider]  levocetirizine (XYZAL) 5 MG tablet Take 5 mg by mouth every evening.  01/15/19   [provider]  montelukast (SINGULAIR) 10 MG tablet Take 10 mg by mouth daily.  01/12/19   [provider]  NOVOLOG FLEXPEN 100 UNIT/ML FlexPen Inject 15 Units into the skin 3 (three) times daily with meals.  04/27/19   [provider]  omeprazole (PRILOSEC) 40 MG capsule Take 1 capsule (40 mg total) by mouth daily. 08/13/19   Shon HaleEmokpae, Courage, MD  oxybutynin (DITROPAN) 5 MG tablet Take 1 mg by mouth 3 (three) times daily. 03/31/19   [provider]  prazosin (MINIPRESS) 2 MG capsule Take 1 capsule (2 mg total) by mouth at bedtime. 08/13/19   Shon HaleEmokpae, Courage, MD  pregabalin (LYRICA) 200 MG capsule Take 200 mg by mouth 2 (two) times daily.    [provider]  propranolol (INDERAL) 40 MG tablet Take 40 mg by mouth 2 (two) times daily.  01/12/19   [provider]  simvastatin (ZOCOR) 20 MG tablet Take 20 mg by mouth daily at 6 PM.  12/25/18   [provider]  topiramate (TOPAMAX) 100 MG tablet Take 100 mg by mouth 2 (two) times daily. 05/18/19   [provider]  XIIDRA 5 % SOLN Place 2 drops into both eyes daily.  01/21/19   [provider]    Family History No family history on file.  Social History Social History   Tobacco Use  . Smoking status: Former Games developermoker  . Smokeless tobacco: Never Used  Substance Use Topics  . Alcohol use: Not Currently  . Drug use: Not Currently     Allergies   Penicillins and Ciprofloxacin    Review of Systems Review of Systems  Neurological: Positive for headaches.  All other systems reviewed and are negative.    Physical Exam Updated Vital Signs BP 119/85 (BP Location: Right Arm)   Pulse 95   Temp 98 F (36.7 C)   Resp 18   Ht 6' (1.829 m)   Wt 104.8 kg   SpO2 100%   BMI 31.33 kg/m   Physical Exam Vitals signs and nursing note reviewed.  Constitutional:      General: He is not in acute distress.    Appearance: He is well-developed. He is not diaphoretic.  HENT:  Head: Normocephalic and atraumatic.  Eyes:     General: No visual field deficit.    Extraocular Movements: Extraocular movements intact.     Pupils: Pupils are equal, round, and reactive to light.  Neck:     Musculoskeletal: Normal range of motion and neck supple.  Cardiovascular:     Rate and Rhythm: Normal rate and regular rhythm.     Heart sounds: No murmur. No friction rub.  Pulmonary:     Effort: Pulmonary effort is normal. No respiratory distress.     Breath sounds: Normal breath sounds. No wheezing or rales.  Abdominal:     General: Bowel sounds are normal. There is no distension.     Palpations: Abdomen is soft.     Tenderness: There is no abdominal tenderness.  Musculoskeletal: Normal range of motion.  Skin:    General: Skin is warm and dry.  Neurological:     Mental Status: He is alert and oriented to person, place, and time.     Cranial Nerves: No cranial nerve deficit, dysarthria or facial asymmetry.     Sensory: No sensory deficit.     Motor: No weakness.     Coordination: Coordination normal.      ED Treatments / Results  Labs (all labs ordered are listed, but only abnormal results are displayed) Labs Reviewed  COMPREHENSIVE METABOLIC PANEL  LIPASE, BLOOD  CBC WITH DIFFERENTIAL/PLATELET    EKG None  Radiology No results found.  Procedures Procedures (including critical care time)  Medications Ordered in ED Medications  sodium chloride 0.9 % bolus 1,000  mL (has no administration in time range)  ketorolac (TORADOL) 30 MG/ML injection 30 mg (has no administration in time range)  dexamethasone (DECADRON) injection 10 mg (has no administration in time range)  diphenhydrAMINE (BENADRYL) injection 25 mg (has no administration in time range)  metoCLOPramide (REGLAN) injection 10 mg (has no administration in time range)     Initial Impression / Assessment and Plan / ED Course  I have reviewed the triage vital signs and the nursing notes.  Pertinent labs & imaging results that were available during my care of the patient were reviewed by me and considered in my medical decision making (see chart for details).  Patient with history of migraine headaches presenting with complaints of headache and numbness to the left side of his body.  I suspect that this represents a complex migraine.  He is neurologically intact and has had multiple CT scans both of his head and abdomen throughout the year, most recently a CT of the head 2 weeks ago in [place].  I see no indication for repeating a head CT or other studies.  He seems to be feeling better after receiving Toradol and Phenergan.  Laboratory studies are all unremarkable.  He is to follow-up with primary doctor if not improving.  Final Clinical Impressions(s) / ED Diagnoses   Final diagnoses:  None    ED Discharge Orders    None       Veryl Speak, MD 10/12/19 [phone]

## 2019-10-11 NOTE — ED Triage Notes (Signed)
Pt states he had a TIA 3 weeks ago and states that he started having a severe headache 3 hrs ago and weakness of L side. Pt also c/o abdominal pain and diarrhea that is "yellowish-orange" in color.

## 2019-10-11 NOTE — ED Notes (Signed)
Pt states that he went by EMS to [facility] but the wait was too long so his father picked him up and brought him here instead.

## 2019-10-12 LAB — CBC WITH DIFFERENTIAL/PLATELET
Abs Immature Granulocytes: 0.04 10*3/uL (ref 0.00–0.07)
Basophils Absolute: 0.1 10*3/uL (ref 0.0–0.1)
Basophils Relative: 1 %
Eosinophils Absolute: 0.1 10*3/uL (ref 0.0–0.5)
Eosinophils Relative: 2 %
HCT: 41.4 % (ref 39.0–52.0)
Hemoglobin: 13 g/dL (ref 13.0–17.0)
Immature Granulocytes: 1 %
Lymphocytes Relative: 38 %
Lymphs Abs: 2.6 10*3/uL (ref 0.7–4.0)
MCH: 26.1 pg (ref 26.0–34.0)
MCHC: 31.4 g/dL (ref 30.0–36.0)
MCV: 83 fL (ref 80.0–100.0)
Monocytes Absolute: 0.5 10*3/uL (ref 0.1–1.0)
Monocytes Relative: 8 %
Neutro Abs: 3.5 10*3/uL (ref 1.7–7.7)
Neutrophils Relative %: 50 %
Platelets: 186 10*3/uL (ref 150–400)
RBC: 4.99 MIL/uL (ref 4.22–5.81)
RDW: 16.4 % — ABNORMAL HIGH (ref 11.5–15.5)
WBC: 6.9 10*3/uL (ref 4.0–10.5)
nRBC: 0 % (ref 0.0–0.2)

## 2019-10-12 LAB — COMPREHENSIVE METABOLIC PANEL
ALT: 38 U/L (ref 0–44)
AST: 21 U/L (ref 15–41)
Albumin: 4 g/dL (ref 3.5–5.0)
Alkaline Phosphatase: 134 U/L — ABNORMAL HIGH (ref 38–126)
Anion gap: 7 (ref 5–15)
BUN: 17 mg/dL (ref 6–20)
CO2: 17 mmol/L — ABNORMAL LOW (ref 22–32)
Calcium: 8.5 mg/dL — ABNORMAL LOW (ref 8.9–10.3)
Chloride: 112 mmol/L — ABNORMAL HIGH (ref 98–111)
Creatinine, Ser: 0.87 mg/dL (ref 0.61–1.24)
GFR calc Af Amer: >60 mL/min (ref >60)
GFR calc non Af Amer: >60 mL/min (ref >60)
Glucose, Bld: 190 mg/dL — ABNORMAL HIGH (ref 70–99)
Potassium: 3.7 mmol/L (ref 3.5–5.1)
Sodium: 136 mmol/L (ref 135–145)
Total Bilirubin: 0.5 mg/dL (ref 0.3–1.2)
Total Protein: 6.7 g/dL (ref 6.5–8.1)

## 2019-10-12 LAB — LIPASE, BLOOD: Lipase: 33 U/L (ref 11–51)

## 2019-10-12 MED ORDER — KETOROLAC TROMETHAMINE 60 MG/2ML IM SOLN
60.0000 mg | Freq: Once | INTRAMUSCULAR | Status: AC
  Administered 2019-10-12: 60 mg via INTRAMUSCULAR
  Filled 2019-10-12: qty 2

## 2019-10-12 MED ORDER — PROMETHAZINE HCL 25 MG/ML IJ SOLN
25.0000 mg | Freq: Once | INTRAMUSCULAR | Status: AC
  Administered 2019-10-12: 25 mg via INTRAMUSCULAR
  Filled 2019-10-12: qty 1

## 2019-10-12 NOTE — Discharge Instructions (Addendum)
Follow-up with your primary doctor in the next few days.  Take ibuprofen 600 mg rotated with Tylenol 1000 mg every 4 hours as needed for pain.

## 2019-12-16 ENCOUNTER — Inpatient Hospital Stay (HOSPITAL_COMMUNITY)
Admission: EM | Admit: 2019-12-16 | Discharge: 2019-12-19 | DRG: 440 | Disposition: A | Discharge status: Home / Self Care | Payer: Medicaid - Out of State | Attending: Family Medicine | Admitting: Family Medicine

## 2019-12-16 ENCOUNTER — Encounter (HOSPITAL_COMMUNITY): Payer: Self-pay | Admitting: Emergency Medicine

## 2019-12-16 ENCOUNTER — Other Ambulatory Visit: Payer: Self-pay

## 2019-12-16 DIAGNOSIS — Z20822 Contact with and (suspected) exposure to covid-19: Secondary | ICD-10-CM | POA: Diagnosis present

## 2019-12-16 DIAGNOSIS — Z9049 Acquired absence of other specified parts of digestive tract: Secondary | ICD-10-CM

## 2019-12-16 DIAGNOSIS — K8681 Exocrine pancreatic insufficiency: Secondary | ICD-10-CM | POA: Diagnosis present

## 2019-12-16 DIAGNOSIS — Z8673 Personal history of transient ischemic attack (TIA), and cerebral infarction without residual deficits: Secondary | ICD-10-CM

## 2019-12-16 DIAGNOSIS — K859 Acute pancreatitis without necrosis or infection, unspecified: Secondary | ICD-10-CM | POA: Diagnosis not present

## 2019-12-16 DIAGNOSIS — F419 Anxiety disorder, unspecified: Secondary | ICD-10-CM | POA: Diagnosis present

## 2019-12-16 DIAGNOSIS — K861 Other chronic pancreatitis: Secondary | ICD-10-CM | POA: Diagnosis not present

## 2019-12-16 DIAGNOSIS — F319 Bipolar disorder, unspecified: Secondary | ICD-10-CM | POA: Diagnosis not present

## 2019-12-16 DIAGNOSIS — Z79899 Other long term (current) drug therapy: Secondary | ICD-10-CM

## 2019-12-16 DIAGNOSIS — F259 Schizoaffective disorder, unspecified: Secondary | ICD-10-CM | POA: Diagnosis present

## 2019-12-16 DIAGNOSIS — R1013 Epigastric pain: Secondary | ICD-10-CM

## 2019-12-16 DIAGNOSIS — Z881 Allergy status to other antibiotic agents status: Secondary | ICD-10-CM

## 2019-12-16 DIAGNOSIS — E860 Dehydration: Secondary | ICD-10-CM | POA: Diagnosis present

## 2019-12-16 DIAGNOSIS — R300 Dysuria: Secondary | ICD-10-CM | POA: Diagnosis not present

## 2019-12-16 DIAGNOSIS — E876 Hypokalemia: Secondary | ICD-10-CM | POA: Diagnosis present

## 2019-12-16 DIAGNOSIS — E119 Type 2 diabetes mellitus without complications: Secondary | ICD-10-CM

## 2019-12-16 DIAGNOSIS — Z6831 Body mass index (BMI) 31.0-31.9, adult: Secondary | ICD-10-CM

## 2019-12-16 DIAGNOSIS — Z87891 Personal history of nicotine dependence: Secondary | ICD-10-CM

## 2019-12-16 DIAGNOSIS — Z794 Long term (current) use of insulin: Secondary | ICD-10-CM

## 2019-12-16 DIAGNOSIS — E1140 Type 2 diabetes mellitus with diabetic neuropathy, unspecified: Secondary | ICD-10-CM | POA: Diagnosis present

## 2019-12-16 DIAGNOSIS — I10 Essential (primary) hypertension: Secondary | ICD-10-CM | POA: Diagnosis present

## 2019-12-16 DIAGNOSIS — E663 Overweight: Secondary | ICD-10-CM | POA: Diagnosis present

## 2019-12-16 DIAGNOSIS — K219 Gastro-esophageal reflux disease without esophagitis: Secondary | ICD-10-CM | POA: Diagnosis present

## 2019-12-16 HISTORY — DX: Acute pancreatitis without necrosis or infection, unspecified: K85.90

## 2019-12-16 HISTORY — DX: Polyneuropathy, unspecified: G62.9

## 2019-12-16 LAB — COMPREHENSIVE METABOLIC PANEL
ALT: 67 U/L — ABNORMAL HIGH (ref 0–44)
AST: 38 U/L (ref 15–41)
Albumin: 3.6 g/dL (ref 3.5–5.0)
Alkaline Phosphatase: 104 U/L (ref 38–126)
Anion gap: 13 (ref 5–15)
BUN: 14 mg/dL (ref 6–20)
CO2: 22 mmol/L (ref 22–32)
Calcium: 8.6 mg/dL — ABNORMAL LOW (ref 8.9–10.3)
Chloride: 99 mmol/L (ref 98–111)
Creatinine, Ser: 0.9 mg/dL (ref 0.61–1.24)
GFR calc Af Amer: >60 mL/min (ref >60)
GFR calc non Af Amer: >60 mL/min (ref >60)
Glucose, Bld: 168 mg/dL — ABNORMAL HIGH (ref 70–99)
Potassium: 3.2 mmol/L — ABNORMAL LOW (ref 3.5–5.1)
Sodium: 134 mmol/L — ABNORMAL LOW (ref 135–145)
Total Bilirubin: 1.5 mg/dL — ABNORMAL HIGH (ref 0.3–1.2)
Total Protein: 6.6 g/dL (ref 6.5–8.1)

## 2019-12-16 LAB — CBC WITH DIFFERENTIAL/PLATELET
Abs Immature Granulocytes: 0.05 10*3/uL (ref 0.00–0.07)
Basophils Absolute: 0 10*3/uL (ref 0.0–0.1)
Basophils Relative: 0 %
Eosinophils Absolute: 0.2 10*3/uL (ref 0.0–0.5)
Eosinophils Relative: 2 %
HCT: 44.5 % (ref 39.0–52.0)
Hemoglobin: 15.1 g/dL (ref 13.0–17.0)
Immature Granulocytes: 1 %
Lymphocytes Relative: 16 %
Lymphs Abs: 1.6 10*3/uL (ref 0.7–4.0)
MCH: 28.8 pg (ref 26.0–34.0)
MCHC: 33.9 g/dL (ref 30.0–36.0)
MCV: 84.9 fL (ref 80.0–100.0)
Monocytes Absolute: 0.8 10*3/uL (ref 0.1–1.0)
Monocytes Relative: 8 %
Neutro Abs: 7.9 10*3/uL — ABNORMAL HIGH (ref 1.7–7.7)
Neutrophils Relative %: 73 %
Platelets: 197 10*3/uL (ref 150–400)
RBC: 5.24 MIL/uL (ref 4.22–5.81)
RDW: 13.4 % (ref 11.5–15.5)
WBC: 10.6 10*3/uL — ABNORMAL HIGH (ref 4.0–10.5)
nRBC: 0 % (ref 0.0–0.2)

## 2019-12-16 LAB — CBG MONITORING, ED: Glucose-Capillary: 111 mg/dL — ABNORMAL HIGH (ref 70–99)

## 2019-12-16 LAB — LIPASE, BLOOD: Lipase: 292 U/L — ABNORMAL HIGH (ref 11–51)

## 2019-12-16 LAB — GLUCOSE, CAPILLARY: Glucose-Capillary: 112 mg/dL — ABNORMAL HIGH (ref 70–99)

## 2019-12-16 MED ORDER — SODIUM CHLORIDE 0.9 % IV BOLUS
1000.0000 mL | Freq: Once | INTRAVENOUS | Status: AC
  Administered 2019-12-16: 11:00:00 1000 mL via INTRAVENOUS

## 2019-12-16 MED ORDER — ONDANSETRON HCL 4 MG PO TABS: 4.0000 mg | ORAL_TABLET | Freq: Four times a day (QID) | ORAL | Status: DC | PRN

## 2019-12-16 MED ORDER — SIMVASTATIN 20 MG PO TABS
20.0000 mg | ORAL_TABLET | Freq: Every day | ORAL | Status: DC
  Administered 2019-12-17 – 2019-12-18 (×2): 20 mg via ORAL
  Filled 2019-12-16 (×2): qty 1

## 2019-12-16 MED ORDER — INSULIN ASPART 100 UNIT/ML ~~LOC~~ SOLN
0.0000 [IU] | SUBCUTANEOUS | Status: DC
  Administered 2019-12-17: 04:00:00 2 [IU] via SUBCUTANEOUS
  Administered 2019-12-18 – 2019-12-19 (×2): 1 [IU] via SUBCUTANEOUS

## 2019-12-16 MED ORDER — HYDROMORPHONE HCL 1 MG/ML IJ SOLN
1.0000 mg | Freq: Once | INTRAMUSCULAR | Status: AC
  Administered 2019-12-16: 14:00:00 1 mg via INTRAVENOUS
  Filled 2019-12-16: qty 1

## 2019-12-16 MED ORDER — METAXALONE 800 MG PO TABS: 800.0000 mg | ORAL_TABLET | Freq: Three times a day (TID) | ORAL | Status: DC | PRN

## 2019-12-16 MED ORDER — TOPIRAMATE 100 MG PO TABS
100.0000 mg | ORAL_TABLET | Freq: Two times a day (BID) | ORAL | Status: DC
  Administered 2019-12-16 – 2019-12-19 (×6): 100 mg via ORAL
  Filled 2019-12-16 (×6): qty 1

## 2019-12-16 MED ORDER — ACETAMINOPHEN 650 MG RE SUPP: 650.0000 mg | Freq: Four times a day (QID) | RECTAL | Status: DC | PRN

## 2019-12-16 MED ORDER — SODIUM CHLORIDE 0.9 % IV SOLN: Freq: Once | INTRAVENOUS | Status: AC

## 2019-12-16 MED ORDER — MORPHINE SULFATE (PF) 4 MG/ML IV SOLN
4.0000 mg | Freq: Once | INTRAVENOUS | Status: AC
  Administered 2019-12-16: 11:00:00 4 mg via INTRAVENOUS
  Filled 2019-12-16: qty 1

## 2019-12-16 MED ORDER — ONDANSETRON HCL 4 MG/2ML IJ SOLN
4.0000 mg | Freq: Four times a day (QID) | INTRAMUSCULAR | Status: DC | PRN
  Administered 2019-12-17 – 2019-12-18 (×3): 4 mg via INTRAVENOUS
  Filled 2019-12-16 (×3): qty 2

## 2019-12-16 MED ORDER — POTASSIUM CHLORIDE 2 MEQ/ML IV SOLN
INTRAVENOUS | Status: DC
  Filled 2019-12-16 (×4): qty 1000

## 2019-12-16 MED ORDER — ACETAMINOPHEN 325 MG PO TABS: 650.0000 mg | ORAL_TABLET | Freq: Four times a day (QID) | ORAL | Status: DC | PRN

## 2019-12-16 MED ORDER — PROPRANOLOL HCL 20 MG PO TABS
40.0000 mg | ORAL_TABLET | Freq: Two times a day (BID) | ORAL | Status: DC
  Administered 2019-12-16 – 2019-12-19 (×5): 40 mg via ORAL
  Filled 2019-12-16: qty 2
  Filled 2019-12-16: qty 4
  Filled 2019-12-16 (×4): qty 2

## 2019-12-16 MED ORDER — INSULIN DETEMIR 100 UNIT/ML ~~LOC~~ SOLN
15.0000 [IU] | Freq: Two times a day (BID) | SUBCUTANEOUS | Status: DC
  Administered 2019-12-17 – 2019-12-19 (×5): 15 [IU] via SUBCUTANEOUS
  Filled 2019-12-16 (×10): qty 0.15

## 2019-12-16 MED ORDER — PANTOPRAZOLE SODIUM 40 MG IV SOLR
40.0000 mg | INTRAVENOUS | Status: DC
  Administered 2019-12-16 – 2019-12-18 (×3): 40 mg via INTRAVENOUS
  Filled 2019-12-16 (×3): qty 40

## 2019-12-16 MED ORDER — ENOXAPARIN SODIUM 40 MG/0.4ML ~~LOC~~ SOLN
40.0000 mg | SUBCUTANEOUS | Status: DC
  Administered 2019-12-16 – 2019-12-18 (×3): 40 mg via SUBCUTANEOUS
  Filled 2019-12-16 (×3): qty 0.4

## 2019-12-16 MED ORDER — LIFITEGRAST 5 % OP SOLN: 2.0000 [drp] | Freq: Every day | OPHTHALMIC | Status: DC

## 2019-12-16 MED ORDER — MONTELUKAST SODIUM 10 MG PO TABS
10.0000 mg | ORAL_TABLET | Freq: Every day | ORAL | Status: DC
  Administered 2019-12-16 – 2019-12-19 (×4): 10 mg via ORAL
  Filled 2019-12-16 (×4): qty 1

## 2019-12-16 MED ORDER — PREGABALIN 75 MG PO CAPS
200.0000 mg | ORAL_CAPSULE | Freq: Two times a day (BID) | ORAL | Status: DC
  Administered 2019-12-16 – 2019-12-19 (×6): 200 mg via ORAL
  Filled 2019-12-16 (×6): qty 1

## 2019-12-16 MED ORDER — PRAZOSIN HCL 2 MG PO CAPS
2.0000 mg | ORAL_CAPSULE | Freq: Every day | ORAL | Status: DC
  Administered 2019-12-17: 01:00:00 2 mg via ORAL
  Filled 2019-12-16 (×5): qty 1

## 2019-12-16 MED ORDER — HYDROMORPHONE HCL 1 MG/ML IJ SOLN
1.0000 mg | INTRAMUSCULAR | Status: DC | PRN
  Administered 2019-12-16 – 2019-12-19 (×12): 1 mg via INTRAVENOUS
  Filled 2019-12-16 (×12): qty 1

## 2019-12-16 MED ORDER — ESCITALOPRAM OXALATE 10 MG PO TABS
20.0000 mg | ORAL_TABLET | Freq: Every day | ORAL | Status: DC
  Administered 2019-12-16 – 2019-12-19 (×4): 20 mg via ORAL
  Filled 2019-12-16 (×4): qty 2

## 2019-12-16 MED ORDER — HYDROMORPHONE HCL 1 MG/ML IJ SOLN
1.0000 mg | INTRAMUSCULAR | Status: DC | PRN
  Administered 2019-12-16: 17:00:00 1 mg via INTRAVENOUS
  Filled 2019-12-16: qty 1

## 2019-12-16 MED ORDER — MIRTAZAPINE 15 MG PO TABS
30.0000 mg | ORAL_TABLET | Freq: Every day | ORAL | Status: DC
  Administered 2019-12-16 – 2019-12-18 (×2): 30 mg via ORAL
  Filled 2019-12-16 (×2): qty 2

## 2019-12-16 MED ORDER — POTASSIUM CHLORIDE 10 MEQ/100ML IV SOLN
10.0000 meq | INTRAVENOUS | Status: AC
  Administered 2019-12-16 (×2): 10 meq via INTRAVENOUS
  Filled 2019-12-16: qty 100

## 2019-12-16 MED ORDER — ONDANSETRON HCL 4 MG/2ML IJ SOLN
4.0000 mg | Freq: Once | INTRAMUSCULAR | Status: AC
  Administered 2019-12-16: 4 mg via INTRAVENOUS
  Filled 2019-12-16: qty 2

## 2019-12-16 MED ORDER — ARIPIPRAZOLE 5 MG PO TABS
15.0000 mg | ORAL_TABLET | Freq: Every day | ORAL | Status: DC
  Administered 2019-12-16 – 2019-12-19 (×4): 15 mg via ORAL
  Filled 2019-12-16 (×4): qty 1

## 2019-12-16 NOTE — H&P (Signed)
History and Physical    [name] NID:[id number] DOB: 05-17-1986 DOA: 12/16/2019  PCP: Beola Cord, FNP  Patient coming from: Home  I have personally briefly reviewed patient's old medical records in [place]  Chief Complaint: Abdominal pain  HPI: Dennis Yang is a 34 y.o. male with medical history significant of chronic pancreatitis, history of alcohol abuse in the past but none recently, diabetes, bipolar disorder, chronic back pain, history of cholecystectomy in the past, presents to the hospital with complaints of abdominal pain.  Reports onset of symptoms occurring on Sunday evening.  By Monday morning, he had worsening abdominal pain in the epigastrium, rating up into his chest as well as down into his flanks.  He had associated nausea and vomiting.  He reports having loose stools, but this is a chronic issue from his pancreatitis.  He had conjunctival regional for evaluation and had a CT scan done.  Report is unavailable at this time.  He reports been given Norco and discharged home.  He feels that the Norco will not adequately treat his pain.  He returned the following day and was told that he would have to wait in the emergency room.  Patient was not increasing pain and therefore came to [facility] emergency room for evaluation.  ED Course: On arrival to the emergency room, he is noted to be tachycardic with heart rate in the 140s and appears dehydrated.  Potassium is low at 3.2.  Lipase elevated at 292.  He receives IV fluids and pain management.  Is been referred for admission.  Review of Systems: General: Negative for fever, malaise, weight loss Respiratory: Feels that breathing is short related to abdominal pain (splinting), no wheezing or cough Cardiac: Negative for chest pain, palpitations GI: Positive for abdominal pain, nausea, vomiting, diarrhea Musculoskeletal.  Positive for back pain. Other systems reviewed and are negative.   Past Medical History:    Diagnosis Date  . Anxiety   . Back pain   . Bipolar 1 disorder ([place])   . Depression   . Diabetes mellitus without complication ([place])   . Incontinence of bowel   . Incontinence of urine   . Neck pain   . Neuropathy   . Osteoarthritis   . 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 Pancreatitis   . Schizoaffective disorder ([place])   . TIA (transient ischemic attack)     Past Surgical History:  Procedure Laterality Date  . botox injections in bladder    . CHOLECYSTECTOMY    . ELBOW FRACTURE SURGERY    . FOOT SURGERY    . HERNIA REPAIR    . LITHOTRIPSY    . NASAL SINUS SURGERY      Social History:  reports that he has quit smoking. He has never used smokeless tobacco. He reports previous alcohol use. He reports previous drug use.  Allergies  Allergen Reactions  . Ciprofloxacin Hives    Family history: Family history reviewed and not pertinent  Prior to Admission medications   Medication Sig Start Date End Date Taking? Authorizing Provider  acetaminophen (TYLENOL) 325 MG tablet Take 2 tablets (650 mg total) by mouth every 6 (six) hours as needed for mild pain, fever or headache (or Fever >/= 101). 08/13/19  Yes Emokpae, Courage, MD  ARIPiprazole (ABILIFY) 15 MG tablet Take 15 mg by mouth daily.   Yes [provider]  ATROVENT HFA 17 MCG/ACT inhaler Inhale 1 puff into the lungs every 4 (four) hours as needed  for wheezing.  01/16/19  Yes [provider]  CARAFATE 1 g tablet Take 1 g by mouth 2 (two) times daily.  12/20/18  Yes [provider]  celecoxib (CELEBREX) 100 MG capsule Take 1 capsule (100 mg total) by mouth daily with breakfast. Patient taking differently: Take 100 mg by mouth 2 (two) times daily.  08/13/19  Yes Emokpae, Courage, MD  cetirizine (ZYRTEC) 10 MG tablet Take 10 mg by mouth daily. 11/16/19  Yes [provider]  Cholecalciferol (VITAMIN D3) 1.25 MG (50000 UT) CAPS Take 1 capsule by mouth every Monday, Wednesday, and Friday. 3 times a  week 12/24/18  Yes [provider]  CREON 36000 units CPEP capsule Take 01,601-09,323 Units by mouth See admin instructions. Take 2 capsules by mouth with meals and 1 capsule with snacks 11/16/19  Yes [provider]  diclofenac Sodium (VOLTAREN) 1 % GEL Apply 2 g topically daily as needed (for pain).  11/23/19  Yes [provider]  docusate sodium (COLACE) 100 MG capsule Take 100 mg by mouth daily.  01/01/19  Yes [provider]  EMGALITY 120 MG/ML SOAJ Inject 1 Dose into the muscle every 30 (thirty) days.  11/24/18  Yes [provider]  escitalopram (LEXAPRO) 20 MG tablet Take 20 mg by mouth daily.  01/22/19  Yes [provider]  FARXIGA 10 MG TABS tablet Take 10 mg by mouth daily.  01/07/19  Yes [provider]  fluticasone (FLONASE) 50 MCG/ACT nasal spray Place 1 spray into both nostrils daily.  01/20/19  Yes [provider]  GAVILAX 17 GM/SCOOP powder Take 17 g by mouth daily. 08/23/19  Yes [provider]  glipiZIDE (GLUCOTROL) 10 MG tablet Take 10 mg by mouth 2 (two) times daily. 01/19/19  Yes [provider]  Insulin Detemir (LEVEMIR FLEXTOUCH) 100 UNIT/ML Pen Inject 30 Units into the skin 2 (two) times daily. 30 units in the morning and 30 units at bedtime 03/31/19  Yes [provider]  lidocaine (XYLOCAINE) 4 % external solution Apply topically daily as needed for mild pain or moderate pain.  11/23/19  Yes [provider]  meclizine (ANTIVERT) 25 MG tablet Take 25 mg by mouth every 6 (six) hours as needed for dizziness or nausea.  10/01/19  Yes [provider]  metaxalone (SKELAXIN) 800 MG tablet Take 800 mg by mouth every 8 (eight) hours as needed for muscle spasms.  12/03/19  Yes [provider]  mirtazapine (REMERON) 30 MG tablet Take 30 mg by mouth at bedtime. 12/09/19  Yes [provider]  montelukast (SINGULAIR) 10 MG tablet Take 10 mg by mouth daily.  01/12/19   Yes [provider]  NARCAN 4 MG/0.1ML LIQD nasal spray kit Place 1 spray into the nose once.  09/11/19  Yes [provider]  nitroGLYCERIN (NITROSTAT) 0.4 MG SL tablet Place 0.4 mg under the tongue every 5 (five) minutes as needed for chest pain.  08/18/19  Yes [provider]  NOVOLOG FLEXPEN 100 UNIT/ML FlexPen Inject 15 Units into the skin 3 (three) times daily with meals.  04/27/19  Yes [provider]  omeprazole (PRILOSEC) 40 MG capsule Take 1 capsule (40 mg total) by mouth daily. 08/13/19  Yes Emokpae, Courage, MD  ondansetron (ZOFRAN-ODT) 8 MG disintegrating tablet Take 8 mg by mouth 2 (two) times daily as needed for nausea or vomiting.  10/20/19  Yes [provider]  prazosin (MINIPRESS) 2 MG capsule Take 1 capsule (2 mg total) by  mouth at bedtime. 08/13/19  Yes Emokpae, Courage, MD  pregabalin (LYRICA) 200 MG capsule Take 200 mg by mouth 2 (two) times daily.   Yes [provider]  propranolol (INDERAL) 40 MG tablet Take 40 mg by mouth 2 (two) times daily.  01/12/19  Yes [provider]  simvastatin (ZOCOR) 20 MG tablet Take 20 mg by mouth daily at 6 PM.  12/25/18  Yes [provider]  topiramate (TOPAMAX) 100 MG tablet Take 100 mg by mouth 2 (two) times daily. 05/18/19  Yes [provider]  XIIDRA 5 % SOLN Place 2 drops into both eyes daily.  01/21/19  Yes [provider]  famotidine (PEPCID) 20 MG tablet Take 20 mg by mouth 3 (three) times daily as needed. 11/16/19   [provider]    Physical Exam: Vitals:   12/16/19 1021 12/16/19 1124  BP: 121/83 111/85  Pulse: (!) 143 (!) 115  Resp: 16 16  Temp: 97.7 F (36.5 C)   TempSrc: Oral   SpO2: 96% 95%  Weight: 103.9 kg   Height: 6' (1.829 m)     Constitutional: NAD, calm, comfortable Eyes: PERRL, lids and conjunctivae normal ENMT: Mucous membranes are moist. Posterior pharynx clear of any exudate or lesions.Normal dentition.  Neck: normal,  supple, no masses, no thyromegaly Respiratory: clear to auscultation bilaterally, no wheezing, no crackles. Normal respiratory effort. No accessory muscle use.  Cardiovascular: Regular rate and rhythm, no murmurs / rubs / gallops. No extremity edema. 2+ pedal pulses. No carotid bruits.  Abdomen: Diffuse abdominal tenderness, no masses palpated. No hepatosplenomegaly. Bowel sounds positive.  Musculoskeletal: no clubbing / cyanosis. No joint deformity upper and lower extremities. Good ROM, no contractures. Normal muscle tone.  Skin: no rashes, lesions, ulcers. No induration Neurologic: CN 2-12 grossly intact. Sensation intact, DTR normal. Strength 5/5 in all 4.  Psychiatric: Normal judgment and insight. Alert and oriented x 3. Normal mood.    Labs on Admission: I have personally reviewed following labs and imaging studies  CBC: Recent Labs  Lab 12/16/19 1123  WBC 10.6*  NEUTROABS 7.9*  HGB 15.1  HCT 44.5  MCV 84.9  PLT 185   Basic Metabolic Panel: Recent Labs  Lab 12/16/19 1123  NA 134*  K 3.2*  CL 99  CO2 22  GLUCOSE 168*  BUN 14  CREATININE 0.90  CALCIUM 8.6*   GFR: Estimated Creatinine Clearance: 148.2 mL/min (by C-G formula based on SCr of 0.9 mg/dL). Liver Function Tests: Recent Labs  Lab 12/16/19 1123  AST 38  ALT 67*  ALKPHOS 104  BILITOT 1.5*  PROT 6.6  ALBUMIN 3.6   Recent Labs  Lab 12/16/19 1123  LIPASE 292*   No results for input(s): AMMONIA in the last 168 hours. Coagulation Profile: No results for input(s): INR, PROTIME in the last 168 hours. Cardiac Enzymes: No results for input(s): CKTOTAL, CKMB, CKMBINDEX, TROPONINI in the last 168 hours. BNP (last 3 results) No results for input(s): PROBNP in the last 8760 hours. HbA1C: No results for input(s): HGBA1C in the last 72 hours. CBG: Recent Labs  Lab 12/16/19 1624  GLUCAP 111*   Lipid Profile: No results for input(s): CHOL, HDL, LDLCALC, TRIG, CHOLHDL, LDLDIRECT in the last 72  hours. Thyroid Function Tests: No results for input(s): TSH, T4TOTAL, FREET4, T3FREE, THYROIDAB in the last 72 hours. Anemia Panel: No results for input(s): VITAMINB12, FOLATE, FERRITIN, TIBC, IRON, RETICCTPCT in the last 72 hours. Urine analysis:    Component Value Date/Time  COLORURINE YELLOW 08/12/2019 0050   APPEARANCEUR CLOUDY (A) 08/12/2019 0050   LABSPEC 1.018 08/12/2019 0050   PHURINE 7.0 08/12/2019 0050   GLUCOSEU >=500 (A) 08/12/2019 0050   HGBUR NEGATIVE 08/12/2019 0050   BILIRUBINUR NEGATIVE 08/12/2019 0050   KETONESUR NEGATIVE 08/12/2019 0050   PROTEINUR NEGATIVE 08/12/2019 0050   NITRITE NEGATIVE 08/12/2019 0050   LEUKOCYTESUR NEGATIVE 08/12/2019 0050    Radiological Exams on Admission: No results found.  Assessment/Plan Active Problems:   Acute on chronic/recurrent pancreatitis   Bipolar 1 disorder (HCC)   Type 2 diabetes mellitus without complication (HCC)   GERD (gastroesophageal reflux disease)   HTN (hypertension)   Hypokalemia   Acute on chronic pancreatitis ([place])    1. Acute on chronic recurrent pancreatitis.  Patient denies any recent alcohol use.  She is status post cholecystectomy.  We will try and obtain recent CT abdomen pelvis done at [facility].  Continue supportive measures with bowel rest, IV fluids and pain management. 2. Diabetes.  Continue on reduced dose Lantus.  Will hold NovoLog since he is n.p.o.  Check A1c.  Continue on sliding scale with blood sugar checks every 4 hours. 3. GERD.  Continue Protonix 4. Hypokalemia.  Related to vomiting.  Replace 5. Bipolar 1 disorder/anxiety.  Continue on Abilify, Remeron, Lexapro  DVT prophylaxis: Lovenox Code Status: Full code Family Communication: Discussed with patient Disposition Plan: Discharge home once abdominal pain has improved Consults called:   Admission status: Observation, MedSurg  Kathie Dike MD [facility]   If 7PM-7AM, please contact  night-coverage [email or web address]   12/16/2019, 8:45 PM

## 2019-12-16 NOTE — ED Triage Notes (Signed)
Pt states that he has chronic panceaitis and he went to [facility] couple of days ago and was told to follow up with his doctor. He is hurting in abd up into his chest.

## 2019-12-16 NOTE — ED Provider Notes (Signed)
[facility] EMERGENCY DEPARTMENT Provider Note   CSN: [id number] Arrival date & time: 12/16/19  1008     History Chief Complaint  Patient presents with  . Chest Pain  . Abdominal Pain    Dennis Yang is a 34 y.o. male.  HPI     This is a 34 year old male with a history of chronic pancreatitis, diabetes, bipolar disorder who presents with abdominal pain.  Patient reports over the last week he has had worsening epigastric pain.  It is nonradiating.  He reports associated nausea and vomiting.  He also reports diarrhea.  He was seen at [facility] and given Norco.  He states that the Norco is not helping his pain and he feels like his symptoms are getting worse.  He does report getting CT imaging at that time.  He is not had any fevers, chest pain, shortness of breath.  No known sick contacts.  Currently he rates his pain at 10 out of 10.  He reports decreased appetite.  Past Medical History:  Diagnosis Date  . Anxiety   . Back pain   . Bipolar 1 disorder (HCC)   . Depression   . Diabetes mellitus without complication (HCC)   . Incontinence of bowel   . Incontinence of urine   . Neck pain   . Neuropathy   . Osteoarthritis   . Osteonecrosis (HCC)    left foot  . Pancreatitis   . Schizoaffective disorder (HCC)   . TIA (transient ischemic attack)     Patient Active Problem List   Diagnosis Date Noted  . Acute on chronic/recurrent pancreatitis 08/12/2019  . Acute on chronic/recurrent pancreatitis 01/27/2019  . Bipolar 1 disorder (HCC) 01/27/2019  . Depression with anxiety 01/27/2019  . Type 2 diabetes mellitus without complication (HCC) 01/27/2019    Past Surgical History:  Procedure Laterality Date  . botox injections in bladder    . CHOLECYSTECTOMY    . ELBOW FRACTURE SURGERY    . FOOT SURGERY    . HERNIA REPAIR    . LITHOTRIPSY    . NASAL SINUS SURGERY         No family history on file.  Social History   Tobacco Use  . Smoking status: Former Games developer  .  Smokeless tobacco: Never Used  Substance Use Topics  . Alcohol use: Not Currently  . Drug use: Not Currently    Home Medications Prior to Admission medications   Medication Sig Start Date End Date Taking? Authorizing Provider  acetaminophen (TYLENOL) 325 MG tablet Take 2 tablets (650 mg total) by mouth every 6 (six) hours as needed for mild pain, fever or headache (or Fever >/= 101). 08/13/19   Emokpae, Courage, MD  ARIPiprazole (ABILIFY) 15 MG tablet Take 15 mg by mouth daily.    [provider]  ATROVENT HFA 17 MCG/ACT inhaler Inhale 1 puff into the lungs every 4 (four) hours as needed for wheezing.  01/16/19   [provider]  CARAFATE 1 g tablet Take 1 g by mouth 2 (two) times daily.  12/20/18   [provider]  celecoxib (CELEBREX) 100 MG capsule Take 1 capsule (100 mg total) by mouth daily with breakfast. 08/13/19   Shon Hale, MD  Cholecalciferol (VITAMIN D3) 1.25 MG (50000 UT) CAPS Take 1 capsule by mouth every Monday, Wednesday, and Friday. 3 times a week 12/24/18   [provider]  docusate sodium (COLACE) 100 MG capsule Take 100 mg by mouth daily.  01/01/19   [provider]  EMGALITY 120 MG/ML SOAJ Inject 1 Dose into the muscle every 30 (thirty) days.  11/24/18   [provider]  escitalopram (LEXAPRO) 20 MG tablet Take 20 mg by mouth daily.  01/22/19   [provider]  FARXIGA 10 MG TABS tablet Take 10 mg by mouth daily.  01/07/19   [provider]  fluticasone (FLONASE) 50 MCG/ACT nasal spray Place 1 spray into both nostrils daily.  01/20/19   [provider]  glipiZIDE (GLUCOTROL) 10 MG tablet Take 10 mg by mouth 2 (two) times daily. 01/19/19   [provider]  hydrOXYzine (ATARAX/VISTARIL) 50 MG tablet Take 50 mg by mouth 4 (four) times daily. 01/22/19   [provider]  Insulin Detemir (LEVEMIR FLEXTOUCH) 100 UNIT/ML Pen Inject 10-30 Units into the skin See admin instructions. 30 units in  the morning and 30 units at bedtime 03/31/19   [provider]  levocetirizine (XYZAL) 5 MG tablet Take 5 mg by mouth every evening.  01/15/19   [provider]  montelukast (SINGULAIR) 10 MG tablet Take 10 mg by mouth daily.  01/12/19   [provider]  NOVOLOG FLEXPEN 100 UNIT/ML FlexPen Inject 15 Units into the skin 3 (three) times daily with meals.  04/27/19   [provider]  omeprazole (PRILOSEC) 40 MG capsule Take 1 capsule (40 mg total) by mouth daily. 08/13/19   Shon Hale, MD  oxybutynin (DITROPAN) 5 MG tablet Take 1 mg by mouth 3 (three) times daily. 03/31/19   [provider]  prazosin (MINIPRESS) 2 MG capsule Take 1 capsule (2 mg total) by mouth at bedtime. 08/13/19   Shon Hale, MD  pregabalin (LYRICA) 200 MG capsule Take 200 mg by mouth 2 (two) times daily.    [provider]  propranolol (INDERAL) 40 MG tablet Take 40 mg by mouth 2 (two) times daily.  01/12/19   [provider]  simvastatin (ZOCOR) 20 MG tablet Take 20 mg by mouth daily at 6 PM.  12/25/18   [provider]  topiramate (TOPAMAX) 100 MG tablet Take 100 mg by mouth 2 (two) times daily. 05/18/19   [provider]  XIIDRA 5 % SOLN Place 2 drops into both eyes daily.  01/21/19   [provider]    Allergies    Ciprofloxacin  Review of Systems   Review of Systems  Constitutional: Negative for fever.  Respiratory: Negative for shortness of breath.   Cardiovascular: Negative for chest pain.  Gastrointestinal: Positive for abdominal pain, diarrhea, nausea and vomiting.  Genitourinary: Negative for dysuria.  All other systems reviewed and are negative.   Physical Exam Updated Vital Signs BP 111/85 (BP Location: Left Arm)   Pulse (!) 115   Temp 97.7 F (36.5 C) (Oral)   Resp 16   Ht 1.829 m (6')   Wt 103.9 kg   SpO2 95%   BMI 31.06 kg/m   Physical Exam Vitals and nursing note reviewed.  Constitutional:      Appearance:  He is well-developed. He is not ill-appearing.     Comments: Overweight, non-ill-appearing  HENT:     Head: Normocephalic and atraumatic.  Eyes:     Pupils: Pupils are equal, round, and reactive to light.  Cardiovascular:     Rate and Rhythm: Regular rhythm. Tachycardia present.     Heart sounds: Normal heart sounds. No murmur.  Pulmonary:     Effort: Pulmonary effort is normal. No respiratory distress.  Breath sounds: Normal breath sounds. No wheezing.  Abdominal:     General: Bowel sounds are normal.     Palpations: Abdomen is soft.     Tenderness: There is abdominal tenderness. There is no guarding or rebound.     Comments: Diffuse upper abdominal tenderness to palpation, no rebound or guarding  Musculoskeletal:     Cervical back: Neck supple.  Lymphadenopathy:     Cervical: No cervical adenopathy.  Skin:    General: Skin is warm and dry.  Neurological:     Mental Status: He is alert and oriented to person, place, and time.  Psychiatric:        Mood and Affect: Mood normal.     ED Results / Procedures / Treatments   Labs (all labs ordered are listed, but only abnormal results are displayed) Labs Reviewed  CBC WITH DIFFERENTIAL/PLATELET - Abnormal; Notable for the following components:      Result Value   WBC 10.6 (*)    Neutro Abs 7.9 (*)    All other components within normal limits  COMPREHENSIVE METABOLIC PANEL - Abnormal; Notable for the following components:   Sodium 134 (*)    Potassium 3.2 (*)    Glucose, Bld 168 (*)    Calcium 8.6 (*)    ALT 67 (*)    Total Bilirubin 1.5 (*)    All other components within normal limits  LIPASE, BLOOD - Abnormal; Notable for the following components:   Lipase 292 (*)    All other components within normal limits  SARS CORONAVIRUS 2 (TAT 6-24 HRS)    EKG None  Radiology No results found.  Procedures Procedures (including critical care time)  Medications Ordered in ED Medications  HYDROmorphone (DILAUDID)  injection 1 mg (has no administration in time range)  0.9 %  sodium chloride infusion (has no administration in time range)  potassium chloride 10 mEq in 100 mL IVPB (has no administration in time range)  sodium chloride 0.9 % bolus 1,000 mL (1,000 mLs Intravenous New Bag/Given 12/16/19 1127)  ondansetron (ZOFRAN) injection 4 mg (4 mg Intravenous Given 12/16/19 1118)  morphine 4 MG/ML injection 4 mg (4 mg Intravenous Given 12/16/19 1118)    ED Course  I have reviewed the triage vital signs and the nursing notes.  Pertinent labs & imaging results that were available during my care of the patient were reviewed by me and considered in my medical decision making (see chart for details).  Clinical Course as of Dec 15 1349  Wed Dec 16, 2019  1250 On recheck continues to report significant pain.  No signs of peritonitis.  Records requested from [place].  Does have an elevated lipase.  Heart rate down to 115.  Will start on maintenance fluids.  Give an additional dose of pain medication.   [CH]    Clinical Course User Index [CH] Milika Ventress, Barbette Hair, MD   MDM Rules/Calculators/A&P                       Patient presents with continued abdominal pain.  Reports recent evaluation at [facility] and diagnosed with chronic pancreatitis.  Was given Norco but is refractory with pain at home.  Initially his vital signs notable for heart rate in the 140s.  He is generally nontoxic appearing and his other vital signs are reassuring.  He is afebrile.  He has tenderness on exam without signs of peritonitis.  Unfortunately I cannot see his outside records.  I have  requested records from [place].  Patient was given pain and nausea medication as well as fluids.  Lipase is elevated in the 200s.  Given his history of chronic pancreatitis, this is likely significant as he has had normal lipase is in the past.  He has required several doses of medication.  He is also mildly hypokalemic.  This was replaced by IV.  On multiple  rechecks he continues to endorse pain.  Would like to avoid rescanning him.  Still awaiting outside records and CT scan.  However, I do not feel that this changes his disposition.  Will discuss with hospitalist for admission for pain control.  Final Clinical Impression(s) / ED Diagnoses Final diagnoses:  Other chronic pancreatitis (HCC)  Epigastric pain  Hypokalemia  Dehydration    Rx / DC Orders ED Discharge Orders    None       Shon Baton, MD 12/16/19 1352

## 2019-12-17 DIAGNOSIS — E876 Hypokalemia: Secondary | ICD-10-CM | POA: Diagnosis present

## 2019-12-17 DIAGNOSIS — K8681 Exocrine pancreatic insufficiency: Secondary | ICD-10-CM | POA: Diagnosis present

## 2019-12-17 DIAGNOSIS — I10 Essential (primary) hypertension: Secondary | ICD-10-CM | POA: Diagnosis present

## 2019-12-17 DIAGNOSIS — E1140 Type 2 diabetes mellitus with diabetic neuropathy, unspecified: Secondary | ICD-10-CM | POA: Diagnosis present

## 2019-12-17 DIAGNOSIS — Z20822 Contact with and (suspected) exposure to covid-19: Secondary | ICD-10-CM | POA: Diagnosis present

## 2019-12-17 DIAGNOSIS — F419 Anxiety disorder, unspecified: Secondary | ICD-10-CM | POA: Diagnosis present

## 2019-12-17 DIAGNOSIS — Z79899 Other long term (current) drug therapy: Secondary | ICD-10-CM | POA: Diagnosis not present

## 2019-12-17 DIAGNOSIS — Z87891 Personal history of nicotine dependence: Secondary | ICD-10-CM | POA: Diagnosis not present

## 2019-12-17 DIAGNOSIS — E860 Dehydration: Secondary | ICD-10-CM | POA: Diagnosis present

## 2019-12-17 DIAGNOSIS — K859 Acute pancreatitis without necrosis or infection, unspecified: Secondary | ICD-10-CM | POA: Diagnosis not present

## 2019-12-17 DIAGNOSIS — R300 Dysuria: Secondary | ICD-10-CM | POA: Diagnosis not present

## 2019-12-17 DIAGNOSIS — Z881 Allergy status to other antibiotic agents status: Secondary | ICD-10-CM | POA: Diagnosis not present

## 2019-12-17 DIAGNOSIS — Z6831 Body mass index (BMI) 31.0-31.9, adult: Secondary | ICD-10-CM | POA: Diagnosis not present

## 2019-12-17 DIAGNOSIS — R1013 Epigastric pain: Secondary | ICD-10-CM

## 2019-12-17 DIAGNOSIS — Z794 Long term (current) use of insulin: Secondary | ICD-10-CM | POA: Diagnosis not present

## 2019-12-17 DIAGNOSIS — F319 Bipolar disorder, unspecified: Secondary | ICD-10-CM | POA: Diagnosis present

## 2019-12-17 DIAGNOSIS — Z8673 Personal history of transient ischemic attack (TIA), and cerebral infarction without residual deficits: Secondary | ICD-10-CM | POA: Diagnosis not present

## 2019-12-17 DIAGNOSIS — E663 Overweight: Secondary | ICD-10-CM | POA: Diagnosis present

## 2019-12-17 DIAGNOSIS — K861 Other chronic pancreatitis: Secondary | ICD-10-CM | POA: Diagnosis present

## 2019-12-17 DIAGNOSIS — Z9049 Acquired absence of other specified parts of digestive tract: Secondary | ICD-10-CM | POA: Diagnosis not present

## 2019-12-17 DIAGNOSIS — F259 Schizoaffective disorder, unspecified: Secondary | ICD-10-CM | POA: Diagnosis present

## 2019-12-17 DIAGNOSIS — K219 Gastro-esophageal reflux disease without esophagitis: Secondary | ICD-10-CM | POA: Diagnosis present

## 2019-12-17 LAB — COMPREHENSIVE METABOLIC PANEL
ALT: 71 U/L — ABNORMAL HIGH (ref 0–44)
AST: 58 U/L — ABNORMAL HIGH (ref 15–41)
Albumin: 3.1 g/dL — ABNORMAL LOW (ref 3.5–5.0)
Alkaline Phosphatase: 87 U/L (ref 38–126)
Anion gap: 9 (ref 5–15)
BUN: 12 mg/dL (ref 6–20)
CO2: 21 mmol/L — ABNORMAL LOW (ref 22–32)
Calcium: 8.3 mg/dL — ABNORMAL LOW (ref 8.9–10.3)
Chloride: 106 mmol/L (ref 98–111)
Creatinine, Ser: 0.87 mg/dL (ref 0.61–1.24)
GFR calc Af Amer: >60 mL/min (ref >60)
GFR calc non Af Amer: >60 mL/min (ref >60)
Glucose, Bld: 135 mg/dL — ABNORMAL HIGH (ref 70–99)
Potassium: 3.7 mmol/L (ref 3.5–5.1)
Sodium: 136 mmol/L (ref 135–145)
Total Bilirubin: 1.3 mg/dL — ABNORMAL HIGH (ref 0.3–1.2)
Total Protein: 5.7 g/dL — ABNORMAL LOW (ref 6.5–8.1)

## 2019-12-17 LAB — GLUCOSE, CAPILLARY
Glucose-Capillary: 109 mg/dL — ABNORMAL HIGH (ref 70–99)
Glucose-Capillary: 111 mg/dL — ABNORMAL HIGH (ref 70–99)
Glucose-Capillary: 113 mg/dL — ABNORMAL HIGH (ref 70–99)
Glucose-Capillary: 116 mg/dL — ABNORMAL HIGH (ref 70–99)
Glucose-Capillary: 123 mg/dL — ABNORMAL HIGH (ref 70–99)
Glucose-Capillary: 169 mg/dL — ABNORMAL HIGH (ref 70–99)

## 2019-12-17 LAB — CBC
HCT: 36.3 % — ABNORMAL LOW (ref 39.0–52.0)
Hemoglobin: 11.8 g/dL — ABNORMAL LOW (ref 13.0–17.0)
MCH: 29.1 pg (ref 26.0–34.0)
MCHC: 32.5 g/dL (ref 30.0–36.0)
MCV: 89.6 fL (ref 80.0–100.0)
Platelets: 161 10*3/uL (ref 150–400)
RBC: 4.05 MIL/uL — ABNORMAL LOW (ref 4.22–5.81)
RDW: 13.5 % (ref 11.5–15.5)
WBC: 7.8 10*3/uL (ref 4.0–10.5)
nRBC: 0 % (ref 0.0–0.2)

## 2019-12-17 LAB — HEMOGLOBIN A1C
Hgb A1c MFr Bld: 6.9 % — ABNORMAL HIGH (ref 4.8–5.6)
Mean Plasma Glucose: 151.33 mg/dL

## 2019-12-17 LAB — SARS CORONAVIRUS 2 (TAT 6-24 HRS): SARS Coronavirus 2: NEGATIVE

## 2019-12-17 LAB — LIPASE, BLOOD: Lipase: 100 U/L — ABNORMAL HIGH (ref 11–51)

## 2019-12-17 MED ORDER — SODIUM CHLORIDE 0.9 % IV SOLN: INTRAVENOUS | Status: DC

## 2019-12-17 NOTE — Progress Notes (Signed)
PROGRESS NOTE    Dennis Yang  MBW:[id number] DOB: 1986-09-25 DOA: 12/16/2019 PCP: Tacy Learn, FNP    Brief Narrative:   Dennis Yang is a 34 y.o. male with medical history significant of chronic pancreatitis, history of alcohol abuse in the past but none recently, diabetes, bipolar disorder, chronic back pain, history of cholecystectomy in the past, presents to the hospital with complaints of abdominal pain.  Reports onset of symptoms occurring on Sunday evening.  By Monday morning, he had worsening abdominal pain in the epigastrium, rating up into his chest as well as down into his flanks.  He had associated nausea and vomiting.  He reports having loose stools, but this is a chronic issue from his pancreatitis.  He had conjunctival regional for evaluation and had a CT scan done.  Report is unavailable at this time.  He reports been given Norco and discharged home.  He feels that the Norco will not adequately treat his pain.  He returned the following day and was told that he would have to wait in the emergency room.  Patient was not increasing pain and therefore came to [facility] emergency room for evaluation.   Assessment & Plan:   Active Problems:   Acute on chronic/recurrent pancreatitis   Bipolar 1 disorder (HCC)   Type 2 diabetes mellitus without complication (HCC)   GERD (gastroesophageal reflux disease)   HTN (hypertension)   Hypokalemia   Acute on chronic pancreatitis (HCC)   1. Acute on chronic recurrent pancreatitis.  Patient denies any recent alcohol use.  He is status post cholecystectomy.  We will try and obtain recent CT abdomen pelvis [facility] at [facility].  Continue supportive measures with bowel rest, IV fluids and pain management.  Since he is continuing to have significant pain which is unchanged from yesterday, will continue n.p.o. status.  Overall lipase is trending down. 2. Diabetes.  Continue on reduced dose Lantus.  Will hold NovoLog since he is n.p.o.    A1c 6.9.  Continue on sliding scale with blood sugar checks every 4 hours. 3. GERD.  Continue Protonix 4. Hypokalemia.  Related to vomiting.  Replaced 5. Bipolar 1 disorder/anxiety.  Continue on Abilify, Remeron, Lexapro   DVT prophylaxis: Lovenox Code Status: Full code Family Communication: Discussed with patient Disposition Plan: Discharge home once abdominal pain is improved   Consultants:     Procedures:     Antimicrobials:       Subjective: Continues to have abdominal pain.  No vomiting, but continues have nausea.  Has had 2 loose stools today.  Objective: Vitals:   12/16/19 2222 12/16/19 2230 12/17/19 0555 12/17/19 1818  BP:  116/77 102/76 103/63  Pulse:  (!) 101 92 90  Resp:  20 16 16   Temp:  99.2 F (37.3 C) 97.6 F (36.4 C) 98.6 F (37 C)  TempSrc:  Oral Oral Oral  SpO2:  95% 95% 95%  Weight: 102.9 kg     Height: 6' (1.829 m)       Intake/Output Summary (Last 24 hours) at 12/17/2019 2045 Last data filed at 12/17/2019 1303 Gross per 24 hour  Intake 1317.61 ml  Output 1800 ml  Net -482.39 ml   Filed Weights   12/16/19 1021 12/16/19 2222  Weight: 103.9 kg 102.9 kg    Examination:  General exam: Appears calm and comfortable  Respiratory system: Clear to auscultation. Respiratory effort normal. Cardiovascular system: S1 & S2 heard, RRR. No JVD, murmurs, rubs, gallops or clicks. No pedal edema. Gastrointestinal  system: Abdomen is nondistended, soft and diffusely tender. No organomegaly or masses felt. Normal bowel sounds heard. Central nervous system: Alert and oriented. No focal neurological deficits. Extremities: Symmetric 5 x 5 power. Skin: No rashes, lesions or ulcers Psychiatry: Judgement and insight appear normal. Mood & affect appropriate.     Data Reviewed: I have personally reviewed following labs and imaging studies  CBC: Recent Labs  Lab 12/16/19 1123 12/17/19 0636  WBC 10.6* 7.8  NEUTROABS 7.9*  --   HGB 15.1 11.8*  HCT 44.5  36.3*  MCV 84.9 89.6  PLT 197 967   Basic Metabolic Panel: Recent Labs  Lab 12/16/19 1123 12/17/19 0636  NA 134* 136  K 3.2* 3.7  CL 99 106  CO2 22 21*  GLUCOSE 168* 135*  BUN 14 12  CREATININE 0.90 0.87  CALCIUM 8.6* 8.3*   GFR: Estimated Creatinine Clearance: 152.6 mL/min (by C-G formula based on SCr of 0.87 mg/dL). Liver Function Tests: Recent Labs  Lab 12/16/19 1123 12/17/19 0636  AST 38 58*  ALT 67* 71*  ALKPHOS 104 87  BILITOT 1.5* 1.3*  PROT 6.6 5.7*  ALBUMIN 3.6 3.1*   Recent Labs  Lab 12/16/19 1123 12/17/19 0636  LIPASE 292* 100*   No results for input(s): AMMONIA in the last 168 hours. Coagulation Profile: No results for input(s): INR, PROTIME in the last 168 hours. Cardiac Enzymes: No results for input(s): CKTOTAL, CKMB, CKMBINDEX, TROPONINI in the last 168 hours. BNP (last 3 results) No results for input(s): PROBNP in the last 8760 hours. HbA1C: Recent Labs    12/16/19 1123  HGBA1C 6.9*   CBG: Recent Labs  Lab 12/17/19 0000 12/17/19 0340 12/17/19 0755 12/17/19 1132 12/17/19 1710  GLUCAP 116* 169* 109* 111* 113*   Lipid Profile: No results for input(s): CHOL, HDL, LDLCALC, TRIG, CHOLHDL, LDLDIRECT in the last 72 hours. Thyroid Function Tests: No results for input(s): TSH, T4TOTAL, FREET4, T3FREE, THYROIDAB in the last 72 hours. Anemia Panel: No results for input(s): VITAMINB12, FOLATE, FERRITIN, TIBC, IRON, RETICCTPCT in the last 72 hours. Sepsis Labs: No results for input(s): PROCALCITON, LATICACIDVEN in the last 168 hours.  Recent Results (from the past 240 hour(s))  SARS CORONAVIRUS 2 (TAT 6-24 HRS) Nasopharyngeal Nasopharyngeal Swab     Status: None   Collection Time: 12/16/19  1:50 PM   Specimen: Nasopharyngeal Swab  Result Value Ref Range Status   SARS Coronavirus 2 NEGATIVE NEGATIVE Final    Comment: (NOTE) SARS-CoV-2 target nucleic acids are NOT DETECTED. The SARS-CoV-2 RNA is generally detectable in upper and  lower respiratory specimens during the acute phase of infection. Negative results do not preclude SARS-CoV-2 infection, do not rule out co-infections with other pathogens, and should not be used as the sole basis for treatment or other patient management decisions. Negative results must be combined with clinical observations, patient history, and epidemiological information. The expected result is Negative. Fact Sheet for Patients: SugarRoll.be Fact Sheet for Healthcare Providers: https://www.woods-mathews.com/ This test is not yet approved or cleared by the [place] FDA and  has been authorized for detection and/or diagnosis of SARS-CoV-2 by FDA under an Emergency Use Authorization (EUA). This EUA will remain  in effect (meaning this test can be used) for the duration of the COVID-19 declaration under Section 56 4(b)(1) of the Act, 21 U.S.C. section 360bbb-3(b)(1), unless the authorization is terminated or revoked sooner. Performed at [facility], [place] [address], [place], [place] [address]  Radiology Studies: No results found.      Scheduled Meds:  ARIPiprazole  15 mg Oral Daily   enoxaparin (LOVENOX) injection  40 mg Subcutaneous Q24H   escitalopram  20 mg Oral Daily   insulin aspart  0-9 Units Subcutaneous Q4H   insulin detemir  15 Units Subcutaneous BID   Lifitegrast  2 drop Both Eyes Daily   mirtazapine  30 mg Oral QHS   montelukast  10 mg Oral Daily   pantoprazole (PROTONIX) IV  40 mg Intravenous Q24H   prazosin  2 mg Oral QHS   pregabalin  200 mg Oral BID   propranolol  40 mg Oral BID   simvastatin  20 mg Oral q1800   topiramate  100 mg Oral BID   Continuous Infusions:  sodium chloride 125 mL/hr at 12/17/19 2032     LOS: 0 days    Time spent:    Erick Blinks, MD [facility]   If 7PM-7AM, please contact night-coverage [email or web address]  12/17/2019, 8:45 PM

## 2019-12-17 NOTE — Progress Notes (Signed)
Patient has had two loose stools today. Enteric precautions initiated and patient education completed. Notified Dr. Kerry Hough. States to monitor and notify MD if he has 4 loose stools or more. Will monitor and pass along to night shift RN as well. Earnstine Regal, RN

## 2019-12-18 LAB — COMPREHENSIVE METABOLIC PANEL
ALT: 57 U/L — ABNORMAL HIGH (ref 0–44)
AST: 38 U/L (ref 15–41)
Albumin: 3.2 g/dL — ABNORMAL LOW (ref 3.5–5.0)
Alkaline Phosphatase: 97 U/L (ref 38–126)
Anion gap: 6 (ref 5–15)
BUN: 8 mg/dL (ref 6–20)
CO2: 21 mmol/L — ABNORMAL LOW (ref 22–32)
Calcium: 8.5 mg/dL — ABNORMAL LOW (ref 8.9–10.3)
Chloride: 111 mmol/L (ref 98–111)
Creatinine, Ser: 0.86 mg/dL (ref 0.61–1.24)
GFR calc Af Amer: >60 mL/min (ref >60)
GFR calc non Af Amer: >60 mL/min (ref >60)
Glucose, Bld: 106 mg/dL — ABNORMAL HIGH (ref 70–99)
Potassium: 3.6 mmol/L (ref 3.5–5.1)
Sodium: 138 mmol/L (ref 135–145)
Total Bilirubin: 0.6 mg/dL (ref 0.3–1.2)
Total Protein: 6.1 g/dL — ABNORMAL LOW (ref 6.5–8.1)

## 2019-12-18 LAB — GLUCOSE, CAPILLARY
Glucose-Capillary: 111 mg/dL — ABNORMAL HIGH (ref 70–99)
Glucose-Capillary: 111 mg/dL — ABNORMAL HIGH (ref 70–99)
Glucose-Capillary: 105 mg/dL — ABNORMAL HIGH (ref 70–99)
Glucose-Capillary: 112 mg/dL — ABNORMAL HIGH (ref 70–99)
Glucose-Capillary: 128 mg/dL — ABNORMAL HIGH (ref 70–99)

## 2019-12-18 LAB — LIPASE, BLOOD: Lipase: 114 U/L — ABNORMAL HIGH (ref 11–51)

## 2019-12-18 MED ORDER — SODIUM CHLORIDE 0.9 % IV SOLN
1.0000 g | INTRAVENOUS | Status: DC
  Administered 2019-12-18: 18:00:00 1 g via INTRAVENOUS
  Filled 2019-12-18: qty 10

## 2019-12-18 NOTE — Progress Notes (Signed)
PROGRESS NOTE    Artemis Loyal  HFW:[id number] DOB: 05-Jun-1986 DOA: 12/16/2019 PCP: Beola Cord, FNP    Brief Narrative:   Dennis Yang is a 34 y.o. male with medical history significant of chronic pancreatitis, history of alcohol abuse in the past but none recently, diabetes, bipolar disorder, chronic back pain, history of cholecystectomy in the past, presents to the hospital with complaints of abdominal pain.  Reports onset of symptoms occurring on Sunday evening.  By Monday morning, he had worsening abdominal pain in the epigastrium, rating up into his chest as well as down into his flanks.  He had associated nausea and vomiting.  He reports having loose stools, but this is a chronic issue from his pancreatitis. He reports been given Norco and discharged home.  He feels that the Norco will not adequately treat his pain.  He returned the following day and was told that he would have to wait in the emergency room.  Due to worsening pain, he presented to the [facility] ED.   Assessment & Plan:   Active Problems:   Acute on chronic/recurrent pancreatitis   Bipolar 1 disorder (HCC)   Type 2 diabetes mellitus without complication (HCC)   GERD (gastroesophageal reflux disease)   HTN (hypertension)   Hypokalemia   Acute on chronic pancreatitis ([place])   1. Acute on chronic recurrent pancreatitis.  Patient denies any recent alcohol use.  He is status post cholecystectomy. No new medications. Per patient, CT A/P was done at [facility]. I reviewed records scanned under media tab from [facility] and there is no mention of any imaging.  Continue supportive measures with bowel rest, IV fluids and pain management. Lipase largely stable. Abdominal pain not improved but reports feeling hungry. Advance to clear liquid diet.  2. Dysuria : Unclear if he has UTI, ?prostatitis. Will get UA, urine culture. Will get urine GC/chlamydia. Given dysuria, ?CVA tenderness, suprapubic discomfort, abdominal  pain not improving- will empirically treat with ceftriaxone for now.  3. Abdominal pain: No CT A/P results found on OSH records. Most likely acute pancreatitis. But am not sure if UTI/prostatitis? is contributing. Management as above.  4. Diabetes mellitus.  Continue on reduced dose Lantus.  Will hold NovoLog since just starting clear liquids.   A1c 6.9.  Continue on sliding scale with blood sugar checks every 4 hours. 5. GERD.  Continue Protonix 6. Hypokalemia.  Related to vomiting.  Replace as needed.  7. Bipolar 1 disorder/anxiety.  Continue on Abilify, Remeron, Lexapro   DVT prophylaxis: Lovenox Code Status: Full code Family Communication: Discussed with patient Disposition Plan: Discharge home once abdominal pain is improved   Consultants:   None   Procedures:   None   Antimicrobials:    Ceftriaxone     Subjective: Continues to have abdominal pain (epigastric, suprapubic).  Does not feel better than yesterday. Has 2 loose  He would like try to eat something. Agrees with clear liquid diet. Complains of dysuria that started 1/4. Sexually active 2 weeks ago. Uses condoms.   Objective: Vitals:   12/17/19 0555 12/17/19 1818 12/18/19 0000 12/18/19 0500  BP: 102/76 103/63 109/63 101/67  Pulse: 92 90 93 75  Resp: 16 16 20 20   Temp: 97.6 F (36.4 C) 98.6 F (37 C) 98.7 F (37.1 C) 98 F (36.7 C)  TempSrc: Oral Oral Oral   SpO2: 95% 95% 93% 95%  Weight:      Height:        Intake/Output Summary (Last 24  hours) at 12/18/2019 0752 Last data filed at 12/17/2019 1303 Gross per 24 hour  Intake 0 ml  Output 900 ml  Net -900 ml   Filed Weights   12/16/19 1021 12/16/19 2222  Weight: 103.9 kg 102.9 kg    Examination:  General exam: sleeping lateral position, appears anxious, not in acute distress  Respiratory system: Clear to auscultation. Respiratory effort normal. Cardiovascular system: S1 & S2 heard, RRR. No JVD, murmurs, rubs, gallops or clicks. No pedal edema.  Gastrointestinal system: Abdomen is nondistended, soft and diffusely tender (predominantly epigastrium, supra pubic).  Normal bowel sounds heard. ?L CVA tenderness.  Central nervous system: Alert and oriented. No focal neurological deficits. Extremities: Symmetric 5 x 5 power. Skin: No rashes, lesions or ulcers Psychiatry: anxious   Data Reviewed: I have personally reviewed following labs and imaging studies  CBC: Recent Labs  Lab 12/16/19 1123 12/17/19 0636  WBC 10.6* 7.8  NEUTROABS 7.9*  --   HGB 15.1 11.8*  HCT 44.5 36.3*  MCV 84.9 89.6  PLT 197 161   Basic Metabolic Panel: Recent Labs  Lab 12/16/19 1123 12/17/19 0636  NA 134* 136  K 3.2* 3.7  CL 99 106  CO2 22 21*  GLUCOSE 168* 135*  BUN 14 12  CREATININE 0.90 0.87  CALCIUM 8.6* 8.3*   GFR: Estimated Creatinine Clearance: 152.6 mL/min (by C-G formula based on SCr of 0.87 mg/dL). Liver Function Tests: Recent Labs  Lab 12/16/19 1123 12/17/19 0636  AST 38 58*  ALT 67* 71*  ALKPHOS 104 87  BILITOT 1.5* 1.3*  PROT 6.6 5.7*  ALBUMIN 3.6 3.1*   Recent Labs  Lab 12/16/19 1123 12/17/19 0636  LIPASE 292* 100*   No results for input(s): AMMONIA in the last 168 hours. Coagulation Profile: No results for input(s): INR, PROTIME in the last 168 hours. Cardiac Enzymes: No results for input(s): CKTOTAL, CKMB, CKMBINDEX, TROPONINI in the last 168 hours. BNP (last 3 results) No results for input(s): PROBNP in the last 8760 hours. HbA1C: Recent Labs    12/16/19 1123  HGBA1C 6.9*   CBG: Recent Labs  Lab 12/17/19 0755 12/17/19 1132 12/17/19 1710 12/17/19 2324 12/18/19 0516  GLUCAP 109* 111* 113* 123* 111*   Lipid Profile: No results for input(s): CHOL, HDL, LDLCALC, TRIG, CHOLHDL, LDLDIRECT in the last 72 hours. Thyroid Function Tests: No results for input(s): TSH, T4TOTAL, FREET4, T3FREE, THYROIDAB in the last 72 hours. Anemia Panel: No results for input(s): VITAMINB12, FOLATE, FERRITIN, TIBC, IRON,  RETICCTPCT in the last 72 hours. Sepsis Labs: No results for input(s): PROCALCITON, LATICACIDVEN in the last 168 hours.  Recent Results (from the past 240 hour(s))  SARS CORONAVIRUS 2 (TAT 6-24 HRS) Nasopharyngeal Nasopharyngeal Swab     Status: None   Collection Time: 12/16/19  1:50 PM   Specimen: Nasopharyngeal Swab  Result Value Ref Range Status   SARS Coronavirus 2 NEGATIVE NEGATIVE Final    Comment: (NOTE) SARS-CoV-2 target nucleic acids are NOT DETECTED. The SARS-CoV-2 RNA is generally detectable in upper and lower respiratory specimens during the acute phase of infection. Negative results do not preclude SARS-CoV-2 infection, do not rule out co-infections with other pathogens, and should not be used as the sole basis for treatment or other patient management decisions. Negative results must be combined with clinical observations, patient history, and epidemiological information. The expected result is Negative. Fact Sheet for Patients: HairSlick.no Fact Sheet for Healthcare Providers: quierodirigir.com This test is not yet approved or cleared by the [place]  FDA and  has been authorized for detection and/or diagnosis of SARS-CoV-2 by FDA under an Emergency Use Authorization (EUA). This EUA will remain  in effect (meaning this test can be used) for the duration of the COVID-19 declaration under Section 56 4(b)(1) of the Act, 21 U.S.C. section 360bbb-3(b)(1), unless the authorization is terminated or revoked sooner. Performed at [facility], [address], [place], [place] [address]          Radiology Studies: No results found.      Scheduled Meds: . ARIPiprazole  15 mg Oral Daily  . enoxaparin (LOVENOX) injection  40 mg Subcutaneous Q24H  . escitalopram  20 mg Oral Daily  . insulin aspart  0-9 Units Subcutaneous Q4H  . insulin detemir  15 Units Subcutaneous BID  . Lifitegrast  2 drop Both Eyes  Daily  . mirtazapine  30 mg Oral QHS  . montelukast  10 mg Oral Daily  . pantoprazole (PROTONIX) IV  40 mg Intravenous Q24H  . prazosin  2 mg Oral QHS  . pregabalin  200 mg Oral BID  . propranolol  40 mg Oral BID  . simvastatin  20 mg Oral q1800  . topiramate  100 mg Oral BID   Continuous Infusions: . sodium chloride 125 mL/hr at 12/18/19 0333     LOS: 1 day    Time spent:    Liborio Nixon, MD [facility]   If 7PM-7AM, please contact night-coverage [email or web address]  12/18/2019, 7:52 AM

## 2019-12-19 LAB — URINALYSIS, ROUTINE W REFLEX MICROSCOPIC
Bilirubin Urine: NEGATIVE
Glucose, UA: NEGATIVE mg/dL
Hgb urine dipstick: NEGATIVE
Ketones, ur: NEGATIVE mg/dL
Leukocytes,Ua: NEGATIVE
Nitrite: NEGATIVE
Protein, ur: NEGATIVE mg/dL
Specific Gravity, Urine: 1.001 — ABNORMAL LOW (ref 1.005–1.030)
pH: 7 (ref 5.0–8.0)

## 2019-12-19 LAB — GLUCOSE, CAPILLARY
Glucose-Capillary: 107 mg/dL — ABNORMAL HIGH (ref 70–99)
Glucose-Capillary: 107 mg/dL — ABNORMAL HIGH (ref 70–99)
Glucose-Capillary: 116 mg/dL — ABNORMAL HIGH (ref 70–99)
Glucose-Capillary: 136 mg/dL — ABNORMAL HIGH (ref 70–99)

## 2019-12-19 MED ORDER — OMEPRAZOLE 40 MG PO CPDR: 40.0000 mg | DELAYED_RELEASE_CAPSULE | Freq: Every day | ORAL | 3 refills | Status: DC

## 2019-12-19 MED ORDER — ONDANSETRON 8 MG PO TBDP: 8.0000 mg | ORAL_TABLET | Freq: Two times a day (BID) | ORAL | 0 refills | Status: DC | PRN

## 2019-12-19 NOTE — Discharge Instructions (Signed)
1)Soft diet advised and advance diet slowly as tolerated  2)Please follow-up with primary care physician next week for recheck and reevaluation 3)Please take omeprazole as prescribed for your stomach 4)Avoid ibuprofen/Advil/Aleve/Motrin/Goody Powders/Naproxen/BC powders/Meloxicam/Diclofenac/Indomethacin and other Nonsteroidal anti-inflammatory medications as these will make you more likely to bleed and can cause stomach ulcers, can also cause Kidney problems.

## 2019-12-19 NOTE — Progress Notes (Signed)
IV removed, patient tolerated well. Reviewed AVS with patient and patient's mother, both verbalized understanding.  Patient to be taken to short stay lobby via wheelchair and transported home by his father.

## 2019-12-19 NOTE — Discharge Summary (Signed)
Dennis Yang, is a 34 y.o. male  DOB February 19, 1986  MRN [id number].  Admission date:  12/16/2019  Admitting Physician  No admitting provider for patient encounter.  Discharge Date:  12/19/2019   Primary MD  Beola Cord, FNP  Recommendations for primary care physician for things to follow:   1)Soft diet advised and advance diet slowly as tolerated  2)Please follow-up with primary care physician next week for recheck and reevaluation 3)Please take omeprazole as prescribed for your stomach 4)Avoid ibuprofen/Advil/Aleve/Motrin/Goody Powders/Naproxen/BC powders/Meloxicam/Diclofenac/Indomethacin and other Nonsteroidal anti-inflammatory medications as these will make you more likely to bleed and can cause stomach ulcers, can also cause Kidney problems.   Admission Diagnosis  Dehydration [E86.0] Hypokalemia [E87.6] Epigastric pain [R10.13] Other chronic pancreatitis (HCC) [K86.1] Acute on chronic pancreatitis ([place]) [K85.90, K86.1]   Discharge Diagnosis  Dehydration [E86.0] Hypokalemia [E87.6] Epigastric pain [R10.13] Other chronic pancreatitis ([place]) [K86.1] Acute on chronic pancreatitis ([place]) [K85.90, K86.1]    Active Problems:   Acute on chronic/recurrent pancreatitis   Bipolar 1 disorder (HCC)   Type 2 diabetes mellitus without complication (HCC)   GERD (gastroesophageal reflux disease)   HTN (hypertension)   Hypokalemia   Acute on chronic pancreatitis ([place])      Past Medical History:  Diagnosis Date  . Anxiety   . Back pain   . Bipolar 1 disorder ([place])   . Depression   . Diabetes mellitus without complication ([place])   . Incontinence of bowel   . Incontinence of urine   . Neck pain   . Neuropathy   . Osteoarthritis   . 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 Pancreatitis   . Schizoaffective disorder ([place])   . TIA (transient ischemic attack)     Past Surgical History:  Procedure  Laterality Date  . botox injections in bladder    . CHOLECYSTECTOMY    . ELBOW FRACTURE SURGERY    . FOOT SURGERY    . HERNIA REPAIR    . LITHOTRIPSY    . NASAL SINUS SURGERY         HPI  from the history and physical done on the day of admission:    Chief Complaint: Abdominal pain  HPI: Dennis Yang is a 34 y.o. male with medical history significant of chronic pancreatitis, history of alcohol abuse in the past but none recently, diabetes, bipolar disorder, chronic back pain, history of cholecystectomy in the past, presents to the hospital with complaints of abdominal pain.  Reports onset of symptoms occurring on Sunday evening.  By Monday morning, he had worsening abdominal pain in the epigastrium, rating up into his chest as well as down into his flanks.  He had associated nausea and vomiting.  He reports having loose stools, but this is a chronic issue from his pancreatitis.  He had conjunctival regional for evaluation and had a CT scan done.  Report is unavailable at this time.  He reports been given Norco and discharged home.  He feels that the Norco will not adequately treat his  pain.  He returned the following day and was told that he would have to wait in the emergency room.  Patient was not increasing pain and therefore came to [facility] emergency room for evaluation.  ED Course: On arrival to the emergency room, he is noted to be tachycardic with heart rate in the 140s and appears dehydrated.  Potassium is low at 3.2.  Lipase elevated at 292.  He receives IV fluids and pain management.  Is been referred for admission   Hospital Course:     Brief Narrative:  Dennis Yangis a 33 y.o.malewith medical history significant ofchronic pancreatitis, history of alcohol abuse in the past but none recently, diabetes, bipolar disorder, chronic back pain, history of cholecystectomy in the past, presents to the hospital with complaints of abdominal pain. Reports onset of symptoms occurring  on Sunday evening. By Monday morning, he had worsening abdominal pain in the epigastrium, rating up into his chest as well as down into his flanks. He had associated nausea and vomiting. He reports having loose stools, but this is a chronic issue from his pancreatitis.He reports been given Norco and discharged home. He feels that the Norco will not adequately treat his pain. He returned the following day and was told that he would have to wait in the emergency room. Due to worsening pain, he presented to the [place] ED.   Assessment & Plan:   Active Problems:   Acute on chronic/recurrent pancreatitis   Bipolar 1 disorder (HCC)   Type 2 diabetes mellitus without complication (HCC)   GERD (gastroesophageal reflux disease)   HTN (hypertension)   Hypokalemia   Acute on chronic pancreatitis (HCC)   1)Acute on chronic recurrent pancreatitis. Patient denies any recent alcohol use. He is status post cholecystectomy. No new medications.Per patient, CT A/P was done at [facility]. I reviewed records scanned under media tab from [facility] and there is no mention of any imaging.  -Treated with IV fluids,, IV analgesics,  -Pain improved, patient tolerating oral intake well  2)Dysuria : Unclear if he has UTI, ?prostatitis.  -Bacterial urine culture negative, urine GC/chlamydia pending -Treated with ceftriaxone    3)Diabetes mellitus-A1c 6.9 reflecting excellent diabetic control PTA--- continue Levemir and NovoLog  4)GERD. Continue H2 blocker and Carafate    5)Bipolar 1 disorder/anxiety.--Stable,continue on Abilify, Remeron, Topamax, Lexapro  Disposition- Patient's mother at bedside, discharge plan discussed with RN Daniel at bedside as well -Tolerating oral intake well, may discharge home, no further opiates  DVT prophylaxis: Lovenox Code Status: Full code Family Communication: Discussed with patient  Consultants:   None   Procedures:   None    Antimicrobials:    Ceftriaxone    Discharge Condition: Stable  Follow UP--- PCP and psychiatrist  Diet and Activity recommendation:  As advised  Discharge Instructions    Discharge Instructions    Call MD for:  difficulty breathing, headache or visual disturbances   Complete by: As directed    Call MD for:  persistant dizziness or light-headedness   Complete by: As directed    Call MD for:  persistant nausea and vomiting   Complete by: As directed    Call MD for:  temperature >100.4   Complete by: As directed    Diet - low sodium heart healthy   Complete by: As directed    Diet Carb Modified   Complete by: As directed    Discharge instructions   Complete by: As directed    1)Soft diet advised and   advance diet slowly as tolerated  2)Please follow-up with primary care physician next week for recheck and reevaluation 3)Please take omeprazole as prescribed for your stomach 4)Avoid ibuprofen/Advil/Aleve/Motrin/Goody Powders/Naproxen/BC powders/Meloxicam/Diclofenac/Indomethacin and other Nonsteroidal anti-inflammatory medications as these will make you more likely to bleed and can cause stomach ulcers, can also cause Kidney problems.   Increase activity slowly   Complete by: As directed         Discharge Medications     Allergies as of 12/19/2019      Reactions   Ciprofloxacin Hives      Medication List    STOP taking these medications   celecoxib 100 MG capsule Commonly known as: CELEBREX   docusate sodium 100 MG capsule Commonly known as: COLACE   Narcan 4 MG/0.1ML Liqd nasal spray kit Generic drug: naloxone     TAKE these medications   acetaminophen 325 MG tablet Commonly known as: TYLENOL Take 2 tablets (650 mg total) by mouth every 6 (six) hours as needed for mild pain, fever or headache (or Fever >/= 101).   ARIPiprazole 15 MG tablet Commonly known as: ABILIFY Take 15 mg by mouth daily.   Atrovent HFA 17 MCG/ACT inhaler Generic drug:  ipratropium Inhale 1 puff into the lungs every 4 (four) hours as needed for wheezing.   Carafate 1 g tablet Generic drug: sucralfate Take 1 g by mouth 2 (two) times daily.   cetirizine 10 MG tablet Commonly known as: ZYRTEC Take 10 mg by mouth daily.   Creon 36000 UNITS Cpep capsule Generic drug: lipase/protease/amylase Take 36,000-72,000 Units by mouth See admin instructions. Take 2 capsules by mouth with meals and 1 capsule with snacks   diclofenac Sodium 1 % Gel Commonly known as: VOLTAREN Apply 2 g topically daily as needed (for pain).   Emgality 120 MG/ML Soaj Generic drug: Galcanezumab-gnlm Inject 1 Dose into the muscle every 30 (thirty) days.   escitalopram 20 MG tablet Commonly known as: LEXAPRO Take 20 mg by mouth daily.   famotidine 20 MG tablet Commonly known as: PEPCID Take 20 mg by mouth 3 (three) times daily as needed.   Farxiga 10 MG Tabs tablet Generic drug: dapagliflozin propanediol Take 10 mg by mouth daily.   fluticasone 50 MCG/ACT nasal spray Commonly known as: FLONASE Place 1 spray into both nostrils daily.   GaviLAX 17 GM/SCOOP powder Generic drug: polyethylene glycol powder Take 17 g by mouth daily.   glipiZIDE 10 MG tablet Commonly known as: GLUCOTROL Take 10 mg by mouth 2 (two) times daily.   Levemir FlexTouch 100 UNIT/ML Pen Generic drug: Insulin Detemir Inject 30 Units into the skin 2 (two) times daily. 30 units in the morning and 30 units at bedtime   lidocaine 4 % external solution Commonly known as: XYLOCAINE Apply topically daily as needed for mild pain or moderate pain.   meclizine 25 MG tablet Commonly known as: ANTIVERT Take 25 mg by mouth every 6 (six) hours as needed for dizziness or nausea.   metaxalone 800 MG tablet Commonly known as: SKELAXIN Take 800 mg by mouth every 8 (eight) hours as needed for muscle spasms.   mirtazapine 30 MG tablet Commonly known as: REMERON Take 30 mg by mouth at bedtime.   montelukast  10 MG tablet Commonly known as: SINGULAIR Take 10 mg by mouth daily.   nitroGLYCERIN 0.4 MG SL tablet Commonly known as: NITROSTAT Place 0.4 mg under the tongue every 5 (five) minutes as needed for chest pain.   NovoLOG FlexPen 100   UNIT/ML FlexPen Generic drug: insulin aspart Inject 15 Units into the skin 3 (three) times daily with meals.   omeprazole 40 MG capsule Commonly known as: PRILOSEC Take 1 capsule (40 mg total) by mouth daily.   ondansetron 8 MG disintegrating tablet Commonly known as: ZOFRAN-ODT Take 1 tablet (8 mg total) by mouth 2 (two) times daily as needed for nausea or vomiting.   prazosin 2 MG capsule Commonly known as: MINIPRESS Take 1 capsule (2 mg total) by mouth at bedtime.   pregabalin 200 MG capsule Commonly known as: LYRICA Take 200 mg by mouth 2 (two) times daily.   propranolol 40 MG tablet Commonly known as: INDERAL Take 40 mg by mouth 2 (two) times daily.   simvastatin 20 MG tablet Commonly known as: ZOCOR Take 20 mg by mouth daily at 6 PM.   topiramate 100 MG tablet Commonly known as: TOPAMAX Take 100 mg by mouth 2 (two) times daily.   Vitamin D3 1.25 MG (50000 UT) Caps Take 1 capsule by mouth every Monday, Wednesday, and Friday. 3 times a week   Xiidra 5 % Soln Generic drug: Lifitegrast Place 2 drops into both eyes daily.       Major procedures and Radiology Reports - PLEASE review detailed and final reports for all details, in brief -   No results found.  Micro Results   Recent Results (from the past 240 hour(s))  SARS CORONAVIRUS 2 (TAT 6-24 HRS) Nasopharyngeal Nasopharyngeal Swab     Status: None   Collection Time: 12/16/19  1:50 PM   Specimen: Nasopharyngeal Swab  Result Value Ref Range Status   SARS Coronavirus 2 NEGATIVE NEGATIVE Final    Comment: (NOTE) SARS-CoV-2 target nucleic acids are NOT DETECTED. The SARS-CoV-2 RNA is generally detectable in upper and lower respiratory specimens during the acute phase of  infection. Negative results do not preclude SARS-CoV-2 infection, do not rule out co-infections with other pathogens, and should not be used as the sole basis for treatment or other patient management decisions. Negative results must be combined with clinical observations, patient history, and epidemiological information. The expected result is Negative. Fact Sheet for Patients: https://www.fda.gov/media/138098/download Fact Sheet for Healthcare Providers: https://www.fda.gov/media/138095/download This test is not yet approved or cleared by the [place] FDA and  has been authorized for detection and/or diagnosis of SARS-CoV-2 by FDA under an Emergency Use Authorization (EUA). This EUA will remain  in effect (meaning this test can be used) for the duration of the COVID-19 declaration under Section 56 4(b)(1) of the Act, 21 U.S.C. section 360bbb-3(b)(1), unless the authorization is terminated or revoked sooner. Performed at [facility], [address] [place], [place] [address]     Today   Subjective    Dennis Yang today has no new complaints        -Eating and drinking well - Patient's mother at bedside, discharge plan discussed with RN Daniel at bedside as well -Tolerating oral intake well, may discharge home, no further opiates   Patient has been seen and examined prior to discharge   Objective   Blood pressure 113/62, pulse 67, temperature 97.6 F (36.4 C), temperature source Oral, resp. rate 17, height 6' (1.829 m), weight 102.9 kg, SpO2 96 %.   Intake/Output Summary (Last 24 hours) at 12/19/2019 1159 Last data filed at 12/19/2019 0900 Gross per 24 hour  Intake 1820 ml  Output 1050 ml  Net 770 ml    Exam Gen:- Awake Alert, no acute distress  HEENT:-   [place].AT, No sclera icterus Neck-Supple Neck,No JVD,.  Lungs-  CTAB , good air movement bilaterally  CV- S1, S2 normal, regular Abd-  +ve B.Sounds, Abd Soft, No tenderness, no CVA area tenderness     Extremity/Skin:- No  edema,   good pulses Psych-affect is appropriate, oriented x3 Neuro-no new focal deficits, no tremors    Data Review   CBC w Diff:  Lab Results  Component Value Date   WBC 7.8 12/17/2019   HGB 11.8 (L) 12/17/2019   HCT 36.3 (L) 12/17/2019   PLT 161 12/17/2019   LYMPHOPCT 16 12/16/2019   MONOPCT 8 12/16/2019   EOSPCT 2 12/16/2019   BASOPCT 0 12/16/2019    CMP:  Lab Results  Component Value Date   NA 138 12/18/2019   K 3.6 12/18/2019   CL 111 12/18/2019   CO2 21 (L) 12/18/2019   BUN 8 12/18/2019   CREATININE 0.86 12/18/2019   PROT 6.1 (L) 12/18/2019   ALBUMIN 3.2 (L) 12/18/2019   BILITOT 0.6 12/18/2019   ALKPHOS 97 12/18/2019   AST 38 12/18/2019   ALT 57 (H) 12/18/2019  . Total Discharge time is about 33 minutes  Roxan Hockey M.D on 12/19/2019 at 11:59 AM  Go to [email or web address] -  for contact info  [facility] - Office  [phone]

## 2019-12-20 LAB — URINE CULTURE: Culture: NO GROWTH

## 2019-12-21 LAB — GC/CHLAMYDIA PROBE AMP (~~LOC~~) NOT AT ARMC
Chlamydia: POSITIVE — AB
Comment: NEGATIVE
Comment: NORMAL
Neisseria Gonorrhea: NEGATIVE

## 2019-12-27 ENCOUNTER — Telehealth: Payer: Self-pay | Admitting: Internal Medicine

## 2019-12-27 MED ORDER — DOXYCYCLINE HYCLATE 50 MG PO CAPS: 100.0000 mg | ORAL_CAPSULE | Freq: Two times a day (BID) | ORAL | 0 refills | Status: AC

## 2019-12-27 NOTE — Telephone Encounter (Signed)
Received patient's urine GC/chlamydia test results in in basket.  Urine chlamydia positive.  Call patient to update him on the results.  He was unaware of this and appreciated the call.  Ordered doxycycline 100 mg twice a day for 7 days.  Prescription sent to his preferred pharmacy.  Discussed importance of barrier precaution. Asked that he follow-up with his PCP in 1-2 weeks.  All questions answered.  Raford Pitcher, MD Internal Medicine  Hospitalist

## 2020-01-15 ENCOUNTER — Encounter (HOSPITAL_COMMUNITY): Payer: Self-pay

## 2020-01-15 ENCOUNTER — Other Ambulatory Visit: Payer: Self-pay

## 2020-01-15 ENCOUNTER — Emergency Department (HOSPITAL_COMMUNITY)
Admission: EM | Admit: 2020-01-15 | Discharge: 2020-01-15 | Disposition: A | Discharge status: Home / Self Care | Payer: Medicaid - Out of State | Attending: Emergency Medicine | Admitting: Emergency Medicine

## 2020-01-15 DIAGNOSIS — Z79899 Other long term (current) drug therapy: Secondary | ICD-10-CM | POA: Insufficient documentation

## 2020-01-15 DIAGNOSIS — Z8673 Personal history of transient ischemic attack (TIA), and cerebral infarction without residual deficits: Secondary | ICD-10-CM | POA: Insufficient documentation

## 2020-01-15 DIAGNOSIS — I10 Essential (primary) hypertension: Secondary | ICD-10-CM | POA: Diagnosis not present

## 2020-01-15 DIAGNOSIS — Z87891 Personal history of nicotine dependence: Secondary | ICD-10-CM | POA: Insufficient documentation

## 2020-01-15 DIAGNOSIS — Z794 Long term (current) use of insulin: Secondary | ICD-10-CM | POA: Insufficient documentation

## 2020-01-15 DIAGNOSIS — R739 Hyperglycemia, unspecified: Secondary | ICD-10-CM

## 2020-01-15 DIAGNOSIS — E1165 Type 2 diabetes mellitus with hyperglycemia: Secondary | ICD-10-CM | POA: Diagnosis not present

## 2020-01-15 LAB — URINALYSIS, ROUTINE W REFLEX MICROSCOPIC
Bacteria, UA: NONE SEEN
Bilirubin Urine: NEGATIVE
Glucose, UA: >=500 mg/dL — AB
Hgb urine dipstick: NEGATIVE
Ketones, ur: NEGATIVE mg/dL
Leukocytes,Ua: NEGATIVE
Nitrite: NEGATIVE
Protein, ur: NEGATIVE mg/dL
Specific Gravity, Urine: 1.024 (ref 1.005–1.030)
pH: 7 (ref 5.0–8.0)

## 2020-01-15 LAB — BASIC METABOLIC PANEL
Anion gap: 11 (ref 5–15)
BUN: 20 mg/dL (ref 6–20)
CO2: 20 mmol/L — ABNORMAL LOW (ref 22–32)
Calcium: 8.9 mg/dL (ref 8.9–10.3)
Chloride: 103 mmol/L (ref 98–111)
Creatinine, Ser: 0.95 mg/dL (ref 0.61–1.24)
GFR calc Af Amer: >60 mL/min (ref >60)
GFR calc non Af Amer: >60 mL/min (ref >60)
Glucose, Bld: 473 mg/dL — ABNORMAL HIGH (ref 70–99)
Potassium: 3.7 mmol/L (ref 3.5–5.1)
Sodium: 134 mmol/L — ABNORMAL LOW (ref 135–145)

## 2020-01-15 LAB — CBC
HCT: 37.6 % — ABNORMAL LOW (ref 39.0–52.0)
Hemoglobin: 12.7 g/dL — ABNORMAL LOW (ref 13.0–17.0)
MCH: 29.2 pg (ref 26.0–34.0)
MCHC: 33.8 g/dL (ref 30.0–36.0)
MCV: 86.4 fL (ref 80.0–100.0)
Platelets: 198 10*3/uL (ref 150–400)
RBC: 4.35 MIL/uL (ref 4.22–5.81)
RDW: 13.1 % (ref 11.5–15.5)
WBC: 6.9 10*3/uL (ref 4.0–10.5)
nRBC: 0 % (ref 0.0–0.2)

## 2020-01-15 LAB — CBG MONITORING, ED
Glucose-Capillary: 456 mg/dL — ABNORMAL HIGH (ref 70–99)
Glucose-Capillary: 287 mg/dL — ABNORMAL HIGH (ref 70–99)
Glucose-Capillary: 396 mg/dL — ABNORMAL HIGH (ref 70–99)

## 2020-01-15 LAB — LIPASE, BLOOD: Lipase: 61 U/L — ABNORMAL HIGH (ref 11–51)

## 2020-01-15 MED ORDER — ONDANSETRON 4 MG PO TBDP
4.0000 mg | ORAL_TABLET | Freq: Once | ORAL | Status: AC
  Administered 2020-01-15: 4 mg via ORAL
  Filled 2020-01-15: qty 1

## 2020-01-15 MED ORDER — DICYCLOMINE HCL 10 MG PO CAPS
10.0000 mg | ORAL_CAPSULE | Freq: Once | ORAL | Status: AC
  Administered 2020-01-15: 10 mg via ORAL
  Filled 2020-01-15: qty 1

## 2020-01-15 NOTE — ED Provider Notes (Signed)
[facility] EMERGENCY DEPARTMENT Provider Note   CSN: [id number] Arrival date & time: 01/15/20  1612     History Chief Complaint  Patient presents with  . Hyperglycemia    Dennis Yang is a 34 y.o. male with PMH significant for IDDM, pancreatitis, TIA, type I bipolar disorder, diabetic neuropathy, and GERD who presents to the ED with complaints of hyperglycemia.  Patient reports that his glucose was 560 today and he has been symptomatic with abdominal discomfort, nausea, and headache.  Patient reports that his blood sugars were mildly elevated last night, however he felt fine.  This morning he woke up and his CBG had worsened.  He reports eating a chicken wrap around 11:30 AM.  When he noticed his blood glucose of 560, he decided to head to the ER as he had recently been admitted at hospital in [place], [place] for hyperglycemia 1 to 2 weeks ago.  Patient reports that his headache, nausea, and generalized abdominal discomfort began while in route to the hospital here today.  He denies any history of admission for DKA/HHS.   HPI     Past Medical History:  Diagnosis Date  . Anxiety   . Back pain   . Bipolar 1 disorder (HCC)   . Depression   . Diabetes mellitus without complication (HCC)   . Incontinence of bowel   . Incontinence of urine   . Neck pain   . Neuropathy   . Osteoarthritis   . Osteonecrosis (HCC)    left foot  . Pancreatitis   . Schizoaffective disorder (HCC)   . TIA (transient ischemic attack)     Patient Active Problem List   Diagnosis Date Noted  . GERD (gastroesophageal reflux disease) 12/16/2019  . HTN (hypertension) 12/16/2019  . Hypokalemia 12/16/2019  . Acute on chronic pancreatitis (HCC) 12/16/2019  . Acute on chronic/recurrent pancreatitis 08/12/2019  . Acute on chronic/recurrent pancreatitis 01/27/2019  . Bipolar 1 disorder (HCC) 01/27/2019  . Depression with anxiety 01/27/2019  . Type 2 diabetes mellitus without complication (HCC) 01/27/2019    Past  Surgical History:  Procedure Laterality Date  . botox injections in bladder    . CHOLECYSTECTOMY    . ELBOW FRACTURE SURGERY    . FOOT SURGERY    . HERNIA REPAIR    . LITHOTRIPSY    . NASAL SINUS SURGERY         No family history on file.  Social History   Tobacco Use  . Smoking status: Former Games developer  . Smokeless tobacco: Never Used  Substance Use Topics  . Alcohol use: Not Currently  . Drug use: Not Currently    Home Medications Prior to Admission medications   Medication Sig Start Date End Date Taking? Authorizing Provider  acetaminophen (TYLENOL) 325 MG tablet Take 2 tablets (650 mg total) by mouth every 6 (six) hours as needed for mild pain, fever or headache (or Fever >/= 101). 08/13/19  Yes Emokpae, Courage, MD  ATROVENT HFA 17 MCG/ACT inhaler Inhale 1 puff into the lungs every 4 (four) hours as needed for wheezing.  01/16/19  Yes [provider]  celecoxib (CELEBREX) 100 MG capsule Take 100 mg by mouth 2 (two) times daily. 12/25/19  Yes [provider]  cetirizine (ZYRTEC) 10 MG tablet Take 10 mg by mouth daily. 11/16/19  Yes [provider]  Cholecalciferol (VITAMIN D3) 1.25 MG (50000 UT) CAPS Take 1 capsule by mouth every Monday, Wednesday, and Friday. 3 times a week 12/24/18  Yes [provider]  CREON 36000 units CPEP capsule Take 36,000-72,000 Units by mouth See admin instructions. Take 2 capsules by mouth with meals and 1 capsule with snacks 11/16/19  Yes [provider]  diclofenac Sodium (VOLTAREN) 1 % GEL Apply 2 g topically daily as needed (for pain).  11/23/19  Yes [provider]  EMGALITY 120 MG/ML SOAJ Inject 1 Dose into the muscle every 30 (thirty) days.  11/24/18  Yes [provider]  famotidine (PEPCID) 20 MG tablet Take 20 mg by mouth 3 (three) times daily as needed for heartburn or indigestion.  11/16/19  Yes [provider]  FARXIGA 10 MG TABS tablet Take 10 mg by mouth daily.  01/07/19  Yes  [provider]  fluticasone (FLONASE) 50 MCG/ACT nasal spray Place 1 spray into both nostrils daily.  01/20/19  Yes [provider]  glipiZIDE (GLUCOTROL) 10 MG tablet Take 10 mg by mouth 2 (two) times daily. 01/19/19  Yes [provider]  Insulin Detemir (LEVEMIR FLEXTOUCH) 100 UNIT/ML Pen Inject 30 Units into the skin 2 (two) times daily. 30 units in the morning and 30 units at bedtime 03/31/19  Yes [provider]  lidocaine (XYLOCAINE) 4 % external solution Apply topically daily as needed for mild pain or moderate pain.  11/23/19  Yes [provider]  meclizine (ANTIVERT) 25 MG tablet Take 25 mg by mouth every 6 (six) hours as needed for dizziness or nausea.  10/01/19  Yes [provider]  metaxalone (SKELAXIN) 800 MG tablet Take 800 mg by mouth every 8 (eight) hours as needed for muscle spasms.  12/03/19  Yes [provider]  mirtazapine (REMERON) 30 MG tablet Take 30 mg by mouth at bedtime. 12/09/19  Yes [provider]  montelukast (SINGULAIR) 10 MG tablet Take 10 mg by mouth daily.  01/12/19  Yes [provider]  nitroGLYCERIN (NITROSTAT) 0.4 MG SL tablet Place 0.4 mg under the tongue every 5 (five) minutes as needed for chest pain.  08/18/19  Yes [provider]  NOVOLOG FLEXPEN 100 UNIT/ML FlexPen Inject 15 Units into the skin 3 (three) times daily as needed for high blood sugar.  04/27/19  Yes [provider]  omeprazole (PRILOSEC) 40 MG capsule Take 1 capsule (40 mg total) by mouth daily. 12/19/19  Yes Emokpae, Courage, MD  ondansetron (ZOFRAN-ODT) 8 MG disintegrating tablet Take 1 tablet (8 mg total) by mouth 2 (two) times daily as needed for nausea or vomiting. Patient taking differently: Take 8 mg by mouth 3 (three) times daily before meals.  12/19/19  Yes Emokpae, Courage, MD  prazosin (MINIPRESS) 2 MG capsule Take 1 capsule (2 mg total) by mouth at bedtime. Patient taking differently: Take 2 mg by  mouth every morning.  08/13/19  Yes Emokpae, Courage, MD  pregabalin (LYRICA) 200 MG capsule Take 200 mg by mouth 2 (two) times daily.   Yes [provider]  propranolol (INDERAL) 40 MG tablet Take 40 mg by mouth 2 (two) times daily.  01/12/19  Yes [provider]  simvastatin (ZOCOR) 20 MG tablet Take 10 mg by mouth daily at 6 PM.  12/25/18  Yes [provider]  topiramate (TOPAMAX) 100 MG tablet Take 100 mg by mouth 2 (two) times daily. 05/18/19  Yes [provider]  XIIDRA 5 % SOLN Place 2 drops into both eyes daily.  01/21/19  Yes [provider]  ARIPiprazole (ABILIFY) 15 MG tablet Take 15 mg by mouth daily.  [provider]  chlorhexidine (HIBICLENS) 4 % external liquid Apply 1 application topically daily as needed (for skin irritation as directed).  01/13/20   [provider]  doxycycline (VIBRA-TABS) 100 MG tablet Take 100 mg by mouth 2 (two) times daily. 01/13/20   [provider]  escitalopram (LEXAPRO) 20 MG tablet Take 20 mg by mouth daily.  01/22/19   [provider]    Allergies    Ciprofloxacin  Review of Systems   Review of Systems  Physical Exam Updated Vital Signs BP 108/66   Pulse 79   Temp 98 F (36.7 C) (Oral)   Resp 18   Ht 6' (1.829 m)   Wt 99.3 kg   SpO2 99%   BMI 29.70 kg/m   Physical Exam Vitals and nursing note reviewed. Exam conducted with a chaperone present.  Constitutional:      Appearance: Normal appearance.  HENT:     Head: Normocephalic and atraumatic.  Eyes:     General: No scleral icterus.    Conjunctiva/sclera: Conjunctivae normal.  Cardiovascular:     Rate and Rhythm: Normal rate and regular rhythm.     Pulses: Normal pulses.     Heart sounds: Normal heart sounds.  Pulmonary:     Effort: Pulmonary effort is normal. No respiratory distress.     Breath sounds: Normal breath sounds.  Abdominal:     Comments: Soft, nondistended.  Generalized abdominal TTP, most  notably in epigastric region.  No guarding.  No overlying skin changes.  No mass appreciated.  Skin:    General: Skin is dry.     Capillary Refill: Capillary refill takes less than 2 seconds.  Neurological:     Mental Status: He is alert and oriented to person, place, and time.     GCS: GCS eye subscore is 4. GCS verbal subscore is 5. GCS motor subscore is 6.  Psychiatric:        Mood and Affect: Mood normal.        Behavior: Behavior normal.        Thought Content: Thought content normal.      ED Results / Procedures / Treatments   Labs (all labs ordered are listed, but only abnormal results are displayed) Labs Reviewed  BASIC METABOLIC PANEL - Abnormal; Notable for the following components:      Result Value   Sodium 134 (*)    CO2 20 (*)    Glucose, Bld 473 (*)    All other components within normal limits  CBC - Abnormal; Notable for the following components:   Hemoglobin 12.7 (*)    HCT 37.6 (*)    All other components within normal limits  URINALYSIS, ROUTINE W REFLEX MICROSCOPIC - Abnormal; Notable for the following components:   Color, Urine STRAW (*)    Glucose, UA >=500 (*)    All other components within normal limits  LIPASE, BLOOD - Abnormal; Notable for the following components:   Lipase 61 (*)    All other components within normal limits  CBG MONITORING, ED - Abnormal; Notable for the following components:   Glucose-Capillary 456 (*)    All other components within normal limits  CBG MONITORING, ED - Abnormal; Notable for the following components:   Glucose-Capillary 396 (*)    All other components within normal limits  CBG MONITORING, ED - Abnormal; Notable for the following components:   Glucose-Capillary 287 (*)    All other components within normal limits    EKG  None  Radiology No results found.  Procedures Procedures (including critical care time)  Medications Ordered in ED Medications  ondansetron (ZOFRAN-ODT) disintegrating tablet 4 mg (4  mg Oral Given 01/15/20 2034)  dicyclomine (BENTYL) capsule 10 mg (10 mg Oral Given 01/15/20 2034)    ED Course  I have reviewed the triage vital signs and the nursing notes.  Pertinent labs & imaging results that were available during my care of the patient were reviewed by me and considered in my medical decision making (see chart for details).    MDM Rules/Calculators/A&P                      Patient has elevated lipase of 61, however down significantly from his typical lipase levels.  He has chronic pancreatitis.  His hemoglobin is also mildly decreased to 12.7, but up from baseline.  Aside from elevated glucose of 473, his BMP was unremarkable.  Encouraging some chicken broth or other salty foods to replenish his very mild hypokalemia at 134.  His CBG came down to 87 during his duration here in the ED.  He complained of some crampy abdominal discomfort, but his physical exam failed to elicit any focal tenderness.  Provided Bentyl 10 mg as well as Zofran ODT and he was p.o. challenge prior to discharge.  He was able to tolerate food and liquids fine.  He has a follow-up with his gastroenterologist on Monday in [place], [place].  He will follow-up with his primary care provider regarding his elevated glucose levels as she prefers that he ultimately be followed by endocrinology for his uncontrolled diabetes.  His vital signs are all within normal limits and he is hemodynamically stable and neurovascular intact.  He reports that he has a prescription for Bentyl as well as for Zofran at home.  Discussed case with Dr. Eulis Foster who is agreeable to plan.  Strict return precautions discussed with patient.  All of the evaluation and work-up results were discussed with the patient and any family at bedside. They were provided opportunity to ask any additional questions and have none at this time. They have expressed understanding of verbal discharge instructions as well as return precautions and are agreeable  to the plan.    Final Clinical Impression(s) / ED Diagnoses Final diagnoses:  Hyperglycemia    Rx / DC Orders ED Discharge Orders    None       Reita Chard 01/15/20 2039    Daleen Bo, MD 01/16/20 1547

## 2020-01-15 NOTE — Discharge Instructions (Addendum)
Please go to your appointment with [facility] in [place], [place] as scheduled on 01/18/2020.  Please continue take Bentyl and Zofran as needed for your abdominal discomfort and nausea symptoms.  It is important that you continue to monitor your blood glucose well at home.  Please return to the ED or seek immediate medical attention for any new or worsening symptoms.

## 2020-01-15 NOTE — ED Triage Notes (Signed)
Pt brought to ED with complaints of hyperglycemia started last night. Pt states glucose has been 560 today. Pt states he has been nauseated, has headache, and has abdominal pain started today also.

## 2020-01-15 NOTE — ED Notes (Signed)
CBG 287  

## 2020-04-09 HISTORY — PX: PANCREATECTOMY: SHX1019

## 2020-07-31 ENCOUNTER — Encounter (HOSPITAL_COMMUNITY): Payer: Self-pay | Admitting: *Deleted

## 2020-07-31 ENCOUNTER — Emergency Department (HOSPITAL_COMMUNITY)
Admission: EM | Admit: 2020-07-31 | Discharge: 2020-08-01 | Disposition: A | Discharge status: Home / Self Care | Payer: Medicare Other | Attending: Emergency Medicine | Admitting: Emergency Medicine

## 2020-07-31 ENCOUNTER — Emergency Department (HOSPITAL_COMMUNITY): Payer: Medicare Other

## 2020-07-31 ENCOUNTER — Other Ambulatory Visit: Payer: Self-pay

## 2020-07-31 DIAGNOSIS — Z79899 Other long term (current) drug therapy: Secondary | ICD-10-CM | POA: Insufficient documentation

## 2020-07-31 DIAGNOSIS — K654 Sclerosing mesenteritis: Secondary | ICD-10-CM | POA: Diagnosis not present

## 2020-07-31 DIAGNOSIS — Z794 Long term (current) use of insulin: Secondary | ICD-10-CM | POA: Insufficient documentation

## 2020-07-31 DIAGNOSIS — Z87891 Personal history of nicotine dependence: Secondary | ICD-10-CM | POA: Insufficient documentation

## 2020-07-31 DIAGNOSIS — R739 Hyperglycemia, unspecified: Secondary | ICD-10-CM

## 2020-07-31 DIAGNOSIS — E1165 Type 2 diabetes mellitus with hyperglycemia: Secondary | ICD-10-CM | POA: Diagnosis not present

## 2020-07-31 LAB — COMPREHENSIVE METABOLIC PANEL
ALT: 36 U/L (ref 0–44)
AST: 25 U/L (ref 15–41)
Albumin: 4.9 g/dL (ref 3.5–5.0)
Alkaline Phosphatase: 173 U/L — ABNORMAL HIGH (ref 38–126)
Anion gap: 15 (ref 5–15)
BUN: 24 mg/dL — ABNORMAL HIGH (ref 6–20)
CO2: 24 mmol/L (ref 22–32)
Calcium: 9.7 mg/dL (ref 8.9–10.3)
Chloride: 97 mmol/L — ABNORMAL LOW (ref 98–111)
Creatinine, Ser: 1.27 mg/dL — ABNORMAL HIGH (ref 0.61–1.24)
GFR calc Af Amer: >60 mL/min (ref >60)
GFR calc non Af Amer: >60 mL/min (ref >60)
Glucose, Bld: 487 mg/dL — ABNORMAL HIGH (ref 70–99)
Potassium: 4 mmol/L (ref 3.5–5.1)
Sodium: 136 mmol/L (ref 135–145)
Total Bilirubin: 0.6 mg/dL (ref 0.3–1.2)
Total Protein: 8 g/dL (ref 6.5–8.1)

## 2020-07-31 LAB — CBG MONITORING, ED
Glucose-Capillary: >600 mg/dL (ref 70–99)
Glucose-Capillary: 512 mg/dL (ref 70–99)
Glucose-Capillary: 261 mg/dL — ABNORMAL HIGH (ref 70–99)

## 2020-07-31 LAB — URINALYSIS, ROUTINE W REFLEX MICROSCOPIC
Bacteria, UA: NONE SEEN
Bilirubin Urine: NEGATIVE
Glucose, UA: >=500 mg/dL — AB
Hgb urine dipstick: NEGATIVE
Ketones, ur: 20 mg/dL — AB
Leukocytes,Ua: NEGATIVE
Nitrite: NEGATIVE
Protein, ur: NEGATIVE mg/dL
Specific Gravity, Urine: 1.029 (ref 1.005–1.030)
pH: 6 (ref 5.0–8.0)

## 2020-07-31 LAB — CBC WITH DIFFERENTIAL/PLATELET
Abs Immature Granulocytes: 0.04 10*3/uL (ref 0.00–0.07)
Basophils Absolute: 0.1 10*3/uL (ref 0.0–0.1)
Basophils Relative: 1 %
Eosinophils Absolute: 0.4 10*3/uL (ref 0.0–0.5)
Eosinophils Relative: 3 %
HCT: 45.2 % (ref 39.0–52.0)
Hemoglobin: 14.3 g/dL (ref 13.0–17.0)
Immature Granulocytes: 0 %
Lymphocytes Relative: 35 %
Lymphs Abs: 4.4 10*3/uL — ABNORMAL HIGH (ref 0.7–4.0)
MCH: 26.6 pg (ref 26.0–34.0)
MCHC: 31.6 g/dL (ref 30.0–36.0)
MCV: 84.2 fL (ref 80.0–100.0)
Monocytes Absolute: 1.1 10*3/uL — ABNORMAL HIGH (ref 0.1–1.0)
Monocytes Relative: 9 %
Neutro Abs: 6.5 10*3/uL (ref 1.7–7.7)
Neutrophils Relative %: 52 %
Platelets: 336 10*3/uL (ref 150–400)
RBC: 5.37 MIL/uL (ref 4.22–5.81)
RDW: 15.9 % — ABNORMAL HIGH (ref 11.5–15.5)
WBC: 12.5 10*3/uL — ABNORMAL HIGH (ref 4.0–10.5)
nRBC: 0 % (ref 0.0–0.2)

## 2020-07-31 LAB — BETA-HYDROXYBUTYRIC ACID: Beta-Hydroxybutyric Acid: 1.94 mmol/L — ABNORMAL HIGH (ref 0.05–0.27)

## 2020-07-31 MED ORDER — INSULIN ASPART 100 UNIT/ML IV SOLN
10.0000 [IU] | Freq: Once | INTRAVENOUS | Status: AC
  Administered 2020-07-31: 10 [IU] via INTRAVENOUS

## 2020-07-31 MED ORDER — SODIUM CHLORIDE 0.9 % IV BOLUS
2000.0000 mL | Freq: Once | INTRAVENOUS | Status: AC
  Administered 2020-07-31: 2000 mL via INTRAVENOUS

## 2020-07-31 MED ORDER — INSULIN ASPART 100 UNIT/ML ~~LOC~~ SOLN
15.0000 [IU] | Freq: Once | SUBCUTANEOUS | Status: AC
  Administered 2020-07-31: 15 [IU] via SUBCUTANEOUS
  Filled 2020-07-31: qty 1

## 2020-07-31 NOTE — ED Triage Notes (Signed)
Pt also concerned that he may have a kidney stone that started yesterday,

## 2020-07-31 NOTE — ED Triage Notes (Signed)
Pt c/o elevated blood sugar of 586 today along with headache, vision problems, dizziness as well.

## 2020-07-31 NOTE — ED Provider Notes (Signed)
[facility] EMERGENCY DEPARTMENT Provider Note   CSN: [id number] Arrival date & time: 07/31/20  1650     History Chief Complaint  Patient presents with  . Hyperglycemia    Alieu Finnigan is a 34 y.o. male.  Patient states that he has been feeling weak lately.  His sugar has been elevated   Hyperglycemia      Past Medical History:  Diagnosis Date  . Anxiety   . Back pain   . Bipolar 1 disorder (HCC)   . Depression   . Diabetes mellitus without complication (HCC)   . Incontinence of bowel   . Incontinence of urine   . Neck pain   . Neuropathy   . Osteoarthritis   . Osteonecrosis (HCC)    left foot  . Pancreatitis   . Schizoaffective disorder (HCC)   . TIA (transient ischemic attack)     Patient Active Problem List   Diagnosis Date Noted  . GERD (gastroesophageal reflux disease) 12/16/2019  . HTN (hypertension) 12/16/2019  . Hypokalemia 12/16/2019  . Acute on chronic pancreatitis (HCC) 12/16/2019  . Acute on chronic/recurrent pancreatitis 08/12/2019  . Acute on chronic/recurrent pancreatitis 01/27/2019  . Bipolar 1 disorder (HCC) 01/27/2019  . Depression with anxiety 01/27/2019  . Type 2 diabetes mellitus without complication (HCC) 01/27/2019    Past Surgical History:  Procedure Laterality Date  . botox injections in bladder    . CHOLECYSTECTOMY    . ELBOW FRACTURE SURGERY    . FOOT SURGERY    . HERNIA REPAIR    . LITHOTRIPSY    . NASAL SINUS SURGERY         No family history on file.  Social History   Tobacco Use  . Smoking status: Former Games developer  . Smokeless tobacco: Never Used  Vaping Use  . Vaping Use: Never used  Substance Use Topics  . Alcohol use: Not Currently  . Drug use: Not Currently    Home Medications Prior to Admission medications   Medication Sig Start Date End Date Taking? Authorizing Provider  acetaminophen (TYLENOL) 325 MG tablet Take 2 tablets (650 mg total) by mouth every 6 (six) hours as needed for mild pain, fever or  headache (or Fever >/= 101). 08/13/19   Emokpae, Courage, MD  ARIPiprazole (ABILIFY) 15 MG tablet Take 15 mg by mouth daily.    [provider]  ATROVENT HFA 17 MCG/ACT inhaler Inhale 1 puff into the lungs every 4 (four) hours as needed for wheezing.  01/16/19   [provider]  celecoxib (CELEBREX) 100 MG capsule Take 100 mg by mouth 2 (two) times daily. 12/25/19   [provider]  cetirizine (ZYRTEC) 10 MG tablet Take 10 mg by mouth daily. 11/16/19   [provider]  chlorhexidine (HIBICLENS) 4 % external liquid Apply 1 application topically daily as needed (for skin irritation as directed).  01/13/20   [provider]  Cholecalciferol (VITAMIN D3) 1.25 MG (50000 UT) CAPS Take 1 capsule by mouth every Monday, Wednesday, and Friday. 3 times a week 12/24/18   [provider]  CREON 36000 units CPEP capsule Take 36,000-72,000 Units by mouth See admin instructions. Take 2 capsules by mouth with meals and 1 capsule with snacks 11/16/19   [provider]  diclofenac Sodium (VOLTAREN) 1 % GEL Apply 2 g topically daily as needed (for pain).  11/23/19   [provider]  doxycycline (VIBRA-TABS) 100 MG tablet Take 100 mg by mouth 2 (two) times daily. 01/13/20  [provider]  EMGALITY 120 MG/ML SOAJ Inject 1 Dose into the muscle every 30 (thirty) days.  11/24/18   [provider]  escitalopram (LEXAPRO) 20 MG tablet Take 20 mg by mouth daily.  01/22/19   [provider]  famotidine (PEPCID) 20 MG tablet Take 20 mg by mouth 3 (three) times daily as needed for heartburn or indigestion.  11/16/19   [provider]  FARXIGA 10 MG TABS tablet Take 10 mg by mouth daily.  01/07/19   [provider]  fluticasone (FLONASE) 50 MCG/ACT nasal spray Place 1 spray into both nostrils daily.  01/20/19   [provider]  glipiZIDE (GLUCOTROL) 10 MG tablet Take 10 mg by mouth 2 (two) times daily. 01/19/19   [provider]  Insulin Detemir (LEVEMIR FLEXTOUCH) 100 UNIT/ML Pen Inject 30 Units into the skin 2 (two) times daily. 30 units in the morning and 30 units at bedtime 03/31/19   [provider]  lidocaine (XYLOCAINE) 4 % external solution Apply topically daily as needed for mild pain or moderate pain.  11/23/19   [provider]  meclizine (ANTIVERT) 25 MG tablet Take 25 mg by mouth every 6 (six) hours as needed for dizziness or nausea.  10/01/19   [provider]  metaxalone (SKELAXIN) 800 MG tablet Take 800 mg by mouth every 8 (eight) hours as needed for muscle spasms.  12/03/19   [provider]  mirtazapine (REMERON) 30 MG tablet Take 30 mg by mouth at bedtime. 12/09/19   [provider]  montelukast (SINGULAIR) 10 MG tablet Take 10 mg by mouth daily.  01/12/19   [provider]  nitroGLYCERIN (NITROSTAT) 0.4 MG SL tablet Place 0.4 mg under the tongue every 5 (five) minutes as needed for chest pain.  08/18/19   [provider]  NOVOLOG FLEXPEN 100 UNIT/ML FlexPen Inject 15 Units into the skin 3 (three) times daily as needed for high blood sugar.  04/27/19   [provider]  omeprazole (PRILOSEC) 40 MG capsule Take 1 capsule (40 mg total) by mouth daily. 12/19/19   Shon Hale, MD  ondansetron (ZOFRAN-ODT) 8 MG disintegrating tablet Take 1 tablet (8 mg total) by mouth 2 (two) times daily as needed for nausea or vomiting. Patient taking differently: Take 8 mg by mouth 3 (three) times daily before meals.  12/19/19   Shon Hale, MD  prazosin (MINIPRESS) 2 MG capsule Take 1 capsule (2 mg total) by mouth at bedtime. Patient taking differently: Take 2 mg by mouth every morning.  08/13/19   Shon Hale, MD  pregabalin (LYRICA) 200 MG capsule Take 200 mg by mouth 2 (two) times daily.    [provider]  propranolol (INDERAL) 40 MG tablet Take 40 mg by mouth 2 (two) times daily.  01/12/19   [provider]    simvastatin (ZOCOR) 20 MG tablet Take 10 mg by mouth daily at 6 PM.  12/25/18   [provider]  topiramate (TOPAMAX) 100 MG tablet Take 100 mg by mouth 2 (two) times daily. 05/18/19   [provider]  XIIDRA 5 % SOLN Place 2 drops into both eyes daily.  01/21/19   [provider]    Allergies    Ciprofloxacin  Review of Systems   Review of Systems  Physical Exam Updated Vital Signs BP 127/74   Pulse 75   Temp (P) 98.1 F (36.7 C) (Oral)   Resp (P) 18   Ht 6' (1.829 m)  Wt 83.9 kg   SpO2 95%   BMI 25.09 kg/m   Physical Exam  ED Results / Procedures / Treatments   Labs (all labs ordered are listed, but only abnormal results are displayed) Labs Reviewed  CBC WITH DIFFERENTIAL/PLATELET - Abnormal; Notable for the following components:      Result Value   WBC 12.5 (*)    RDW 15.9 (*)    Lymphs Abs 4.4 (*)    Monocytes Absolute 1.1 (*)    All other components within normal limits  COMPREHENSIVE METABOLIC PANEL - Abnormal; Notable for the following components:   Chloride 97 (*)    Glucose, Bld 487 (*)    BUN 24 (*)    Creatinine, Ser 1.27 (*)    Alkaline Phosphatase 173 (*)    All other components within normal limits  URINALYSIS, ROUTINE W REFLEX MICROSCOPIC - Abnormal; Notable for the following components:   Color, Urine STRAW (*)    Glucose, UA >=500 (*)    Ketones, ur 20 (*)    All other components within normal limits  BETA-HYDROXYBUTYRIC ACID - Abnormal; Notable for the following components:   Beta-Hydroxybutyric Acid 1.94 (*)    All other components within normal limits  CBG MONITORING, ED - Abnormal; Notable for the following components:   Glucose-Capillary >600 (*)    All other components within normal limits  CBG MONITORING, ED - Abnormal; Notable for the following components:   Glucose-Capillary 512 (*)    All other components within normal limits  CBG MONITORING, ED - Abnormal; Notable for the following components:    Glucose-Capillary 261 (*)    All other components within normal limits    EKG None  Radiology CT Renal Stone Study  Addendum Date: 07/31/2020   ADDENDUM REPORT: 07/31/2020 23:00 ADDENDUM: Please note there is apparent area of discontinuity of the surgical suture in the distal stomach (17/2). There are several small pockets of air adjacent to this area which may be intraluminal or extraluminal. Evaluation for possibility of sutural fracture or dehiscence is very limited on this noncontrast CT. CT with oral contrast may provide better evaluation if clinically head. No drainable fluid collection identified. Electronically Signed   By: Elgie CollardArash  Radparvar M.D.   On: 07/31/2020 23:00   Result Date: 07/31/2020 CLINICAL DATA:  34 year old male with flank pain. Concern for kidney stone. EXAM: CT ABDOMEN AND PELVIS WITHOUT CONTRAST TECHNIQUE: Multidetector CT imaging of the abdomen and pelvis was performed following the standard protocol without IV contrast. COMPARISON:  CT abdomen pelvis dated 05/15/2019. FINDINGS: Evaluation of this exam is limited in the absence of intravenous contrast. Lower chest: Minimal bibasilar subpleural scarring. The visualized lung bases are otherwise clear. No intra-abdominal free air or free fluid. Hepatobiliary: The liver is unremarkable. No intrahepatic biliary ductal dilatation. Cholecystectomy. No retained calcified stone noted in the central CBD. Pancreas: The pancreas is suboptimally visualized and not evaluated. Spleen: Splenectomy. Adrenals/Urinary Tract: The adrenal glands are unremarkable. There are multiple bilateral renal nonobstructing calculi measuring up to 6 mm in the inferior pole of the right kidney. There is no hydronephrosis on either side. The visualized ureters and urinary bladder appear unremarkable. Stomach/Bowel: Evaluation of the bowel is limited in the absence of oral contrast. There is postsurgical changes of the bowel with anastomotic sutures involving the  stomach. There is stranding of the upper abdominal fat, likely related to recent surgery. There is moderate amount of stool throughout the colon. There is no bowel obstruction. The appendix is normal.  Vascular/Lymphatic: The abdominal aorta and IVC unremarkable. No portal venous gas. Tiny string of air anterior to the main portal vein, likely within the biliary tree (series 2 images 9-12). There is no adenopathy. Reproductive: The prostate and seminal vesicles are grossly unremarkable. Other: Midline vertical anterior abdominal wall surgical incision. No fluid collection. Musculoskeletal: No acute or significant osseous findings. IMPRESSION: 1. Multiple bilateral renal nonobstructing calculi. No hydronephrosis or obstructing stone. 2. Postsurgical changes of the bowel. No bowel obstruction. Normal appendix. 3. Mild upper abdominal mesenteric stranding, likely postsurgical. No fluid collection. 4. Tiny string of air anterior to the main portal vein, likely within the biliary tree. Electronically Signed: By: Elgie Collard M.D. On: 07/31/2020 22:32    Procedures Procedures (including critical care time)  Medications Ordered in ED Medications  sodium chloride 0.9 % bolus 2,000 mL (2,000 mLs Intravenous New Bag/Given (Non-Interop) 07/31/20 2136)  insulin aspart (novoLOG) injection 10 Units (10 Units Intravenous Given 07/31/20 2137)  insulin aspart (novoLOG) injection 15 Units (15 Units Subcutaneous Given 07/31/20 2137)    ED Course  I have reviewed the triage vital signs and the nursing notes.  Pertinent labs & imaging results that were available during my care of the patient were reviewed by me and considered in my medical decision making (see chart for details).    MDM Rules/Calculators/A&P                          Patient with elevated glucose and dehydration.  Patient improved with fluids and insulin Final Clinical Impression(s) / ED Diagnoses Final diagnoses:  None    Rx / DC Orders ED  Discharge Orders    None       Bethann Berkshire, MD 07/31/20 2319

## 2020-07-31 NOTE — Discharge Instructions (Addendum)
Follow-up with your doctor this week for recheck. 

## 2020-08-20 NOTE — ED Provider Notes (Signed)
[facility] EMERGENCY DEPARTMENT Provider Note   CSN: [id number] Arrival date & time: 07/31/20  1650     History Chief Complaint  Patient presents with  . Hyperglycemia    Dennis Yang is a 34 y.o. male.  Patient has a history of diabetes and has felt weak lately his sugar has been elevated  The history is provided by the patient and medical records. No language interpreter was used.  Hyperglycemia Severity:  Moderate Onset quality:  Sudden Timing:  Constant Progression:  Worsening Chronicity:  New Diabetes status:  Controlled with insulin Associated symptoms: weakness   Associated symptoms: no abdominal pain, no chest pain and no fatigue        Past Medical History:  Diagnosis Date  . Anxiety   . Back pain   . Bipolar 1 disorder (HCC)   . Depression   . Diabetes mellitus without complication (HCC)   . Incontinence of bowel   . Incontinence of urine   . Neck pain   . Neuropathy   . Osteoarthritis   . Osteonecrosis (HCC)    left foot  . Pancreatitis   . Schizoaffective disorder (HCC)   . TIA (transient ischemic attack)     Patient Active Problem List   Diagnosis Date Noted  . GERD (gastroesophageal reflux disease) 12/16/2019  . HTN (hypertension) 12/16/2019  . Hypokalemia 12/16/2019  . Acute on chronic pancreatitis (HCC) 12/16/2019  . Acute on chronic/recurrent pancreatitis 08/12/2019  . Acute on chronic/recurrent pancreatitis 01/27/2019  . Bipolar 1 disorder (HCC) 01/27/2019  . Depression with anxiety 01/27/2019  . Type 2 diabetes mellitus without complication (HCC) 01/27/2019    Past Surgical History:  Procedure Laterality Date  . botox injections in bladder    . CHOLECYSTECTOMY    . ELBOW FRACTURE SURGERY    . FOOT SURGERY    . HERNIA REPAIR    . LITHOTRIPSY    . NASAL SINUS SURGERY         No family history on file.  Social History   Tobacco Use  . Smoking status: Former Games developer  . Smokeless tobacco: Never Used  Vaping Use  .  Vaping Use: Never used  Substance Use Topics  . Alcohol use: Not Currently  . Drug use: Not Currently    Home Medications Prior to Admission medications   Medication Sig Start Date End Date Taking? Authorizing Provider  acetaminophen (TYLENOL) 325 MG tablet Take 2 tablets (650 mg total) by mouth every 6 (six) hours as needed for mild pain, fever or headache (or Fever >/= 101). 08/13/19   Emokpae, Courage, MD  ARIPiprazole (ABILIFY) 15 MG tablet Take 15 mg by mouth daily.    [provider]  ATROVENT HFA 17 MCG/ACT inhaler Inhale 1 puff into the lungs every 4 (four) hours as needed for wheezing.  01/16/19   [provider]  celecoxib (CELEBREX) 100 MG capsule Take 100 mg by mouth 2 (two) times daily. 12/25/19   [provider]  cetirizine (ZYRTEC) 10 MG tablet Take 10 mg by mouth daily. 11/16/19   [provider]  chlorhexidine (HIBICLENS) 4 % external liquid Apply 1 application topically daily as needed (for skin irritation as directed).  01/13/20   [provider]  Cholecalciferol (VITAMIN D3) 1.25 MG (50000 UT) CAPS Take 1 capsule by mouth every Monday, Wednesday, and Friday. 3 times a week 12/24/18   [provider]  CREON 36000 units CPEP capsule Take 36,000-72,000 Units by mouth See admin instructions. Take 2  capsules by mouth with meals and 1 capsule with snacks 11/16/19   [provider]  diclofenac Sodium (VOLTAREN) 1 % GEL Apply 2 g topically daily as needed (for pain).  11/23/19   [provider]  doxycycline (VIBRA-TABS) 100 MG tablet Take 100 mg by mouth 2 (two) times daily. 01/13/20   [provider]  EMGALITY 120 MG/ML SOAJ Inject 1 Dose into the muscle every 30 (thirty) days.  11/24/18   [provider]  escitalopram (LEXAPRO) 20 MG tablet Take 20 mg by mouth daily.  01/22/19   [provider]  famotidine (PEPCID) 20 MG tablet Take 20 mg by mouth 3 (three) times daily as needed for heartburn or  indigestion.  11/16/19   [provider]  FARXIGA 10 MG TABS tablet Take 10 mg by mouth daily.  01/07/19   [provider]  fluticasone (FLONASE) 50 MCG/ACT nasal spray Place 1 spray into both nostrils daily.  01/20/19   [provider]  glipiZIDE (GLUCOTROL) 10 MG tablet Take 10 mg by mouth 2 (two) times daily. 01/19/19   [provider]  Insulin Detemir (LEVEMIR FLEXTOUCH) 100 UNIT/ML Pen Inject 30 Units into the skin 2 (two) times daily. 30 units in the morning and 30 units at bedtime 03/31/19   [provider]  lidocaine (XYLOCAINE) 4 % external solution Apply topically daily as needed for mild pain or moderate pain.  11/23/19   [provider]  meclizine (ANTIVERT) 25 MG tablet Take 25 mg by mouth every 6 (six) hours as needed for dizziness or nausea.  10/01/19   [provider]  metaxalone (SKELAXIN) 800 MG tablet Take 800 mg by mouth every 8 (eight) hours as needed for muscle spasms.  12/03/19   [provider]  mirtazapine (REMERON) 30 MG tablet Take 30 mg by mouth at bedtime. 12/09/19   [provider]  montelukast (SINGULAIR) 10 MG tablet Take 10 mg by mouth daily.  01/12/19   [provider]  nitroGLYCERIN (NITROSTAT) 0.4 MG SL tablet Place 0.4 mg under the tongue every 5 (five) minutes as needed for chest pain.  08/18/19   [provider]  NOVOLOG FLEXPEN 100 UNIT/ML FlexPen Inject 15 Units into the skin 3 (three) times daily as needed for high blood sugar.  04/27/19   [provider]  omeprazole (PRILOSEC) 40 MG capsule Take 1 capsule (40 mg total) by mouth daily. 12/19/19   Shon HaleEmokpae, Courage, MD  ondansetron (ZOFRAN-ODT) 8 MG disintegrating tablet Take 1 tablet (8 mg total) by mouth 2 (two) times daily as needed for nausea or vomiting. Patient taking differently: Take 8 mg by mouth 3 (three) times daily before meals.  12/19/19   Shon HaleEmokpae, Courage, MD  prazosin (MINIPRESS) 2 MG capsule Take 1  capsule (2 mg total) by mouth at bedtime. Patient taking differently: Take 2 mg by mouth every morning.  08/13/19   Shon HaleEmokpae, Courage, MD  pregabalin (LYRICA) 200 MG capsule Take 200 mg by mouth 2 (two) times daily.    [provider]  propranolol (INDERAL) 40 MG tablet Take 40 mg by mouth 2 (two) times daily.  01/12/19   [provider]  simvastatin (ZOCOR) 20 MG tablet Take 10 mg by mouth daily at 6 PM.  12/25/18   [provider]  topiramate (TOPAMAX) 100 MG tablet Take 100 mg by mouth 2 (two) times daily. 05/18/19   [provider]  XIIDRA 5 % SOLN Place 2 drops into both eyes daily.  01/21/19   [provider]    Allergies    Ciprofloxacin  Review of Systems   Review of Systems  Constitutional: Negative for appetite change and fatigue.  HENT: Negative for congestion, ear discharge and sinus pressure.   Eyes: Negative for discharge.  Respiratory: Negative for cough.   Cardiovascular: Negative for chest pain.  Gastrointestinal: Negative for abdominal pain and diarrhea.  Genitourinary: Negative for frequency and hematuria.  Musculoskeletal: Negative for back pain.  Skin: Negative for rash.  Neurological: Positive for weakness. Negative for seizures and headaches.  Psychiatric/Behavioral: Negative for hallucinations.    Physical Exam Updated Vital Signs BP 104/66   Pulse 75   Temp (P) 98.1 F (36.7 C) (Oral)   Resp (P) 18   Ht 6' (1.829 m)   Wt 83.9 kg   SpO2 95%   BMI 25.09 kg/m   Physical Exam Vitals and nursing note reviewed.  Constitutional:      Appearance: He is well-developed.  HENT:     Head: Normocephalic.     Nose: Nose normal.  Eyes:     General: No scleral icterus.    Conjunctiva/sclera: Conjunctivae normal.  Neck:     Thyroid: No thyromegaly.  Cardiovascular:     Rate and Rhythm: Normal rate and regular rhythm.     Heart sounds: No murmur heard.  No friction rub. No gallop.   Pulmonary:     Breath sounds: No  stridor. No wheezing or rales.  Chest:     Chest wall: No tenderness.  Abdominal:     General: There is no distension.     Tenderness: There is no abdominal tenderness. There is no rebound.  Musculoskeletal:        General: Normal range of motion.     Cervical back: Neck supple.  Lymphadenopathy:     Cervical: No cervical adenopathy.  Skin:    Findings: No erythema or rash.  Neurological:     Mental Status: He is oriented to person, place, and time.     Motor: No abnormal muscle tone.     Coordination: Coordination normal.  Psychiatric:        Behavior: Behavior normal.     ED Results / Procedures / Treatments   Labs (all labs ordered are listed, but only abnormal results are displayed) Labs Reviewed  CBC WITH DIFFERENTIAL/PLATELET - Abnormal; Notable for the following components:      Result Value   WBC 12.5 (*)    RDW 15.9 (*)    Lymphs Abs 4.4 (*)    Monocytes Absolute 1.1 (*)    All other components within normal limits  COMPREHENSIVE METABOLIC PANEL - Abnormal; Notable for the following components:   Chloride 97 (*)    Glucose, Bld 487 (*)    BUN 24 (*)    Creatinine, Ser 1.27 (*)    Alkaline Phosphatase 173 (*)    All other components within normal limits  URINALYSIS, ROUTINE W REFLEX MICROSCOPIC - Abnormal; Notable for the following components:   Color, Urine STRAW (*)    Glucose, UA >=500 (*)    Ketones, ur 20 (*)    All other components within normal limits  BETA-HYDROXYBUTYRIC ACID - Abnormal; Notable for the following components:   Beta-Hydroxybutyric Acid 1.94 (*)    All other components within normal limits  CBG MONITORING, ED - Abnormal; Notable for the following components:   Glucose-Capillary >600 (*)    All other components within normal limits  CBG MONITORING, ED -  Abnormal; Notable for the following components:   Glucose-Capillary 512 (*)    All other components within normal limits  CBG MONITORING, ED - Abnormal; Notable for the following  components:   Glucose-Capillary 261 (*)    All other components within normal limits    EKG None  Radiology No results found.  Procedures Procedures (including critical care time)  Medications Ordered in ED Medications  sodium chloride 0.9 % bolus 2,000 mL (0 mLs Intravenous Stopped 08/01/20 0058)  insulin aspart (novoLOG) injection 10 Units (10 Units Intravenous Given 07/31/20 2137)  insulin aspart (novoLOG) injection 15 Units (15 Units Subcutaneous Given 07/31/20 2137)    ED Course  I have reviewed the triage vital signs and the nursing notes.  Pertinent labs & imaging results that were available during my care of the patient were reviewed by me and considered in my medical decision making (see chart for details).    MDM Rules/Calculators/A&P                         Patient with dehydration elevated sugar.  Patient improved with fluids and insulin      This patient presents to the ED for concern of weakness and elevated sugar, this involves an extensive number of treatment options, and is a complaint that carries with it a high risk of complications and morbidity.  The differential diagnosis includes poorly controlled diabetes   Lab Tests:   I Ordered, reviewed, and interpreted labs, which included CBC and chemistries that showed his white count elevated and his sugar elevated  Medicines ordered:   I ordered medication insulin and normal saline  Imaging Studies ordered:   I ordered imaging studies which included CT abdomen  I independently visualized and interpreted imaging which showed no acute disease  Additional history obtained:   Additional history obtained from record  Previous records obtained and reviewed.  Consultations Obtained:     Reevaluation:  After the interventions stated above, I reevaluated the patient and found improved  Critical Interventions:  .   Final Clinical Impression(s) / ED Diagnoses Final diagnoses:    Hyperglycemia    Rx / DC Orders ED Discharge Orders    None       Bethann Berkshire, MD 08/20/20 1553

## 2021-07-13 ENCOUNTER — Emergency Department (HOSPITAL_COMMUNITY): Payer: Medicare Other

## 2021-07-13 ENCOUNTER — Inpatient Hospital Stay (HOSPITAL_COMMUNITY)
Admission: EM | Admit: 2021-07-13 | Discharge: 2021-07-18 | DRG: 101 | Disposition: A | Discharge status: Home / Self Care | Payer: Medicare Other | Attending: Family Medicine | Admitting: Family Medicine

## 2021-07-13 ENCOUNTER — Other Ambulatory Visit: Payer: Self-pay

## 2021-07-13 DIAGNOSIS — G40909 Epilepsy, unspecified, not intractable, without status epilepticus: Secondary | ICD-10-CM | POA: Diagnosis not present

## 2021-07-13 DIAGNOSIS — E1142 Type 2 diabetes mellitus with diabetic polyneuropathy: Secondary | ICD-10-CM | POA: Diagnosis present

## 2021-07-13 DIAGNOSIS — Z90410 Acquired total absence of pancreas: Secondary | ICD-10-CM

## 2021-07-13 DIAGNOSIS — Z20822 Contact with and (suspected) exposure to covid-19: Secondary | ICD-10-CM | POA: Diagnosis present

## 2021-07-13 DIAGNOSIS — Z794 Long term (current) use of insulin: Secondary | ICD-10-CM

## 2021-07-13 DIAGNOSIS — Z7951 Long term (current) use of inhaled steroids: Secondary | ICD-10-CM

## 2021-07-13 DIAGNOSIS — Z9049 Acquired absence of other specified parts of digestive tract: Secondary | ICD-10-CM

## 2021-07-13 DIAGNOSIS — E1165 Type 2 diabetes mellitus with hyperglycemia: Secondary | ICD-10-CM | POA: Diagnosis present

## 2021-07-13 DIAGNOSIS — Z8249 Family history of ischemic heart disease and other diseases of the circulatory system: Secondary | ICD-10-CM

## 2021-07-13 DIAGNOSIS — F319 Bipolar disorder, unspecified: Secondary | ICD-10-CM | POA: Diagnosis present

## 2021-07-13 DIAGNOSIS — N390 Urinary tract infection, site not specified: Secondary | ICD-10-CM | POA: Diagnosis present

## 2021-07-13 DIAGNOSIS — K219 Gastro-esophageal reflux disease without esophagitis: Secondary | ICD-10-CM | POA: Diagnosis present

## 2021-07-13 DIAGNOSIS — G40919 Epilepsy, unspecified, intractable, without status epilepticus: Secondary | ICD-10-CM | POA: Diagnosis present

## 2021-07-13 DIAGNOSIS — E860 Dehydration: Secondary | ICD-10-CM | POA: Diagnosis present

## 2021-07-13 DIAGNOSIS — F259 Schizoaffective disorder, unspecified: Secondary | ICD-10-CM | POA: Diagnosis present

## 2021-07-13 DIAGNOSIS — E119 Type 2 diabetes mellitus without complications: Secondary | ICD-10-CM

## 2021-07-13 DIAGNOSIS — N3941 Urge incontinence: Secondary | ICD-10-CM | POA: Diagnosis present

## 2021-07-13 DIAGNOSIS — Z87891 Personal history of nicotine dependence: Secondary | ICD-10-CM

## 2021-07-13 DIAGNOSIS — N179 Acute kidney failure, unspecified: Secondary | ICD-10-CM | POA: Diagnosis present

## 2021-07-13 DIAGNOSIS — F4310 Post-traumatic stress disorder, unspecified: Secondary | ICD-10-CM | POA: Diagnosis present

## 2021-07-13 DIAGNOSIS — I10 Essential (primary) hypertension: Secondary | ICD-10-CM | POA: Diagnosis present

## 2021-07-13 DIAGNOSIS — M879 Osteonecrosis, unspecified: Secondary | ICD-10-CM | POA: Diagnosis present

## 2021-07-13 DIAGNOSIS — Z791 Long term (current) use of non-steroidal anti-inflammatories (NSAID): Secondary | ICD-10-CM

## 2021-07-13 DIAGNOSIS — Z62810 Personal history of physical and sexual abuse in childhood: Secondary | ICD-10-CM | POA: Diagnosis present

## 2021-07-13 DIAGNOSIS — G8929 Other chronic pain: Secondary | ICD-10-CM | POA: Diagnosis present

## 2021-07-13 DIAGNOSIS — Z8673 Personal history of transient ischemic attack (TIA), and cerebral infarction without residual deficits: Secondary | ICD-10-CM

## 2021-07-13 DIAGNOSIS — R569 Unspecified convulsions: Secondary | ICD-10-CM

## 2021-07-13 DIAGNOSIS — W19XXXA Unspecified fall, initial encounter: Secondary | ICD-10-CM

## 2021-07-13 DIAGNOSIS — Z79899 Other long term (current) drug therapy: Secondary | ICD-10-CM

## 2021-07-13 DIAGNOSIS — E876 Hypokalemia: Secondary | ICD-10-CM

## 2021-07-13 DIAGNOSIS — R739 Hyperglycemia, unspecified: Secondary | ICD-10-CM

## 2021-07-13 LAB — URINALYSIS, ROUTINE W REFLEX MICROSCOPIC
Bilirubin Urine: NEGATIVE
Glucose, UA: 150 mg/dL — AB
Hgb urine dipstick: NEGATIVE
Ketones, ur: NEGATIVE mg/dL
Nitrite: NEGATIVE
Protein, ur: NEGATIVE mg/dL
Specific Gravity, Urine: 1.014 (ref 1.005–1.030)
pH: 6 (ref 5.0–8.0)

## 2021-07-13 LAB — RAPID URINE DRUG SCREEN, HOSP PERFORMED
Amphetamines: NOT DETECTED
Barbiturates: NOT DETECTED
Benzodiazepines: NOT DETECTED
Cocaine: NOT DETECTED
Opiates: NOT DETECTED
Tetrahydrocannabinol: POSITIVE — AB

## 2021-07-13 LAB — CBC WITH DIFFERENTIAL/PLATELET
Abs Immature Granulocytes: 0.14 10*3/uL — ABNORMAL HIGH (ref 0.00–0.07)
Basophils Absolute: 0.1 10*3/uL (ref 0.0–0.1)
Basophils Relative: 0 %
Eosinophils Absolute: 0.3 10*3/uL (ref 0.0–0.5)
Eosinophils Relative: 2 %
HCT: 32.5 % — ABNORMAL LOW (ref 39.0–52.0)
Hemoglobin: 11.6 g/dL — ABNORMAL LOW (ref 13.0–17.0)
Immature Granulocytes: 1 %
Lymphocytes Relative: 23 %
Lymphs Abs: 4.8 10*3/uL — ABNORMAL HIGH (ref 0.7–4.0)
MCH: 31.4 pg (ref 26.0–34.0)
MCHC: 35.7 g/dL (ref 30.0–36.0)
MCV: 87.8 fL (ref 80.0–100.0)
Monocytes Absolute: 3 10*3/uL — ABNORMAL HIGH (ref 0.1–1.0)
Monocytes Relative: 15 %
Neutro Abs: 12.2 10*3/uL — ABNORMAL HIGH (ref 1.7–7.7)
Neutrophils Relative %: 59 %
Platelets: 177 10*3/uL (ref 150–400)
RBC: 3.7 MIL/uL — ABNORMAL LOW (ref 4.22–5.81)
RDW: 12 % (ref 11.5–15.5)
WBC: 20.5 10*3/uL — ABNORMAL HIGH (ref 4.0–10.5)
nRBC: 0 % (ref 0.0–0.2)

## 2021-07-13 LAB — COMPREHENSIVE METABOLIC PANEL
ALT: 20 U/L (ref 0–44)
AST: 29 U/L (ref 15–41)
Albumin: 3.5 g/dL (ref 3.5–5.0)
Alkaline Phosphatase: 111 U/L (ref 38–126)
Anion gap: 7 (ref 5–15)
BUN: 28 mg/dL — ABNORMAL HIGH (ref 6–20)
CO2: 23 mmol/L (ref 22–32)
Calcium: 8.6 mg/dL — ABNORMAL LOW (ref 8.9–10.3)
Chloride: 103 mmol/L (ref 98–111)
Creatinine, Ser: 1.7 mg/dL — ABNORMAL HIGH (ref 0.61–1.24)
GFR, Estimated: 54 mL/min — ABNORMAL LOW (ref >60)
Glucose, Bld: 244 mg/dL — ABNORMAL HIGH (ref 70–99)
Potassium: 3.1 mmol/L — ABNORMAL LOW (ref 3.5–5.1)
Sodium: 133 mmol/L — ABNORMAL LOW (ref 135–145)
Total Bilirubin: 1 mg/dL (ref 0.3–1.2)
Total Protein: 6.4 g/dL — ABNORMAL LOW (ref 6.5–8.1)

## 2021-07-13 LAB — LIPASE, BLOOD: Lipase: 15 U/L (ref 11–51)

## 2021-07-13 LAB — GLUCOSE, CAPILLARY: Glucose-Capillary: 149 mg/dL — ABNORMAL HIGH (ref 70–99)

## 2021-07-13 LAB — CBG MONITORING, ED: Glucose-Capillary: 176 mg/dL — ABNORMAL HIGH (ref 70–99)

## 2021-07-13 LAB — MAGNESIUM: Magnesium: 2.1 mg/dL (ref 1.7–2.4)

## 2021-07-13 MED ORDER — CELECOXIB 100 MG PO CAPS
100.0000 mg | ORAL_CAPSULE | Freq: Two times a day (BID) | ORAL | Status: DC
  Administered 2021-07-13: 100 mg via ORAL
  Filled 2021-07-13: qty 1

## 2021-07-13 MED ORDER — LORAZEPAM 2 MG/ML IJ SOLN
1.0000 mg | INTRAMUSCULAR | Status: DC | PRN
  Administered 2021-07-16: 1 mg via INTRAVENOUS
  Filled 2021-07-13: qty 1

## 2021-07-13 MED ORDER — POLYETHYLENE GLYCOL 3350 17 G PO PACK
17.0000 g | PACK | Freq: Every day | ORAL | Status: DC | PRN
  Administered 2021-07-14: 17 g via ORAL
  Filled 2021-07-13: qty 1

## 2021-07-13 MED ORDER — ONDANSETRON HCL 4 MG/2ML IJ SOLN: 4.0000 mg | Freq: Four times a day (QID) | INTRAMUSCULAR | Status: DC | PRN

## 2021-07-13 MED ORDER — PANTOPRAZOLE SODIUM 40 MG PO TBEC
40.0000 mg | DELAYED_RELEASE_TABLET | Freq: Every day | ORAL | Status: DC
  Administered 2021-07-13 – 2021-07-18 (×6): 40 mg via ORAL
  Filled 2021-07-13 (×6): qty 1

## 2021-07-13 MED ORDER — GALCANEZUMAB-GNLM 120 MG/ML ~~LOC~~ SOAJ: 1.0000 | SUBCUTANEOUS | Status: DC

## 2021-07-13 MED ORDER — SIMVASTATIN 20 MG PO TABS
10.0000 mg | ORAL_TABLET | Freq: Every day | ORAL | Status: DC
  Administered 2021-07-14 – 2021-07-17 (×4): 10 mg via ORAL
  Filled 2021-07-13 (×4): qty 1

## 2021-07-13 MED ORDER — SODIUM CHLORIDE 0.9 % IV SOLN: 250.0000 mL | INTRAVENOUS | Status: DC | PRN

## 2021-07-13 MED ORDER — INSULIN ASPART 100 UNIT/ML IJ SOLN: 0.0000 [IU] | Freq: Every day | INTRAMUSCULAR | Status: DC

## 2021-07-13 MED ORDER — SODIUM CHLORIDE 0.9 % IV SOLN: INTRAVENOUS | Status: DC

## 2021-07-13 MED ORDER — LEVETIRACETAM IN NACL 1000 MG/100ML IV SOLN
1000.0000 mg | Freq: Once | INTRAVENOUS | Status: AC
  Administered 2021-07-13: 1000 mg via INTRAVENOUS
  Filled 2021-07-13: qty 100

## 2021-07-13 MED ORDER — FLUTICASONE PROPIONATE 50 MCG/ACT NA SUSP
1.0000 | Freq: Every day | NASAL | Status: DC
  Administered 2021-07-13 – 2021-07-18 (×6): 1 via NASAL
  Filled 2021-07-13 (×2): qty 16

## 2021-07-13 MED ORDER — NITROGLYCERIN 0.4 MG SL SUBL: 0.4000 mg | SUBLINGUAL_TABLET | SUBLINGUAL | Status: DC | PRN

## 2021-07-13 MED ORDER — METAXALONE 800 MG PO TABS: 800.0000 mg | ORAL_TABLET | Freq: Three times a day (TID) | ORAL | Status: DC | PRN

## 2021-07-13 MED ORDER — ENOXAPARIN SODIUM 40 MG/0.4ML IJ SOSY
40.0000 mg | PREFILLED_SYRINGE | INTRAMUSCULAR | Status: DC
  Administered 2021-07-13 – 2021-07-17 (×5): 40 mg via SUBCUTANEOUS
  Filled 2021-07-13 (×5): qty 0.4

## 2021-07-13 MED ORDER — POTASSIUM CHLORIDE CRYS ER 20 MEQ PO TBCR
40.0000 meq | EXTENDED_RELEASE_TABLET | Freq: Once | ORAL | Status: AC
  Administered 2021-07-13: 40 meq via ORAL
  Filled 2021-07-13: qty 2

## 2021-07-13 MED ORDER — BISACODYL 10 MG RE SUPP: 10.0000 mg | Freq: Every day | RECTAL | Status: DC | PRN

## 2021-07-13 MED ORDER — LEVETIRACETAM IN NACL 1000 MG/100ML IV SOLN: 1000.0000 mg | Freq: Two times a day (BID) | INTRAVENOUS | Status: DC

## 2021-07-13 MED ORDER — ONDANSETRON HCL 4 MG PO TABS: 4.0000 mg | ORAL_TABLET | Freq: Four times a day (QID) | ORAL | Status: DC | PRN

## 2021-07-13 MED ORDER — POTASSIUM CHLORIDE CRYS ER 20 MEQ PO TBCR
40.0000 meq | EXTENDED_RELEASE_TABLET | ORAL | Status: AC
  Administered 2021-07-13 (×2): 40 meq via ORAL
  Filled 2021-07-13 (×2): qty 2

## 2021-07-13 MED ORDER — INSULIN GLARGINE-YFGN 100 UNIT/ML ~~LOC~~ SOLN
35.0000 [IU] | Freq: Every day | SUBCUTANEOUS | Status: DC
  Administered 2021-07-13: 35 [IU] via SUBCUTANEOUS
  Filled 2021-07-13: qty 0.35

## 2021-07-13 MED ORDER — INSULIN ASPART 100 UNIT/ML FLEXPEN
15.0000 [IU] | PEN_INJECTOR | Freq: Three times a day (TID) | SUBCUTANEOUS | Status: DC | PRN
  Filled 2021-07-13: qty 3

## 2021-07-13 MED ORDER — PREGABALIN 75 MG PO CAPS
200.0000 mg | ORAL_CAPSULE | Freq: Two times a day (BID) | ORAL | Status: DC
  Administered 2021-07-13: 200 mg via ORAL
  Filled 2021-07-13: qty 1

## 2021-07-13 MED ORDER — PANCRELIPASE (LIP-PROT-AMYL) 12000-38000 UNITS PO CPEP
72000.0000 [IU] | ORAL_CAPSULE | Freq: Three times a day (TID) | ORAL | Status: DC
  Administered 2021-07-14 – 2021-07-18 (×13): 72000 [IU] via ORAL
  Filled 2021-07-13 (×14): qty 6

## 2021-07-13 MED ORDER — ACETAMINOPHEN 650 MG RE SUPP: 650.0000 mg | Freq: Four times a day (QID) | RECTAL | Status: DC | PRN

## 2021-07-13 MED ORDER — ACETAMINOPHEN 325 MG PO TABS
650.0000 mg | ORAL_TABLET | Freq: Four times a day (QID) | ORAL | Status: DC | PRN
  Administered 2021-07-14 – 2021-07-17 (×4): 650 mg via ORAL
  Filled 2021-07-13 (×4): qty 2

## 2021-07-13 MED ORDER — LORATADINE 10 MG PO TABS
10.0000 mg | ORAL_TABLET | Freq: Every day | ORAL | Status: DC
  Administered 2021-07-13 – 2021-07-18 (×6): 10 mg via ORAL
  Filled 2021-07-13 (×6): qty 1

## 2021-07-13 MED ORDER — MONTELUKAST SODIUM 10 MG PO TABS
10.0000 mg | ORAL_TABLET | Freq: Every day | ORAL | Status: DC
  Administered 2021-07-13 – 2021-07-18 (×6): 10 mg via ORAL
  Filled 2021-07-13 (×6): qty 1

## 2021-07-13 MED ORDER — MECLIZINE HCL 12.5 MG PO TABS
25.0000 mg | ORAL_TABLET | Freq: Four times a day (QID) | ORAL | Status: DC | PRN
  Administered 2021-07-16: 25 mg via ORAL
  Filled 2021-07-13: qty 2

## 2021-07-13 MED ORDER — SODIUM CHLORIDE 0.9 % IV SOLN
INTRAVENOUS | Status: DC
  Administered 2021-07-16: 50 mL/h via INTRAVENOUS

## 2021-07-13 MED ORDER — VITAMIN D (ERGOCALCIFEROL) 1.25 MG (50000 UNIT) PO CAPS
50000.0000 [IU] | ORAL_CAPSULE | ORAL | Status: DC
  Administered 2021-07-14 – 2021-07-17 (×2): 50000 [IU] via ORAL
  Filled 2021-07-13 (×2): qty 1

## 2021-07-13 MED ORDER — MIRTAZAPINE 30 MG PO TABS
30.0000 mg | ORAL_TABLET | Freq: Every day | ORAL | Status: DC
  Administered 2021-07-13 – 2021-07-17 (×5): 30 mg via ORAL
  Filled 2021-07-13 (×2): qty 1
  Filled 2021-07-13: qty 2
  Filled 2021-07-13 (×2): qty 1

## 2021-07-13 MED ORDER — ADULT MULTIVITAMIN W/MINERALS CH
1.0000 | ORAL_TABLET | Freq: Every day | ORAL | Status: DC
  Administered 2021-07-13 – 2021-07-18 (×6): 1 via ORAL
  Filled 2021-07-13 (×6): qty 1

## 2021-07-13 MED ORDER — SODIUM CHLORIDE 0.9% FLUSH
3.0000 mL | Freq: Two times a day (BID) | INTRAVENOUS | Status: DC
  Administered 2021-07-13 – 2021-07-18 (×5): 3 mL via INTRAVENOUS

## 2021-07-13 MED ORDER — SODIUM CHLORIDE 0.9 % IV SOLN
1.0000 g | INTRAVENOUS | Status: AC
  Administered 2021-07-13 – 2021-07-15 (×3): 1 g via INTRAVENOUS
  Filled 2021-07-13 (×3): qty 10

## 2021-07-13 MED ORDER — PROPRANOLOL HCL 20 MG PO TABS
40.0000 mg | ORAL_TABLET | Freq: Two times a day (BID) | ORAL | Status: DC
  Administered 2021-07-14 – 2021-07-18 (×8): 40 mg via ORAL
  Filled 2021-07-13: qty 4
  Filled 2021-07-13 (×9): qty 2

## 2021-07-13 MED ORDER — ACETAMINOPHEN 325 MG PO TABS: 650.0000 mg | ORAL_TABLET | Freq: Four times a day (QID) | ORAL | Status: DC | PRN

## 2021-07-13 MED ORDER — ESCITALOPRAM OXALATE 10 MG PO TABS
20.0000 mg | ORAL_TABLET | Freq: Every day | ORAL | Status: DC
  Administered 2021-07-13 – 2021-07-18 (×6): 20 mg via ORAL
  Filled 2021-07-13 (×6): qty 2

## 2021-07-13 MED ORDER — SODIUM CHLORIDE 0.9% FLUSH
3.0000 mL | Freq: Two times a day (BID) | INTRAVENOUS | Status: DC
  Administered 2021-07-13 – 2021-07-14 (×2): 3 mL via INTRAVENOUS

## 2021-07-13 MED ORDER — SODIUM CHLORIDE 0.9% FLUSH: 3.0000 mL | INTRAVENOUS | Status: DC | PRN

## 2021-07-13 MED ORDER — FAMOTIDINE 20 MG PO TABS: 20.0000 mg | ORAL_TABLET | Freq: Three times a day (TID) | ORAL | Status: DC | PRN

## 2021-07-13 MED ORDER — LEVETIRACETAM IN NACL 1000 MG/100ML IV SOLN
1000.0000 mg | Freq: Two times a day (BID) | INTRAVENOUS | Status: DC
  Administered 2021-07-13 – 2021-07-14 (×3): 1000 mg via INTRAVENOUS
  Filled 2021-07-13 (×3): qty 100

## 2021-07-13 MED ORDER — INSULIN ASPART 100 UNIT/ML IJ SOLN
0.0000 [IU] | Freq: Three times a day (TID) | INTRAMUSCULAR | Status: DC
  Administered 2021-07-14: 5 [IU] via SUBCUTANEOUS
  Administered 2021-07-14: 2 [IU] via SUBCUTANEOUS
  Administered 2021-07-15: 1 [IU] via SUBCUTANEOUS
  Administered 2021-07-16 (×2): 3 [IU] via SUBCUTANEOUS
  Administered 2021-07-16: 1 [IU] via SUBCUTANEOUS
  Administered 2021-07-17 (×2): 5 [IU] via SUBCUTANEOUS
  Administered 2021-07-17: 3 [IU] via SUBCUTANEOUS
  Administered 2021-07-18: 1 [IU] via SUBCUTANEOUS

## 2021-07-13 MED ORDER — ARIPIPRAZOLE 5 MG PO TABS
15.0000 mg | ORAL_TABLET | Freq: Every day | ORAL | Status: DC
  Administered 2021-07-13: 15 mg via ORAL
  Filled 2021-07-13: qty 1
  Filled 2021-07-13: qty 3

## 2021-07-13 MED ORDER — TOPIRAMATE 100 MG PO TABS
100.0000 mg | ORAL_TABLET | Freq: Two times a day (BID) | ORAL | Status: DC
  Administered 2021-07-13 – 2021-07-18 (×10): 100 mg via ORAL
  Filled 2021-07-13 (×4): qty 1
  Filled 2021-07-13: qty 4
  Filled 2021-07-13 (×5): qty 1

## 2021-07-13 MED ORDER — PRAZOSIN HCL 1 MG PO CAPS
2.0000 mg | ORAL_CAPSULE | Freq: Every morning | ORAL | Status: DC
  Administered 2021-07-14 – 2021-07-18 (×5): 2 mg via ORAL
  Filled 2021-07-13 (×2): qty 2
  Filled 2021-07-13: qty 1
  Filled 2021-07-13 (×3): qty 2

## 2021-07-13 NOTE — ED Notes (Signed)
Pt given Healthy choice meal and diet ginger ale at this time. No other needs at this time. Will continue to monitor.
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
## 2021-07-13 NOTE — ED Triage Notes (Signed)
Per pt. They fell twice yesterday. Pt. States they had a seizure last night and that they were unable to move for an hour yesterday. Pt. States their arms and legs are still numb from yesterday. Pt. States they hit the back of their head on a their tub last night and that they are seeing spots.

## 2021-07-13 NOTE — ED Provider Notes (Signed)
[facility] EMERGENCY DEPARTMENT Provider Note   CSN: [id number] Arrival date & time: 07/13/21  1456     History Chief Complaint  Patient presents with   Marletta Lor    Dennis Yang is a 35 y.o. male.  HPI He is here for multiple complaints.  He came by private vehicle.  He states that when he awoke this morning he was nauseated so took a Phenergan tablet.  Later he vomited.  As he was vomiting he felt dizzy and fell.  He fell against a tub, hitting the back of his head on it.  Since then he has noticed pain in his head, neck, both arms, both hands, both feet.  He states that his fingers feel numb.  He has not had diarrhea today.  He has not had any other episodes of vomiting.  He feels he may have had a seizure today,"this seems worse."  He has been taking his Keppra and other medicines regularly.  He has not checked his blood sugar today.  He has had COVID vaccines and booster.  No known sick contacts.  He denies fever, chills, cough or shortness of breath.  He has chronic urinary incontinence and had Botox injection into the urinary bladder about 6 weeks ago.  That helped his incontinence for couple weeks, but now it has returned.  He takes Myrbetriq for incontinence.  He denies dysuria, hematuria, urinary frequency.  There are no other known active modifying factors.    Past Medical History:  Diagnosis Date   Anxiety    Back pain    Bipolar 1 disorder (HCC)    Depression    Diabetes mellitus without complication (HCC)    Incontinence of bowel    Incontinence of urine    Neck pain    Neuropathy    Osteoarthritis    Osteonecrosis (HCC)    left foot   Pancreatitis    Schizoaffective disorder (HCC)    TIA (transient ischemic attack)     Patient Active Problem List   Diagnosis Date Noted   Seizure (HCC) 07/13/2021   GERD (gastroesophageal reflux disease) 12/16/2019   HTN (hypertension) 12/16/2019   Hypokalemia 12/16/2019   Acute on chronic pancreatitis (HCC) 12/16/2019   Acute on  chronic/recurrent pancreatitis 08/12/2019   Acute on chronic/recurrent pancreatitis 01/27/2019   Bipolar 1 disorder (HCC) 01/27/2019   Depression with anxiety 01/27/2019   Type 2 diabetes mellitus without complication (HCC) 01/27/2019    Past Surgical History:  Procedure Laterality Date   botox injections in bladder     CHOLECYSTECTOMY     ELBOW FRACTURE SURGERY     FOOT SURGERY     HERNIA REPAIR     LITHOTRIPSY     NASAL SINUS SURGERY         No family history on file.  Social History   Tobacco Use   Smoking status: Former   Smokeless tobacco: Never  Building services engineer Use: Never used  Substance Use Topics   Alcohol use: Not Currently   Drug use: Not Currently    Home Medications Prior to Admission medications   Medication Sig Start Date End Date Taking? Authorizing Provider  acetaminophen (TYLENOL) 325 MG tablet Take 2 tablets (650 mg total) by mouth every 6 (six) hours as needed for mild pain, fever or headache (or Fever >/= 101). 08/13/19   Emokpae, Courage, MD  ARIPiprazole (ABILIFY) 15 MG tablet Take 15 mg by mouth daily.    [provider]  ATROVENT  HFA 17 MCG/ACT inhaler Inhale 1 puff into the lungs every 4 (four) hours as needed for wheezing.  01/16/19   [provider]  celecoxib (CELEBREX) 100 MG capsule Take 100 mg by mouth 2 (two) times daily. 12/25/19   [provider]  cetirizine (ZYRTEC) 10 MG tablet Take 10 mg by mouth daily. 11/16/19   [provider]  chlorhexidine (HIBICLENS) 4 % external liquid Apply 1 application topically daily as needed (for skin irritation as directed).  01/13/20   [provider]  Cholecalciferol (VITAMIN D3) 1.25 MG (50000 UT) CAPS Take 1 capsule by mouth every Monday, Wednesday, and Friday. 3 times a week 12/24/18   [provider]  CREON 36000 units CPEP capsule Take 36,000-72,000 Units by mouth See admin instructions. Take 2 capsules by mouth with meals and 1 capsule with snacks  11/16/19   [provider]  diclofenac Sodium (VOLTAREN) 1 % GEL Apply 2 g topically daily as needed (for pain).  11/23/19   [provider]  doxycycline (VIBRA-TABS) 100 MG tablet Take 100 mg by mouth 2 (two) times daily. 01/13/20   [provider]  EMGALITY 120 MG/ML SOAJ Inject 1 Dose into the muscle every 30 (thirty) days.  11/24/18   [provider]  escitalopram (LEXAPRO) 20 MG tablet Take 20 mg by mouth daily.  01/22/19   [provider]  famotidine (PEPCID) 20 MG tablet Take 20 mg by mouth 3 (three) times daily as needed for heartburn or indigestion.  11/16/19   [provider]  FARXIGA 10 MG TABS tablet Take 10 mg by mouth daily.  01/07/19   [provider]  fluticasone (FLONASE) 50 MCG/ACT nasal spray Place 1 spray into both nostrils daily.  01/20/19   [provider]  glipiZIDE (GLUCOTROL) 10 MG tablet Take 10 mg by mouth 2 (two) times daily. 01/19/19   [provider]  Insulin Detemir (LEVEMIR FLEXTOUCH) 100 UNIT/ML Pen Inject 30 Units into the skin 2 (two) times daily. 30 units in the morning and 30 units at bedtime 03/31/19   [provider]  lidocaine (XYLOCAINE) 4 % external solution Apply topically daily as needed for mild pain or moderate pain.  11/23/19   [provider]  meclizine (ANTIVERT) 25 MG tablet Take 25 mg by mouth every 6 (six) hours as needed for dizziness or nausea.  10/01/19   [provider]  metaxalone (SKELAXIN) 800 MG tablet Take 800 mg by mouth every 8 (eight) hours as needed for muscle spasms.  12/03/19   [provider]  mirtazapine (REMERON) 30 MG tablet Take 30 mg by mouth at bedtime. 12/09/19   [provider]  montelukast (SINGULAIR) 10 MG tablet Take 10 mg by mouth daily.  01/12/19   [provider]  nitroGLYCERIN (NITROSTAT) 0.4 MG SL tablet Place 0.4 mg under the tongue every 5 (five) minutes as needed for chest pain.  08/18/19    [provider]  NOVOLOG FLEXPEN 100 UNIT/ML FlexPen Inject 15 Units into the skin 3 (three) times daily as needed for high blood sugar.  04/27/19   [provider]  omeprazole (PRILOSEC) 40 MG capsule Take 1 capsule (40 mg total) by mouth daily. 12/19/19   Shon Hale, MD  ondansetron (ZOFRAN-ODT) 8 MG disintegrating tablet Take 1 tablet (8 mg total) by mouth 2 (two) times daily as needed for nausea or vomiting. Patient taking differently: Take 8 mg by mouth 3 (three) times daily before meals.  12/19/19  Shon HaleEmokpae, Courage, MD  prazosin (MINIPRESS) 2 MG capsule Take 1 capsule (2 mg total) by mouth at bedtime. Patient taking differently: Take 2 mg by mouth every morning.  08/13/19   Shon HaleEmokpae, Courage, MD  pregabalin (LYRICA) 200 MG capsule Take 200 mg by mouth 2 (two) times daily.    [provider]  propranolol (INDERAL) 40 MG tablet Take 40 mg by mouth 2 (two) times daily.  01/12/19   [provider]  simvastatin (ZOCOR) 20 MG tablet Take 10 mg by mouth daily at 6 PM.  12/25/18   [provider]  topiramate (TOPAMAX) 100 MG tablet Take 100 mg by mouth 2 (two) times daily. 05/18/19   [provider]  XIIDRA 5 % SOLN Place 2 drops into both eyes daily.  01/21/19   [provider]    Allergies    Ciprofloxacin  Review of Systems   Review of Systems  All other systems reviewed and are negative.  Physical Exam Updated Vital Signs BP 106/73   Pulse 72   Temp 98.5 F (36.9 C) (Oral)   Resp 12   Ht 6' (1.829 m)   Wt 85.7 kg   SpO2 98%   BMI 25.63 kg/m   Physical Exam Vitals and nursing note reviewed.  Constitutional:      General: He is not in acute distress.    Appearance: He is well-developed. He is not ill-appearing, toxic-appearing or diaphoretic.  HENT:     Head: Normocephalic and atraumatic.     Right Ear: External ear normal.     Left Ear: External ear normal.     Nose: No congestion or rhinorrhea.     Mouth/Throat:      Pharynx: No oropharyngeal exudate or posterior oropharyngeal erythema.     Comments: Dry oral mucous membranes.  No trismus. Eyes:     Conjunctiva/sclera: Conjunctivae normal.     Pupils: Pupils are equal, round, and reactive to light.  Neck:     Trachea: Phonation normal.  Cardiovascular:     Rate and Rhythm: Normal rate and regular rhythm.     Heart sounds: Normal heart sounds.  Pulmonary:     Effort: Pulmonary effort is normal. No respiratory distress.     Breath sounds: Normal breath sounds. No stridor.  Abdominal:     General: There is no distension.     Palpations: Abdomen is soft.     Tenderness: There is no abdominal tenderness.  Musculoskeletal:        General: No swelling, tenderness, deformity or signs of injury. Normal range of motion.     Cervical back: Normal range of motion and neck supple.  Skin:    General: Skin is warm and dry.  Neurological:     Mental Status: He is alert and oriented to person, place, and time.     Cranial Nerves: No cranial nerve deficit.     Sensory: No sensory deficit.     Motor: No abnormal muscle tone.     Coordination: Coordination normal.     Comments: No dysarthria or aphasia.  Psychiatric:        Mood and Affect: Mood normal.        Behavior: Behavior normal.        Thought Content: Thought content normal.        Judgment: Judgment normal.    ED Results / Procedures / Treatments   Labs (all labs ordered are listed, but only abnormal results are displayed) Labs Reviewed  COMPREHENSIVE METABOLIC PANEL - Abnormal; Notable for the following components:      Result Value   Sodium 133 (*)    Potassium 3.1 (*)    Glucose, Bld 244 (*)    BUN 28 (*)    Creatinine, Ser 1.70 (*)    Calcium 8.6 (*)    Total Protein 6.4 (*)    GFR, Estimated 54 (*)    All other components within normal limits  CBC WITH DIFFERENTIAL/PLATELET - Abnormal; Notable for the following components:   WBC 20.5 (*)    RBC 3.70 (*)    Hemoglobin 11.6 (*)     HCT 32.5 (*)    Neutro Abs 12.2 (*)    Lymphs Abs 4.8 (*)    Monocytes Absolute 3.0 (*)    Abs Immature Granulocytes 0.14 (*)    All other components within normal limits  URINALYSIS, ROUTINE W REFLEX MICROSCOPIC - Abnormal; Notable for the following components:   APPearance HAZY (*)    Glucose, UA 150 (*)    Leukocytes,Ua SMALL (*)    Bacteria, UA RARE (*)    All other components within normal limits  RAPID URINE DRUG SCREEN, HOSP PERFORMED - Abnormal; Notable for the following components:   Tetrahydrocannabinol POSITIVE (*)    All other components within normal limits  SARS CORONAVIRUS 2 (TAT 6-24 HRS)  LIPASE, BLOOD  MAGNESIUM    EKG None  Radiology DG Chest 2 View  Result Date: 07/13/2021 CLINICAL DATA:  Fall EXAM: CHEST - 2 VIEW COMPARISON:  None. FINDINGS: Right Port-A-Cath in place with tip in the SVC. Heart and mediastinal contours are within normal limits. No focal opacities or effusions. No acute bony abnormality. No pneumothorax. IMPRESSION: No active cardiopulmonary disease. Electronically Signed   By: Charlett Nose M.D.   On: 07/13/2021 16:34   CT Head Wo Contrast  Result Date: 07/13/2021 CLINICAL DATA:  Fall EXAM: CT HEAD WITHOUT CONTRAST TECHNIQUE: Contiguous axial images were obtained from the base of the skull through the vertex without intravenous contrast. COMPARISON:  None. FINDINGS: Brain: No acute intracranial abnormality. Specifically, no hemorrhage, hydrocephalus, mass lesion, acute infarction, or significant intracranial injury. Vascular: No hyperdense vessel or unexpected calcification. Skull: No acute calvarial abnormality. Sinuses/Orbits: No acute findings Other: None IMPRESSION: Normal study. Electronically Signed   By: Charlett Nose M.D.   On: 07/13/2021 16:35   CT Cervical Spine Wo Contrast  Result Date: 07/13/2021 CLINICAL DATA:  Fall EXAM: CT CERVICAL SPINE WITHOUT CONTRAST TECHNIQUE: Multidetector CT imaging of the cervical spine was performed without  intravenous contrast. Multiplanar CT image reconstructions were also generated. COMPARISON:  None. FINDINGS: Alignment: Normal Skull base and vertebrae: No acute fracture. No primary bone lesion or focal pathologic process. Soft tissues and spinal canal: No prevertebral fluid or swelling. No visible canal hematoma. Disc levels:  Normal Upper chest: Biapical scarring. Other: None IMPRESSION: No acute bony abnormality. Electronically Signed   By: Charlett Nose M.D.   On: 07/13/2021 16:35    Procedures .Critical Care  Date/Time: 07/13/2021 7:15 PM Performed by: Mancel Bale, MD Authorized by: Mancel Bale, MD   Critical care provider statement:    Critical care time (minutes):  35   Critical care start time:  07/13/2021 3:15 PM   Critical care end time:  07/13/2021 7:15 PM   Critical care time was exclusive of:  Separately billable procedures and treating other patients   Critical care was necessary to treat or prevent imminent or life-threatening deterioration of the following  conditions:  Trauma   Critical care was time spent personally by me on the following activities:  Blood draw for specimens, development of treatment plan with patient or surrogate, discussions with consultants, evaluation of patient's response to treatment, examination of patient, obtaining history from patient or surrogate, ordering and performing treatments and interventions, ordering and review of laboratory studies, pulse oximetry, re-evaluation of patient's condition, review of old charts and ordering and review of radiographic studies   Medications Ordered in ED Medications  0.9 %  sodium chloride infusion ( Intravenous New Bag/Given 07/13/21 1632)  levETIRAcetam (KEPPRA) IVPB 1000 mg/100 mL premix (0 mg Intravenous Stopped 07/13/21 1756)  potassium chloride SA (KLOR-CON) CR tablet 40 mEq (40 mEq Oral Given 07/13/21 1909)    ED Course  I have reviewed the triage vital signs and the nursing notes.  Pertinent labs & imaging  results that were available during my care of the patient were reviewed by me and considered in my medical decision making (see chart for details).    MDM Rules/Calculators/A&P                            Patient Vitals for the past 24 hrs:  BP Temp Temp src Pulse Resp SpO2 Height Weight  07/13/21 1900 106/73 -- -- 72 12 98 % -- --  07/13/21 1830 109/76 -- -- 69 18 98 % -- --  07/13/21 1800 111/73 -- -- 66 11 98 % -- --  07/13/21 1730 104/69 -- -- 80 20 96 % -- --  07/13/21 1700 100/67 -- -- 78 20 96 % -- --  07/13/21 1630 103/68 -- -- 82 (!) 23 96 % -- --  07/13/21 1600 110/62 -- -- 70 (!) 24 98 % -- --  07/13/21 1507 -- -- -- -- -- -- 6' (1.829 m) 85.7 kg  07/13/21 1505 104/67 98.5 F (36.9 C) Oral 89 20 95 % -- --    6:57 PM Reevaluation with update and discussion. After initial assessment and treatment, an updated evaluation reveals clinically remained the same.  Father in the room now and is concerned about multiple falls over the last 2 days.  Patient and father feels like he is falling related to multiple problems including neuropathy, high blood sugar, and general weakness.  Patient states he is due to have an insulin pump placed within the next week.  His endocrinologist has suggested that he see a vascular doctor about "blood flow."  Patient is not known to have had a seizure today but may have.  Patient is somewhat reluctant to go home.  I will contact hospitalist for observation admission. Mancel Bale   Medical Decision Making:  This patient is presenting for evaluation of vomiting, and fall, which does require a range of treatment options, and is a complaint that involves a moderate risk of morbidity and mortality. The differential diagnoses include head injury, neck injury, complication of diabetes, occult infection, metabolic disorder, blood volume disorder. I decided to review old records, and in summary middle-aged male with history of pancreatitis, bipolar disorder,  depression, seizures, insulin-dependent diabetes, GERD, hypertension..  I did not require additional historical information from anyone.  Clinical Laboratory Tests Ordered, included CBC, Metabolic panel, Urinalysis, and urine drug screen, lipase, Keppra level . Review indicates normal except THC in urine, sodium low, potassium low, glucose high, BUN high, creatinine high, calcium low, total protein low, urine with increased white cells, white count high, hemoglobin low.  Radiologic Tests Ordered, included CT head and cervical spine, chest x-ray.  I independently Visualized: Radiograph images, which show no acute abnormalities  Cardiac Monitor Tracing which shows normal sinus rhythm    Critical Interventions-clinical evaluation, laboratory testing, Keppra loading, potassium ordered, IV fluids, observation and reevaluation  After These Interventions, the Patient was reevaluated and was found with fall, without serious injury.  Fall may have been caused by seizure.  Possibly related to overall disability as well, multifactorial including acute illnesses of hyperglycemia, dehydration, as well as neuropathy.  Patient with elevated white count, nonspecific without clear signs for infection.  He has incidental hypokalemia and ongoing hyperglycemia.  Hospitalist will be contacted for observation admission  CRITICAL CARE-yes Performed by: Mancel Bale  Nursing Notes Reviewed/ Care Coordinated Applicable Imaging Reviewed Interpretation of Laboratory Data incorporated into ED treatment   7:04 PM-Consult complete with hospitalist. Patient case explained and discussed.  He agrees to admit patient for further evaluation and treatment. Call ended at 7:15 PM    Final Clinical Impression(s) / ED Diagnoses Final diagnoses:  Fall, initial encounter  AKI (acute kidney injury) (HCC)  Hypokalemia  Hyperglycemia  Seizure disorder [facility])    Rx / DC Orders ED Discharge Orders     None        Mancel Bale, MD 07/13/21 [phone]

## 2021-07-14 ENCOUNTER — Encounter (HOSPITAL_COMMUNITY): Payer: Self-pay | Admitting: Family Medicine

## 2021-07-14 DIAGNOSIS — F4310 Post-traumatic stress disorder, unspecified: Secondary | ICD-10-CM | POA: Diagnosis not present

## 2021-07-14 DIAGNOSIS — N179 Acute kidney failure, unspecified: Secondary | ICD-10-CM | POA: Diagnosis not present

## 2021-07-14 DIAGNOSIS — F319 Bipolar disorder, unspecified: Secondary | ICD-10-CM | POA: Diagnosis not present

## 2021-07-14 DIAGNOSIS — Z90410 Acquired total absence of pancreas: Secondary | ICD-10-CM

## 2021-07-14 LAB — CBC
HCT: 32.7 % — ABNORMAL LOW (ref 39.0–52.0)
Hemoglobin: 11.4 g/dL — ABNORMAL LOW (ref 13.0–17.0)
MCH: 31.3 pg (ref 26.0–34.0)
MCHC: 34.9 g/dL (ref 30.0–36.0)
MCV: 89.8 fL (ref 80.0–100.0)
Platelets: 198 10*3/uL (ref 150–400)
RBC: 3.64 MIL/uL — ABNORMAL LOW (ref 4.22–5.81)
RDW: 12.4 % (ref 11.5–15.5)
WBC: 17.5 10*3/uL — ABNORMAL HIGH (ref 4.0–10.5)
nRBC: 0 % (ref 0.0–0.2)

## 2021-07-14 LAB — HEMOGLOBIN A1C
Hgb A1c MFr Bld: 12.6 % — ABNORMAL HIGH (ref 4.8–5.6)
Mean Plasma Glucose: 314.92 mg/dL

## 2021-07-14 LAB — BASIC METABOLIC PANEL
Anion gap: 7 (ref 5–15)
BUN: 23 mg/dL — ABNORMAL HIGH (ref 6–20)
CO2: 20 mmol/L — ABNORMAL LOW (ref 22–32)
Calcium: 8.2 mg/dL — ABNORMAL LOW (ref 8.9–10.3)
Chloride: 114 mmol/L — ABNORMAL HIGH (ref 98–111)
Creatinine, Ser: 1.27 mg/dL — ABNORMAL HIGH (ref 0.61–1.24)
GFR, Estimated: >60 mL/min (ref >60)
Glucose, Bld: 92 mg/dL (ref 70–99)
Potassium: 3.6 mmol/L (ref 3.5–5.1)
Sodium: 141 mmol/L (ref 135–145)

## 2021-07-14 LAB — HIV ANTIBODY (ROUTINE TESTING W REFLEX): HIV Screen 4th Generation wRfx: NONREACTIVE

## 2021-07-14 LAB — GLUCOSE, CAPILLARY
Glucose-Capillary: 92 mg/dL (ref 70–99)
Glucose-Capillary: 256 mg/dL — ABNORMAL HIGH (ref 70–99)
Glucose-Capillary: 189 mg/dL — ABNORMAL HIGH (ref 70–99)
Glucose-Capillary: 79 mg/dL (ref 70–99)

## 2021-07-14 LAB — SARS CORONAVIRUS 2 (TAT 6-24 HRS): SARS Coronavirus 2: NEGATIVE

## 2021-07-14 MED ORDER — INSULIN GLARGINE-YFGN 100 UNIT/ML ~~LOC~~ SOLN
40.0000 [IU] | Freq: Every day | SUBCUTANEOUS | Status: DC
  Administered 2021-07-14 – 2021-07-15 (×2): 40 [IU] via SUBCUTANEOUS
  Filled 2021-07-14 (×4): qty 0.4

## 2021-07-14 MED ORDER — HYDROXYZINE HCL 25 MG PO TABS
25.0000 mg | ORAL_TABLET | Freq: Four times a day (QID) | ORAL | Status: DC
  Administered 2021-07-14 – 2021-07-18 (×18): 25 mg via ORAL
  Filled 2021-07-14 (×18): qty 1

## 2021-07-14 MED ORDER — CYCLOBENZAPRINE HCL 10 MG PO TABS
5.0000 mg | ORAL_TABLET | Freq: Three times a day (TID) | ORAL | Status: DC | PRN
  Administered 2021-07-16 – 2021-07-17 (×2): 5 mg via ORAL
  Filled 2021-07-14 (×2): qty 1

## 2021-07-14 MED ORDER — INSULIN LISPRO (1 UNIT DIAL) 100 UNIT/ML (KWIKPEN): 16.0000 [IU] | PEN_INJECTOR | Freq: Three times a day (TID) | SUBCUTANEOUS | Status: DC

## 2021-07-14 MED ORDER — LINACLOTIDE 72 MCG PO CAPS
72.0000 ug | ORAL_CAPSULE | Freq: Every day | ORAL | Status: DC
  Administered 2021-07-14 – 2021-07-18 (×5): 72 ug via ORAL
  Filled 2021-07-14 (×6): qty 1

## 2021-07-14 MED ORDER — MOMETASONE FURO-FORMOTEROL FUM 200-5 MCG/ACT IN AERO
2.0000 | INHALATION_SPRAY | Freq: Two times a day (BID) | RESPIRATORY_TRACT | Status: DC
  Administered 2021-07-14 – 2021-07-18 (×8): 2 via RESPIRATORY_TRACT
  Filled 2021-07-14: qty 8.8

## 2021-07-14 MED ORDER — ZOLPIDEM TARTRATE 5 MG PO TABS: 5.0000 mg | ORAL_TABLET | Freq: Every evening | ORAL | Status: DC | PRN

## 2021-07-14 MED ORDER — INSULIN ASPART 100 UNIT/ML IJ SOLN
12.0000 [IU] | Freq: Three times a day (TID) | INTRAMUSCULAR | Status: DC
  Administered 2021-07-14 – 2021-07-18 (×12): 12 [IU] via SUBCUTANEOUS

## 2021-07-14 MED ORDER — SUCRALFATE 1 G PO TABS
1.0000 g | ORAL_TABLET | Freq: Three times a day (TID) | ORAL | Status: DC
  Administered 2021-07-14 – 2021-07-18 (×13): 1 g via ORAL
  Filled 2021-07-14 (×13): qty 1

## 2021-07-14 MED ORDER — ARIPIPRAZOLE 10 MG PO TABS
20.0000 mg | ORAL_TABLET | Freq: Every day | ORAL | Status: DC
  Administered 2021-07-14 – 2021-07-17 (×4): 20 mg via ORAL
  Filled 2021-07-14 (×4): qty 2

## 2021-07-14 MED ORDER — PREGABALIN 50 MG PO CAPS
100.0000 mg | ORAL_CAPSULE | Freq: Two times a day (BID) | ORAL | Status: DC
  Administered 2021-07-14 – 2021-07-18 (×9): 100 mg via ORAL
  Filled 2021-07-14 (×9): qty 2

## 2021-07-14 MED ORDER — ANAGRELIDE HCL 1 MG PO CAPS
1.0000 mg | ORAL_CAPSULE | Freq: Two times a day (BID) | ORAL | Status: DC
  Administered 2021-07-15 – 2021-07-17 (×4): 1 mg via ORAL
  Filled 2021-07-14: qty 1

## 2021-07-14 MED ORDER — CHLORHEXIDINE GLUCONATE CLOTH 2 % EX PADS
6.0000 | MEDICATED_PAD | Freq: Every day | CUTANEOUS | Status: DC
  Administered 2021-07-14 – 2021-07-18 (×4): 6 via TOPICAL

## 2021-07-14 MED ORDER — MIRABEGRON ER 25 MG PO TB24
50.0000 mg | ORAL_TABLET | Freq: Every day | ORAL | Status: DC
  Administered 2021-07-14 – 2021-07-18 (×5): 50 mg via ORAL
  Filled 2021-07-14 (×5): qty 2

## 2021-07-14 MED ORDER — LIVING WELL WITH DIABETES BOOK: Freq: Once | Status: AC
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
## 2021-07-14 NOTE — Progress Notes (Signed)
Inpatient Diabetes Program Recommendations  AACE/ADA: New Consensus Statement on Inpatient Glycemic Control   Target Ranges:  Prepandial:   less than 140 mg/dL      Peak postprandial:   less than 180 mg/dL (1-2 hours)      Critically ill patients:  140 - 180 mg/dL   Results for KYDAN, SHANHOLTZER (MRN [id number]) as of 07/14/2021 13:43  Ref. Range 07/13/2021 21:32 07/13/2021 23:29 07/14/2021 07:41 07/14/2021 11:27  Glucose-Capillary Latest Ref Range: 70 - 99 mg/dL 829 (H) 562 (H) 92 130 (H)   Results for KIT, MOLLETT (MRN [id number]) as of 07/14/2021 13:43  Ref. Range 07/13/2021 16:38  Glucose Latest Ref Range: 70 - 99 mg/dL 295 (H)  Hemoglobin M8U Latest Ref Range: 4.8 - 5.6 % 12.6 (H)   Review of Glycemic Control  Diabetes history: DM Outpatient Diabetes medications: Lantus 50 units QHS, Humalog 16 units TID with meals Current orders for Inpatient glycemic control: Semglee 40 units QHS, Novolog 12 units TID with meals, Novolog 0-9 units TID with meals, Novolog 0-5 units QHS  Inpatient Diabetes Program Recommendations:    HbgA1C: A1C 12.6% on 07/13/21 indicating an average 315 mg/dl over the past 2-3 months.  NOTE: Per chart, patient has hx of DM and had pancreatectomy 05/03/20 as well as an islet cell transplant on 05/03/20. Called patient over phone to discuss DM control and regimen. Patient's spouse Iantha Fallen) answered the phone and initially said the patient was "doing something right now", asked if I could speak with him or should I call back. Was told to hold on and after 4 minutes, Iantha Fallen asked what I needed and communicated between myself and the patient. Through Iantha Fallen, patient stated that he could not think right now.  He reported that he had total pancreatectomy in May 2021 as well as islet cell transplant into liver. Patient reported that he took Lantus 50 units QHS and Humalog 16 units TID with meals. Patient reported his glucose is usually high in 200's-300's and sometimes higher. Patient reported  that he has an Actor in [place] that he last seen last month. Asked if any changes were made at last visit and patient reported no changes were made. Discussed A1C of 12.6% indicating an average glucose of 315 mg/dl. Discussed glucose and A1C goals. Discussed importance of checking CBGs and maintaining good CBG control to prevent long-term and short-term complications. Explained how hyperglycemia leads to damage within blood vessels which lead to the common complications seen with uncontrolled diabetes. Iantha Fallen seemed surprised that hyperglycemia can lead to complications. While discussing complications, Iantha Fallen stated, "I need to go now." Emphasized importance of patient getting DM under better control and he needs to follow up with Endocrinologist. Iantha Fallen verbalized understanding and ended the call.  Thanks, Orlando Penner, RN, MSN, CDE Diabetes Coordinator Inpatient Diabetes Program [phone] (Team Pager)

## 2021-07-14 NOTE — Plan of Care (Signed)
  Problem: Education: Goal: Knowledge of General Education information will improve Description Including pain rating scale, medication(s)/side effects and non-pharmacologic comfort measures Outcome: Progressing   Problem: Health Behavior/Discharge Planning: Goal: Ability to manage health-related needs will improve Outcome: Progressing   

## 2021-07-14 NOTE — Progress Notes (Signed)
Unable to get to [facility] at this time due to short staffing at [facility] - Dr. Laural Benes made aware

## 2021-07-14 NOTE — Progress Notes (Signed)
3 bags of patient's home medications sent to pharmacy.
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
## 2021-07-15 DIAGNOSIS — F259 Schizoaffective disorder, unspecified: Secondary | ICD-10-CM | POA: Diagnosis present

## 2021-07-15 DIAGNOSIS — G40919 Epilepsy, unspecified, intractable, without status epilepticus: Secondary | ICD-10-CM | POA: Diagnosis not present

## 2021-07-15 DIAGNOSIS — Z791 Long term (current) use of non-steroidal anti-inflammatories (NSAID): Secondary | ICD-10-CM | POA: Diagnosis not present

## 2021-07-15 DIAGNOSIS — F319 Bipolar disorder, unspecified: Secondary | ICD-10-CM | POA: Diagnosis present

## 2021-07-15 DIAGNOSIS — E860 Dehydration: Secondary | ICD-10-CM | POA: Diagnosis present

## 2021-07-15 DIAGNOSIS — N3941 Urge incontinence: Secondary | ICD-10-CM | POA: Diagnosis present

## 2021-07-15 DIAGNOSIS — G8929 Other chronic pain: Secondary | ICD-10-CM | POA: Diagnosis present

## 2021-07-15 DIAGNOSIS — F4310 Post-traumatic stress disorder, unspecified: Secondary | ICD-10-CM | POA: Diagnosis present

## 2021-07-15 DIAGNOSIS — I10 Essential (primary) hypertension: Secondary | ICD-10-CM | POA: Diagnosis present

## 2021-07-15 DIAGNOSIS — K219 Gastro-esophageal reflux disease without esophagitis: Secondary | ICD-10-CM | POA: Diagnosis present

## 2021-07-15 DIAGNOSIS — G40909 Epilepsy, unspecified, not intractable, without status epilepticus: Secondary | ICD-10-CM | POA: Diagnosis present

## 2021-07-15 DIAGNOSIS — Z90410 Acquired total absence of pancreas: Secondary | ICD-10-CM | POA: Diagnosis not present

## 2021-07-15 DIAGNOSIS — Z9049 Acquired absence of other specified parts of digestive tract: Secondary | ICD-10-CM | POA: Diagnosis not present

## 2021-07-15 DIAGNOSIS — Z87891 Personal history of nicotine dependence: Secondary | ICD-10-CM | POA: Diagnosis not present

## 2021-07-15 DIAGNOSIS — Z62810 Personal history of physical and sexual abuse in childhood: Secondary | ICD-10-CM | POA: Diagnosis present

## 2021-07-15 DIAGNOSIS — Z8249 Family history of ischemic heart disease and other diseases of the circulatory system: Secondary | ICD-10-CM | POA: Diagnosis not present

## 2021-07-15 DIAGNOSIS — Z8673 Personal history of transient ischemic attack (TIA), and cerebral infarction without residual deficits: Secondary | ICD-10-CM | POA: Diagnosis not present

## 2021-07-15 DIAGNOSIS — M879 Osteonecrosis, unspecified: Secondary | ICD-10-CM | POA: Diagnosis present

## 2021-07-15 DIAGNOSIS — E1165 Type 2 diabetes mellitus with hyperglycemia: Secondary | ICD-10-CM | POA: Diagnosis present

## 2021-07-15 DIAGNOSIS — Z20822 Contact with and (suspected) exposure to covid-19: Secondary | ICD-10-CM | POA: Diagnosis present

## 2021-07-15 DIAGNOSIS — E876 Hypokalemia: Secondary | ICD-10-CM | POA: Diagnosis present

## 2021-07-15 DIAGNOSIS — N390 Urinary tract infection, site not specified: Secondary | ICD-10-CM | POA: Diagnosis present

## 2021-07-15 DIAGNOSIS — Z794 Long term (current) use of insulin: Secondary | ICD-10-CM | POA: Diagnosis not present

## 2021-07-15 DIAGNOSIS — E1142 Type 2 diabetes mellitus with diabetic polyneuropathy: Secondary | ICD-10-CM | POA: Diagnosis present

## 2021-07-15 DIAGNOSIS — N179 Acute kidney failure, unspecified: Secondary | ICD-10-CM | POA: Diagnosis present

## 2021-07-15 LAB — CBC WITH DIFFERENTIAL/PLATELET
Abs Immature Granulocytes: 0.08 10*3/uL — ABNORMAL HIGH (ref 0.00–0.07)
Basophils Absolute: 0.1 10*3/uL (ref 0.0–0.1)
Basophils Relative: 1 %
Eosinophils Absolute: 0.7 10*3/uL — ABNORMAL HIGH (ref 0.0–0.5)
Eosinophils Relative: 5 %
HCT: 34.7 % — ABNORMAL LOW (ref 39.0–52.0)
Hemoglobin: 11.5 g/dL — ABNORMAL LOW (ref 13.0–17.0)
Immature Granulocytes: 1 %
Lymphocytes Relative: 26 %
Lymphs Abs: 3.3 10*3/uL (ref 0.7–4.0)
MCH: 30.7 pg (ref 26.0–34.0)
MCHC: 33.1 g/dL (ref 30.0–36.0)
MCV: 92.8 fL (ref 80.0–100.0)
Monocytes Absolute: 1.6 10*3/uL — ABNORMAL HIGH (ref 0.1–1.0)
Monocytes Relative: 12 %
Neutro Abs: 7 10*3/uL (ref 1.7–7.7)
Neutrophils Relative %: 55 %
Platelets: 206 10*3/uL (ref 150–400)
RBC: 3.74 MIL/uL — ABNORMAL LOW (ref 4.22–5.81)
RDW: 13.1 % (ref 11.5–15.5)
WBC: 12.6 10*3/uL — ABNORMAL HIGH (ref 4.0–10.5)
nRBC: 0 % (ref 0.0–0.2)

## 2021-07-15 LAB — BASIC METABOLIC PANEL
Anion gap: 5 (ref 5–15)
BUN: 18 mg/dL (ref 6–20)
CO2: 21 mmol/L — ABNORMAL LOW (ref 22–32)
Calcium: 8.4 mg/dL — ABNORMAL LOW (ref 8.9–10.3)
Chloride: 119 mmol/L — ABNORMAL HIGH (ref 98–111)
Creatinine, Ser: 0.94 mg/dL (ref 0.61–1.24)
GFR, Estimated: >60 mL/min (ref >60)
Glucose, Bld: 131 mg/dL — ABNORMAL HIGH (ref 70–99)
Potassium: 3.5 mmol/L (ref 3.5–5.1)
Sodium: 145 mmol/L (ref 135–145)

## 2021-07-15 LAB — GLUCOSE, CAPILLARY
Glucose-Capillary: 133 mg/dL — ABNORMAL HIGH (ref 70–99)
Glucose-Capillary: 113 mg/dL — ABNORMAL HIGH (ref 70–99)
Glucose-Capillary: 74 mg/dL (ref 70–99)
Glucose-Capillary: 186 mg/dL — ABNORMAL HIGH (ref 70–99)

## 2021-07-15 LAB — MAGNESIUM: Magnesium: 2.1 mg/dL (ref 1.7–2.4)

## 2021-07-15 MED ORDER — LEVETIRACETAM 500 MG PO TABS
750.0000 mg | ORAL_TABLET | Freq: Two times a day (BID) | ORAL | Status: DC
  Administered 2021-07-15 – 2021-07-18 (×7): 750 mg via ORAL
  Filled 2021-07-15 (×7): qty 1
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
## 2021-07-15 NOTE — Plan of Care (Signed)
  Problem: Education: Goal: Knowledge of General Education information will improve Description Including pain rating scale, medication(s)/side effects and non-pharmacologic comfort measures Outcome: Progressing   Problem: Health Behavior/Discharge Planning: Goal: Ability to manage health-related needs will improve Outcome: Progressing   

## 2021-07-16 DIAGNOSIS — F319 Bipolar disorder, unspecified: Secondary | ICD-10-CM | POA: Diagnosis not present

## 2021-07-16 DIAGNOSIS — Z90410 Acquired total absence of pancreas: Secondary | ICD-10-CM | POA: Diagnosis not present

## 2021-07-16 DIAGNOSIS — N179 Acute kidney failure, unspecified: Secondary | ICD-10-CM | POA: Diagnosis not present

## 2021-07-16 DIAGNOSIS — F4310 Post-traumatic stress disorder, unspecified: Secondary | ICD-10-CM | POA: Diagnosis not present

## 2021-07-16 LAB — GLUCOSE, CAPILLARY
Glucose-Capillary: 126 mg/dL — ABNORMAL HIGH (ref 70–99)
Glucose-Capillary: 210 mg/dL — ABNORMAL HIGH (ref 70–99)
Glucose-Capillary: 234 mg/dL — ABNORMAL HIGH (ref 70–99)
Glucose-Capillary: 273 mg/dL — ABNORMAL HIGH (ref 70–99)
Glucose-Capillary: 60 mg/dL — ABNORMAL LOW (ref 70–99)
Glucose-Capillary: 86 mg/dL (ref 70–99)

## 2021-07-16 LAB — VITAMIN B12: Vitamin B-12: 428 pg/mL (ref 180–914)

## 2021-07-16 NOTE — Plan of Care (Signed)
  Problem: Education: Goal: Knowledge of General Education information will improve Description: Including pain rating scale, medication(s)/side effects and non-pharmacologic comfort measures 07/16/2021 0206 by Karolee Ohs, RN Outcome: Progressing 07/15/2021 2023 by Karolee Ohs, RN Outcome: Progressing   Problem: Health Behavior/Discharge Planning: Goal: Ability to manage health-related needs will improve 07/16/2021 0206 by Karolee Ohs, RN Outcome: Progressing 07/15/2021 2023 by Karolee Ohs, RN Outcome: Progressing
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
## 2021-07-16 NOTE — Progress Notes (Signed)
Patient called this nurse into room due to feeling like he was "about to have a seizure." This nurse entered room to assess patient. Complaints of head tightness and fullness, mild light sensitivity, and what he described as beginning of a migraine. VS obtained and stable, PRN Ativan given. This nurse remained in patient's room for approximately 4-5 minutes until patient stated he felt that the sensation was going away. Patient told to notify nurse if he began to feel it starting again, verbalized understanding. Will continue to monitor.

## 2021-07-17 DIAGNOSIS — N179 Acute kidney failure, unspecified: Secondary | ICD-10-CM | POA: Diagnosis not present

## 2021-07-17 DIAGNOSIS — Z90410 Acquired total absence of pancreas: Secondary | ICD-10-CM | POA: Diagnosis not present

## 2021-07-17 DIAGNOSIS — R569 Unspecified convulsions: Secondary | ICD-10-CM

## 2021-07-17 DIAGNOSIS — F4310 Post-traumatic stress disorder, unspecified: Secondary | ICD-10-CM | POA: Diagnosis not present

## 2021-07-17 DIAGNOSIS — F319 Bipolar disorder, unspecified: Secondary | ICD-10-CM | POA: Diagnosis not present

## 2021-07-17 LAB — URINE CULTURE: Culture: >=100000 — AB

## 2021-07-17 LAB — GLUCOSE, CAPILLARY
Glucose-Capillary: 202 mg/dL — ABNORMAL HIGH (ref 70–99)
Glucose-Capillary: 263 mg/dL — ABNORMAL HIGH (ref 70–99)
Glucose-Capillary: 298 mg/dL — ABNORMAL HIGH (ref 70–99)
Glucose-Capillary: 152 mg/dL — ABNORMAL HIGH (ref 70–99)

## 2021-07-17 MED ORDER — KETOROLAC TROMETHAMINE 30 MG/ML IJ SOLN
30.0000 mg | Freq: Three times a day (TID) | INTRAMUSCULAR | Status: DC | PRN
  Administered 2021-07-17 – 2021-07-18 (×3): 30 mg via INTRAVENOUS
  Filled 2021-07-17 (×3): qty 1

## 2021-07-17 MED ORDER — INSULIN GLARGINE-YFGN 100 UNIT/ML ~~LOC~~ SOLN
45.0000 [IU] | Freq: Every day | SUBCUTANEOUS | Status: DC
  Administered 2021-07-17: 45 [IU] via SUBCUTANEOUS
  Filled 2021-07-17 (×2): qty 0.45

## 2021-07-17 MED ORDER — KETOROLAC TROMETHAMINE 30 MG/ML IJ SOLN
30.0000 mg | Freq: Once | INTRAMUSCULAR | Status: AC
  Administered 2021-07-17: 30 mg via INTRAVENOUS
  Filled 2021-07-17: qty 1

## 2021-07-17 NOTE — Progress Notes (Signed)
Inpatient Diabetes Program Recommendations  AACE/ADA: New Consensus Statement on Inpatient Glycemic Control (2015)  Target Ranges:  Prepandial:   less than 140 mg/dL      Peak postprandial:   less than 180 mg/dL (1-2 hours)      Critically ill patients:  140 - 180 mg/dL   Lab Results  Component Value Date   GLUCAP 202 (H) 07/17/2021   HGBA1C 12.6 (H) 07/13/2021    Review of Glycemic Control Results for Dennis Yang, Dennis Yang (MRN [id number]) as of 07/17/2021 10:57  Ref. Range 07/16/2021 07:42 07/16/2021 10:59 07/16/2021 15:59 07/16/2021 22:10 07/16/2021 22:39 07/16/2021 23:35 07/17/2021 07:26  Glucose-Capillary Latest Ref Range: 70 - 99 mg/dL 195 (H) 093 (H) 267 (H) 60 (L) 86 273 (H) 202 (H)   Diabetes history: DM 2 Outpatient Diabetes medications: Humalog 16 units tid, lantus 50 units qhs Current orders for Inpatient glycemic control:  Semglee 40 units qhs Novolog 0-9 units tid + hs Novolog 12 units tid meal coverage  Inpatient Diabetes Program Recommendations:    Fasting glucose >200 Hypoglycemia possibly due to meal coverage dose  -   Increase Semglee to 45 units -   Decrease Novolog meal coverage to 10 units  Thanks, Christena Deem RN, MSN, BC-ADM Inpatient Diabetes Coordinator Team Pager [phone] (8a-5p)

## 2021-07-17 NOTE — Progress Notes (Signed)
TR[facility] night shift MedSurg coverage note.  The nursing staff just reported that the patient is still having headache.  He had acetaminophen earlier but stated this did not work very well.  Ketorolac 30 mg IVP x1 dose ordered.  Sanda Klein, MD.
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
## 2021-07-18 ENCOUNTER — Inpatient Hospital Stay (HOSPITAL_COMMUNITY)
Admit: 2021-07-18 | Discharge: 2021-07-18 | Disposition: A | Discharge status: Home / Self Care | Payer: Medicare Other | Attending: Family Medicine | Admitting: Family Medicine

## 2021-07-18 DIAGNOSIS — I10 Essential (primary) hypertension: Secondary | ICD-10-CM

## 2021-07-18 DIAGNOSIS — F319 Bipolar disorder, unspecified: Secondary | ICD-10-CM | POA: Diagnosis not present

## 2021-07-18 DIAGNOSIS — G40919 Epilepsy, unspecified, intractable, without status epilepticus: Secondary | ICD-10-CM | POA: Diagnosis not present

## 2021-07-18 DIAGNOSIS — N179 Acute kidney failure, unspecified: Secondary | ICD-10-CM | POA: Diagnosis not present

## 2021-07-18 LAB — CBC WITH DIFFERENTIAL/PLATELET
Abs Immature Granulocytes: 0.12 10*3/uL — ABNORMAL HIGH (ref 0.00–0.07)
Basophils Absolute: 0.2 10*3/uL — ABNORMAL HIGH (ref 0.0–0.1)
Basophils Relative: 1 %
Eosinophils Absolute: 0.7 10*3/uL — ABNORMAL HIGH (ref 0.0–0.5)
Eosinophils Relative: 4 %
HCT: 28.9 % — ABNORMAL LOW (ref 39.0–52.0)
Hemoglobin: 9.8 g/dL — ABNORMAL LOW (ref 13.0–17.0)
Immature Granulocytes: 1 %
Lymphocytes Relative: 38 %
Lymphs Abs: 5.7 10*3/uL — ABNORMAL HIGH (ref 0.7–4.0)
MCH: 31.5 pg (ref 26.0–34.0)
MCHC: 33.9 g/dL (ref 30.0–36.0)
MCV: 92.9 fL (ref 80.0–100.0)
Monocytes Absolute: 1.8 10*3/uL — ABNORMAL HIGH (ref 0.1–1.0)
Monocytes Relative: 12 %
Neutro Abs: 6.7 10*3/uL (ref 1.7–7.7)
Neutrophils Relative %: 44 %
Platelets: 457 10*3/uL — ABNORMAL HIGH (ref 150–400)
RBC: 3.11 MIL/uL — ABNORMAL LOW (ref 4.22–5.81)
RDW: 13.3 % (ref 11.5–15.5)
WBC: 15.1 10*3/uL — ABNORMAL HIGH (ref 4.0–10.5)
nRBC: 0 % (ref 0.0–0.2)

## 2021-07-18 LAB — CULTURE, BLOOD (ROUTINE X 2)
Culture: NO GROWTH
Culture: NO GROWTH
Special Requests: ADEQUATE

## 2021-07-18 LAB — BASIC METABOLIC PANEL
Anion gap: 4 — ABNORMAL LOW (ref 5–15)
BUN: 17 mg/dL (ref 6–20)
CO2: 23 mmol/L (ref 22–32)
Calcium: 8.6 mg/dL — ABNORMAL LOW (ref 8.9–10.3)
Chloride: 117 mmol/L — ABNORMAL HIGH (ref 98–111)
Creatinine, Ser: 0.79 mg/dL (ref 0.61–1.24)
GFR, Estimated: >60 mL/min (ref >60)
Glucose, Bld: 84 mg/dL (ref 70–99)
Potassium: 3.2 mmol/L — ABNORMAL LOW (ref 3.5–5.1)
Sodium: 144 mmol/L (ref 135–145)

## 2021-07-18 LAB — GLUCOSE, CAPILLARY
Glucose-Capillary: 92 mg/dL (ref 70–99)
Glucose-Capillary: 139 mg/dL — ABNORMAL HIGH (ref 70–99)
Glucose-Capillary: 76 mg/dL (ref 70–99)

## 2021-07-18 LAB — MAGNESIUM: Magnesium: 2 mg/dL (ref 1.7–2.4)

## 2021-07-18 MED ORDER — AMOXICILLIN 875 MG PO TABS: 875.0000 mg | ORAL_TABLET | Freq: Two times a day (BID) | ORAL | 0 refills | Status: AC

## 2021-07-18 MED ORDER — ONDANSETRON 8 MG PO TBDP: 8.0000 mg | ORAL_TABLET | Freq: Three times a day (TID) | ORAL | Status: DC

## 2021-07-18 MED ORDER — POTASSIUM CHLORIDE CRYS ER 20 MEQ PO TBCR
60.0000 meq | EXTENDED_RELEASE_TABLET | Freq: Once | ORAL | Status: AC
  Administered 2021-07-18: 60 meq via ORAL
  Filled 2021-07-18: qty 3

## 2021-07-18 MED ORDER — HEPARIN SOD (PORK) LOCK FLUSH 100 UNIT/ML IV SOLN
500.0000 [IU] | Freq: Once | INTRAVENOUS | Status: AC
  Administered 2021-07-18: 500 [IU] via INTRAVENOUS
  Filled 2021-07-18: qty 5

## 2021-07-18 NOTE — Procedures (Signed)
Patient Name: Dennis Yang  MRN: [id number]  Epilepsy Attending: Charlsie Quest  Referring Physician/Provider: Dr Standley Dakins Date: 07/18/2021 Duration: 28.38  mins  Patient history: 35yo m with h/o epilepsy presented with breakthrough seizure. EEG to evaluate for seizure  Level of alertness: Awake, asleep  AEDs during EEG study: LEV, TPM, PGB  Technical aspects: This EEG study was done with scalp electrodes positioned according to the 10-20 International system of electrode placement. Electrical activity was acquired at a sampling rate of 500Hz  and reviewed with a high frequency filter of 70Hz  and a low frequency filter of 1Hz . EEG data were recorded continuously and digitally stored.   Description: The posterior dominant rhythm consists of 8 Hz activity of moderate voltage (25-35 uV) seen predominantly in posterior head regions, symmetric and reactive to eye opening and eye closing. Sleep was characterized by vertex waves, sleep spindles (12 to 14 Hz), maximal frontocentral region.Physiologic photic driving was not seen during photic stimulation.  Hyperventilation was not performed.     IMPRESSION: This study is within normal limits. No seizures or epileptiform discharges were seen throughout the recording.  Dennis Yang 

## 2021-07-18 NOTE — Discharge Summary (Signed)
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  7:11 PM   Specimen: Nasopharyngeal Swab  Result Value Ref Range Status   SARS Coronavirus 2 NEGATIVE NEGATIVE Final    Comment: (NOTE) SARS-CoV-2 target nucleic acids are NOT DETECTED.  The SARS-CoV-2 RNA is generally detectable in upper and lower respiratory specimens during the acute phase of infection. Negative results do not preclude SARS-CoV-2 infection, do not rule out co-infections with other pathogens, and should not be used as the sole basis for treatment or other patient management decisions. Negative results must be combined with clinical observations, patient history, and epidemiological information. The  expected result is Negative.  Fact Sheet for Patients: HairSlick.no  Fact Sheet for Healthcare Providers: quierodirigir.com  This test is not yet approved or cleared by the [place] FDA and  has been authorized for detection and/or diagnosis of SARS-CoV-2 by FDA under an Emergency Use Authorization (EUA). This EUA will remain  in effect (meaning this test can be used) for the duration of the COVID-19 declaration under Se ction 564(b)(1) of the Act, 21 U.S.C. section 360bbb-3(b)(1), unless the authorization is terminated or revoked sooner.  Performed at [facility], [address] [place], [place] [address]   Urine Culture     Status: Abnormal   Collection Time: 07/13/21  8:47 PM   Specimen: Urine, Clean Catch  Result Value Ref Range Status   Specimen Description   Final    URINE, CLEAN CATCH Performed at [facility], [address], [place], [place] [address]    Special Requests   Final    NONE Performed at [facility], [address] [place], [place] [address]    Culture >=100,000 COLONIES/mL ENTEROCOCCUS FAECALIS (A)  Final   Report Status 07/17/2021 FINAL  Final   Organism ID, Bacteria ENTEROCOCCUS FAECALIS (A)  Final      Susceptibility   Enterococcus faecalis - MIC*    AMPICILLIN <=2 SENSITIVE Sensitive     NITROFURANTOIN <=16 SENSITIVE Sensitive     VANCOMYCIN 1 SENSITIVE Sensitive     * >=100,000 COLONIES/mL ENTEROCOCCUS FAECALIS  Culture, blood (Routine X 2) w Reflex to ID Panel     Status: None   Collection Time: 07/13/21  9:14 PM   Specimen: BLOOD  Result Value Ref Range Status   Specimen  Description BLOOD LEFT ANTECUBITAL  Final   Special Requests   Final    Blood Culture results may not be optimal due to an excessive volume of blood received in culture bottles BOTTLES DRAWN AEROBIC AND ANAEROBIC   Culture   Final    NO GROWTH 5 DAYS Performed at [facility], [address],  [place], [place] [address]    Report Status 07/18/2021 FINAL  Final  Culture, blood (Routine X 2) w Reflex to ID Panel     Status: None   Collection Time: 07/13/21  9:39 PM   Specimen: BLOOD  Result Value Ref Range Status   Specimen Description BLOOD BLOOD LEFT FOREARM  Final   Special Requests   Final    Blood Culture adequate volume BOTTLES DRAWN AEROBIC AND ANAEROBIC   Culture   Final    NO GROWTH 5 DAYS Performed at [facility], [address] [place], [place] [address]    Report Status 07/18/2021 FINAL  Final   Time coordinating discharge: 36 mins   SIGNED:  Standley Dakins, MD  [facility] 07/18/2021, 3:52 PM How to contact the [facility] Attending or Consulting provider 7A - 7P or covering provider during after hours 7P -7A, for this patient?  Check the care team in [facility] and look for a) attending/consulting TR[facility] provider listed and b) the [facility] team listed Log into [email or web address] and use [place]'s universal password to access. If you do not have the password, please contact the hospital operator. Locate the [facility] provider you are looking for under [facility] and page to a number that you can be directly reached. If you still have difficulty reaching the provider, please page the [facility] (Director on Call) for the Hospitalists listed on amion for assistance.

## 2021-07-18 NOTE — Plan of Care (Signed)

## 2021-07-18 NOTE — Discharge Instructions (Addendum)
Please call and follow up with Dr. Gerilyn Pilgrim to arrange for 24 hour Video EEG study     IMPORTANT INFORMATION: PAY CLOSE ATTENTION   PHYSICIAN DISCHARGE INSTRUCTIONS  Follow with Primary care provider  Tacy Learn, FNP  and other consultants as instructed by your Hospitalist Physician  SEEK MEDICAL CARE OR RETURN TO EMERGENCY ROOM IF SYMPTOMS COME BACK, WORSEN OR NEW PROBLEM DEVELOPS   Please note: You were cared for by a hospitalist during your hospital stay. Every effort will be made to forward records to your primary care provider.  You can request that your primary care provider send for your hospital records if they have not received them.  Once you are discharged, your primary care physician will handle any further medical issues. Please note that NO REFILLS for any discharge medications will be authorized once you are discharged, as it is imperative that you return to your primary care physician (or establish a relationship with a primary care physician if you do not have one) for your post hospital discharge needs so that they can reassess your need for medications and monitor your lab values.  Please get a complete blood count and chemistry panel checked by your Primary MD at your next visit, and again as instructed by your Primary MD.  Get Medicines reviewed and adjusted: Please take all your medications with you for your next visit with your Primary MD  Laboratory/radiological data: Please request your Primary MD to go over all hospital tests and procedure/radiological results at the follow up, please ask your primary care provider to get all Hospital records sent to his/her office.  In some cases, they will be blood work, cultures and biopsy results pending at the time of your discharge. Please request that your primary care provider follow up on these results.  If you are diabetic, please bring your blood sugar readings with you to your follow up appointment with primary  care.    Please call and make your follow up appointments as soon as possible.    Also Note the following: If you experience worsening of your admission symptoms, develop shortness of breath, life threatening emergency, suicidal or homicidal thoughts you must seek medical attention immediately by calling 911 or calling your MD immediately  if symptoms less severe.  You must read complete instructions/literature along with all the possible adverse reactions/side effects for all the Medicines you take and that have been prescribed to you. Take any new Medicines after you have completely understood and accpet all the possible adverse reactions/side effects.   Do not drive when taking Pain medications or sleeping medications (Benzodiazepines)  Do not take more than prescribed Pain, Sleep and Anxiety Medications. It is not advisable to combine anxiety,sleep and pain medications without talking with your primary care practitioner  Special Instructions: If you have smoked or chewed Tobacco  in the last 2 yrs please stop smoking, stop any regular Alcohol  and or any Recreational drug use.  Wear Seat belts while driving.  Do not drive if taking any narcotic, mind altering or controlled substances or recreational drugs or alcohol.

## 2021-07-18 NOTE — Progress Notes (Signed)
EEG Completed; Results Pending  

## 2021-07-26 ENCOUNTER — Encounter (HOSPITAL_COMMUNITY): Payer: Self-pay

## 2021-07-26 ENCOUNTER — Other Ambulatory Visit: Payer: Self-pay

## 2021-07-26 ENCOUNTER — Emergency Department (HOSPITAL_COMMUNITY)
Admission: EM | Admit: 2021-07-26 | Discharge: 2021-07-27 | Disposition: A | Discharge status: Home / Self Care | Payer: Medicare Other | Attending: Emergency Medicine | Admitting: Emergency Medicine

## 2021-07-26 DIAGNOSIS — R300 Dysuria: Secondary | ICD-10-CM | POA: Insufficient documentation

## 2021-07-26 DIAGNOSIS — Z794 Long term (current) use of insulin: Secondary | ICD-10-CM | POA: Insufficient documentation

## 2021-07-26 DIAGNOSIS — Z79899 Other long term (current) drug therapy: Secondary | ICD-10-CM | POA: Diagnosis not present

## 2021-07-26 DIAGNOSIS — Z87891 Personal history of nicotine dependence: Secondary | ICD-10-CM | POA: Insufficient documentation

## 2021-07-26 DIAGNOSIS — Z8673 Personal history of transient ischemic attack (TIA), and cerebral infarction without residual deficits: Secondary | ICD-10-CM | POA: Diagnosis not present

## 2021-07-26 DIAGNOSIS — E119 Type 2 diabetes mellitus without complications: Secondary | ICD-10-CM | POA: Insufficient documentation

## 2021-07-26 DIAGNOSIS — I10 Essential (primary) hypertension: Secondary | ICD-10-CM | POA: Insufficient documentation

## 2021-07-26 DIAGNOSIS — R569 Unspecified convulsions: Secondary | ICD-10-CM | POA: Diagnosis not present

## 2021-07-26 LAB — CBC
HCT: 32 % — ABNORMAL LOW (ref 39.0–52.0)
Hemoglobin: 10.9 g/dL — ABNORMAL LOW (ref 13.0–17.0)
MCH: 31.7 pg (ref 26.0–34.0)
MCHC: 34.1 g/dL (ref 30.0–36.0)
MCV: 93 fL (ref 80.0–100.0)
Platelets: 412 10*3/uL — ABNORMAL HIGH (ref 150–400)
RBC: 3.44 MIL/uL — ABNORMAL LOW (ref 4.22–5.81)
RDW: 13.4 % (ref 11.5–15.5)
WBC: 13.1 10*3/uL — ABNORMAL HIGH (ref 4.0–10.5)
nRBC: 0 % (ref 0.0–0.2)

## 2021-07-26 LAB — BASIC METABOLIC PANEL
Anion gap: 5 (ref 5–15)
BUN: 12 mg/dL (ref 6–20)
CO2: 23 mmol/L (ref 22–32)
Calcium: 8.3 mg/dL — ABNORMAL LOW (ref 8.9–10.3)
Chloride: 110 mmol/L (ref 98–111)
Creatinine, Ser: 0.73 mg/dL (ref 0.61–1.24)
GFR, Estimated: >60 mL/min (ref >60)
Glucose, Bld: 320 mg/dL — ABNORMAL HIGH (ref 70–99)
Potassium: 3.1 mmol/L — ABNORMAL LOW (ref 3.5–5.1)
Sodium: 138 mmol/L (ref 135–145)

## 2021-07-26 LAB — URINALYSIS, ROUTINE W REFLEX MICROSCOPIC
Bacteria, UA: NONE SEEN
Bilirubin Urine: NEGATIVE
Glucose, UA: >=500 mg/dL — AB
Hgb urine dipstick: NEGATIVE
Ketones, ur: NEGATIVE mg/dL
Leukocytes,Ua: NEGATIVE
Nitrite: NEGATIVE
Protein, ur: NEGATIVE mg/dL
Specific Gravity, Urine: 1.01 (ref 1.005–1.030)
pH: 6 (ref 5.0–8.0)

## 2021-07-26 MED ORDER — OXYCODONE-ACETAMINOPHEN 5-325 MG PO TABS
2.0000 | ORAL_TABLET | Freq: Once | ORAL | Status: AC
Start: 2021-07-26 — End: 2021-07-26
  Administered 2021-07-26: 2 via ORAL
  Filled 2021-07-26: qty 2

## 2021-07-26 MED ORDER — POTASSIUM CHLORIDE CRYS ER 20 MEQ PO TBCR
40.0000 meq | EXTENDED_RELEASE_TABLET | Freq: Once | ORAL | Status: AC
  Administered 2021-07-26: 40 meq via ORAL
  Filled 2021-07-26: qty 2

## 2021-07-26 MED ORDER — SODIUM CHLORIDE 0.9 % IV BOLUS
1000.0000 mL | Freq: Once | INTRAVENOUS | Status: AC
  Administered 2021-07-26: 1000 mL via INTRAVENOUS

## 2021-07-26 NOTE — Discharge Instructions (Addendum)
Follow up with your Physicain for recheck  

## 2021-07-26 NOTE — ED Notes (Signed)
Pt here with reports of hx of seizures and have a few seizures pta. Pt is currently alert and oriented x 4. Pt reports mid/right abdominal pain 8/10-tends to be chronic, worse today. Pt concerned for uti as he is prone to them and it "hurts when I pee".
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
## 2021-07-26 NOTE — ED Notes (Signed)
Awaiting provider assessment.'

## 2021-07-27 NOTE — ED Notes (Signed)
Went over d/c with patient and family. Port de-accessed. Denied needing wheelchair or assistance getting dressed.

## 2021-07-31 LAB — LEVETIRACETAM LEVEL: Levetiracetam Lvl: <1 ug/mL — ABNORMAL LOW (ref 10.0–40.0)

## 2022-02-19 ENCOUNTER — Inpatient Hospital Stay (HOSPITAL_COMMUNITY)
Admission: EM | Admit: 2022-02-19 | Discharge: 2022-02-24 | DRG: 638 | Disposition: A | Discharge status: Home / Self Care | Payer: Medicare Other | Attending: Internal Medicine | Admitting: Internal Medicine

## 2022-02-19 ENCOUNTER — Other Ambulatory Visit: Payer: Self-pay

## 2022-02-19 ENCOUNTER — Emergency Department (HOSPITAL_COMMUNITY): Payer: Medicare Other

## 2022-02-19 ENCOUNTER — Encounter (HOSPITAL_COMMUNITY): Payer: Self-pay | Admitting: Internal Medicine

## 2022-02-19 ENCOUNTER — Inpatient Hospital Stay (HOSPITAL_COMMUNITY): Payer: Medicare Other

## 2022-02-19 DIAGNOSIS — L89620 Pressure ulcer of left heel, unstageable: Secondary | ICD-10-CM | POA: Diagnosis present

## 2022-02-19 DIAGNOSIS — Z20822 Contact with and (suspected) exposure to covid-19: Secondary | ICD-10-CM | POA: Diagnosis present

## 2022-02-19 DIAGNOSIS — L02419 Cutaneous abscess of limb, unspecified: Secondary | ICD-10-CM | POA: Diagnosis not present

## 2022-02-19 DIAGNOSIS — K219 Gastro-esophageal reflux disease without esophagitis: Secondary | ICD-10-CM | POA: Diagnosis present

## 2022-02-19 DIAGNOSIS — Z95828 Presence of other vascular implants and grafts: Secondary | ICD-10-CM

## 2022-02-19 DIAGNOSIS — L03119 Cellulitis of unspecified part of limb: Secondary | ICD-10-CM

## 2022-02-19 DIAGNOSIS — M549 Dorsalgia, unspecified: Secondary | ICD-10-CM | POA: Diagnosis present

## 2022-02-19 DIAGNOSIS — Z881 Allergy status to other antibiotic agents status: Secondary | ICD-10-CM | POA: Diagnosis not present

## 2022-02-19 DIAGNOSIS — L03116 Cellulitis of left lower limb: Secondary | ICD-10-CM | POA: Diagnosis present

## 2022-02-19 DIAGNOSIS — Z87891 Personal history of nicotine dependence: Secondary | ICD-10-CM | POA: Diagnosis not present

## 2022-02-19 DIAGNOSIS — Z79899 Other long term (current) drug therapy: Secondary | ICD-10-CM | POA: Diagnosis not present

## 2022-02-19 DIAGNOSIS — E1165 Type 2 diabetes mellitus with hyperglycemia: Secondary | ICD-10-CM | POA: Diagnosis present

## 2022-02-19 DIAGNOSIS — Z794 Long term (current) use of insulin: Secondary | ICD-10-CM | POA: Diagnosis not present

## 2022-02-19 DIAGNOSIS — G40909 Epilepsy, unspecified, not intractable, without status epilepticus: Secondary | ICD-10-CM | POA: Diagnosis present

## 2022-02-19 DIAGNOSIS — F259 Schizoaffective disorder, unspecified: Secondary | ICD-10-CM | POA: Diagnosis present

## 2022-02-19 DIAGNOSIS — E1142 Type 2 diabetes mellitus with diabetic polyneuropathy: Secondary | ICD-10-CM | POA: Diagnosis present

## 2022-02-19 DIAGNOSIS — F4310 Post-traumatic stress disorder, unspecified: Secondary | ICD-10-CM | POA: Diagnosis present

## 2022-02-19 DIAGNOSIS — Z791 Long term (current) use of non-steroidal anti-inflammatories (NSAID): Secondary | ICD-10-CM | POA: Diagnosis not present

## 2022-02-19 DIAGNOSIS — E11621 Type 2 diabetes mellitus with foot ulcer: Secondary | ICD-10-CM | POA: Diagnosis present

## 2022-02-19 DIAGNOSIS — G8929 Other chronic pain: Secondary | ICD-10-CM | POA: Diagnosis present

## 2022-02-19 DIAGNOSIS — I739 Peripheral vascular disease, unspecified: Secondary | ICD-10-CM

## 2022-02-19 DIAGNOSIS — L97429 Non-pressure chronic ulcer of left heel and midfoot with unspecified severity: Secondary | ICD-10-CM | POA: Diagnosis present

## 2022-02-19 DIAGNOSIS — L02612 Cutaneous abscess of left foot: Secondary | ICD-10-CM | POA: Diagnosis present

## 2022-02-19 DIAGNOSIS — A419 Sepsis, unspecified organism: Secondary | ICD-10-CM

## 2022-02-19 DIAGNOSIS — Z7951 Long term (current) use of inhaled steroids: Secondary | ICD-10-CM

## 2022-02-19 DIAGNOSIS — E119 Type 2 diabetes mellitus without complications: Secondary | ICD-10-CM

## 2022-02-19 DIAGNOSIS — F319 Bipolar disorder, unspecified: Secondary | ICD-10-CM | POA: Diagnosis present

## 2022-02-19 DIAGNOSIS — Z8673 Personal history of transient ischemic attack (TIA), and cerebral infarction without residual deficits: Secondary | ICD-10-CM

## 2022-02-19 DIAGNOSIS — R569 Unspecified convulsions: Secondary | ICD-10-CM

## 2022-02-19 DIAGNOSIS — N3281 Overactive bladder: Secondary | ICD-10-CM | POA: Diagnosis present

## 2022-02-19 DIAGNOSIS — L03115 Cellulitis of right lower limb: Secondary | ICD-10-CM | POA: Diagnosis present

## 2022-02-19 DIAGNOSIS — B961 Klebsiella pneumoniae [K. pneumoniae] as the cause of diseases classified elsewhere: Secondary | ICD-10-CM | POA: Diagnosis present

## 2022-02-19 LAB — CBC WITH DIFFERENTIAL/PLATELET
Abs Immature Granulocytes: 0.04 10*3/uL (ref 0.00–0.07)
Basophils Absolute: 0.1 10*3/uL (ref 0.0–0.1)
Basophils Relative: 1 %
Eosinophils Absolute: 0.5 10*3/uL (ref 0.0–0.5)
Eosinophils Relative: 4 %
HCT: 37.5 % — ABNORMAL LOW (ref 39.0–52.0)
Hemoglobin: 12.3 g/dL — ABNORMAL LOW (ref 13.0–17.0)
Immature Granulocytes: 0 %
Lymphocytes Relative: 31 %
Lymphs Abs: 3.5 10*3/uL (ref 0.7–4.0)
MCH: 31.1 pg (ref 26.0–34.0)
MCHC: 32.8 g/dL (ref 30.0–36.0)
MCV: 94.9 fL (ref 80.0–100.0)
Monocytes Absolute: 0.9 10*3/uL (ref 0.1–1.0)
Monocytes Relative: 8 %
Neutro Abs: 6.2 10*3/uL (ref 1.7–7.7)
Neutrophils Relative %: 56 %
Platelets: 259 10*3/uL (ref 150–400)
RBC: 3.95 MIL/uL — ABNORMAL LOW (ref 4.22–5.81)
RDW: 14.4 % (ref 11.5–15.5)
WBC: 11.3 10*3/uL — ABNORMAL HIGH (ref 4.0–10.5)
nRBC: 0 % (ref 0.0–0.2)

## 2022-02-19 LAB — COMPREHENSIVE METABOLIC PANEL
ALT: 34 U/L (ref 0–44)
AST: 54 U/L — ABNORMAL HIGH (ref 15–41)
Albumin: 3.7 g/dL (ref 3.5–5.0)
Alkaline Phosphatase: 131 U/L — ABNORMAL HIGH (ref 38–126)
Anion gap: 8 (ref 5–15)
BUN: 22 mg/dL — ABNORMAL HIGH (ref 6–20)
CO2: 18 mmol/L — ABNORMAL LOW (ref 22–32)
Calcium: 8.9 mg/dL (ref 8.9–10.3)
Chloride: 117 mmol/L — ABNORMAL HIGH (ref 98–111)
Creatinine, Ser: 1.2 mg/dL (ref 0.61–1.24)
GFR, Estimated: >60 mL/min (ref >60)
Glucose, Bld: 297 mg/dL — ABNORMAL HIGH (ref 70–99)
Potassium: 3.6 mmol/L (ref 3.5–5.1)
Sodium: 143 mmol/L (ref 135–145)
Total Bilirubin: 0.4 mg/dL (ref 0.3–1.2)
Total Protein: 6.7 g/dL (ref 6.5–8.1)

## 2022-02-19 LAB — I-STAT CHEM 8, ED
BUN: 21 mg/dL — ABNORMAL HIGH (ref 6–20)
Calcium, Ion: 1.21 mmol/L (ref 1.15–1.40)
Chloride: 114 mmol/L — ABNORMAL HIGH (ref 98–111)
Creatinine, Ser: 1.2 mg/dL (ref 0.61–1.24)
Glucose, Bld: 287 mg/dL — ABNORMAL HIGH (ref 70–99)
HCT: 37 % — ABNORMAL LOW (ref 39.0–52.0)
Hemoglobin: 12.6 g/dL — ABNORMAL LOW (ref 13.0–17.0)
Potassium: 3.5 mmol/L (ref 3.5–5.1)
Sodium: 143 mmol/L (ref 135–145)
TCO2: 19 mmol/L — ABNORMAL LOW (ref 22–32)

## 2022-02-19 LAB — LACTIC ACID, PLASMA: Lactic Acid, Venous: 1.7 mmol/L (ref 0.5–1.9)

## 2022-02-19 LAB — PROTIME-INR
INR: 1 (ref 0.8–1.2)
Prothrombin Time: 13.6 seconds (ref 11.4–15.2)

## 2022-02-19 LAB — URINALYSIS, ROUTINE W REFLEX MICROSCOPIC
Bilirubin Urine: NEGATIVE
Glucose, UA: 150 mg/dL — AB
Hgb urine dipstick: NEGATIVE
Ketones, ur: NEGATIVE mg/dL
Leukocytes,Ua: NEGATIVE
Nitrite: NEGATIVE
Protein, ur: NEGATIVE mg/dL
Specific Gravity, Urine: 1.026 (ref 1.005–1.030)
pH: 5 (ref 5.0–8.0)

## 2022-02-19 LAB — RESP PANEL BY RT-PCR (FLU A&B, COVID) ARPGX2
Influenza A by PCR: NEGATIVE
Influenza B by PCR: NEGATIVE
SARS Coronavirus 2 by RT PCR: NEGATIVE

## 2022-02-19 LAB — APTT: aPTT: 27 seconds (ref 24–36)

## 2022-02-19 LAB — GLUCOSE, CAPILLARY
Glucose-Capillary: 292 mg/dL — ABNORMAL HIGH (ref 70–99)
Glucose-Capillary: 104 mg/dL — ABNORMAL HIGH (ref 70–99)

## 2022-02-19 MED ORDER — ONDANSETRON HCL 4 MG PO TABS: 4.0000 mg | ORAL_TABLET | Freq: Four times a day (QID) | ORAL | Status: DC | PRN

## 2022-02-19 MED ORDER — TOPIRAMATE 100 MG PO TABS
200.0000 mg | ORAL_TABLET | Freq: Every day | ORAL | Status: DC
  Administered 2022-02-20 – 2022-02-23 (×4): 200 mg via ORAL
  Filled 2022-02-19 (×6): qty 2

## 2022-02-19 MED ORDER — SODIUM CHLORIDE 0.9 % IV SOLN
250.0000 mL | INTRAVENOUS | Status: DC | PRN
Start: 2022-02-19 — End: 2022-02-24

## 2022-02-19 MED ORDER — LEVOFLOXACIN IN D5W 750 MG/150ML IV SOLN: 750.0000 mg | Freq: Once | INTRAVENOUS | Status: DC

## 2022-02-19 MED ORDER — VANCOMYCIN HCL 2000 MG/400ML IV SOLN
2000.0000 mg | Freq: Once | INTRAVENOUS | Status: AC
  Administered 2022-02-19: 2000 mg via INTRAVENOUS
  Filled 2022-02-19: qty 400

## 2022-02-19 MED ORDER — SIMVASTATIN 20 MG PO TABS
10.0000 mg | ORAL_TABLET | Freq: Every day | ORAL | Status: DC
  Administered 2022-02-19 – 2022-02-23 (×5): 10 mg via ORAL
  Filled 2022-02-19 (×5): qty 1

## 2022-02-19 MED ORDER — INSULIN ASPART 100 UNIT/ML IJ SOLN
0.0000 [IU] | Freq: Three times a day (TID) | INTRAMUSCULAR | Status: DC
  Administered 2022-02-19: 8 [IU] via SUBCUTANEOUS
  Administered 2022-02-20: 3 [IU] via SUBCUTANEOUS
  Administered 2022-02-20: 2 [IU] via SUBCUTANEOUS

## 2022-02-19 MED ORDER — LORATADINE 10 MG PO TABS
10.0000 mg | ORAL_TABLET | Freq: Every day | ORAL | Status: DC
Start: 2022-02-20 — End: 2022-02-24
  Administered 2022-02-19 – 2022-02-24 (×6): 10 mg via ORAL
  Filled 2022-02-19 (×6): qty 1

## 2022-02-19 MED ORDER — INSULIN ASPART 100 UNIT/ML IJ SOLN: 0.0000 [IU] | Freq: Every day | INTRAMUSCULAR | Status: DC

## 2022-02-19 MED ORDER — INSULIN ASPART 100 UNIT/ML IJ SOLN
16.0000 [IU] | Freq: Three times a day (TID) | INTRAMUSCULAR | Status: DC
  Administered 2022-02-19 – 2022-02-20 (×3): 16 [IU] via SUBCUTANEOUS

## 2022-02-19 MED ORDER — MONTELUKAST SODIUM 10 MG PO TABS
10.0000 mg | ORAL_TABLET | Freq: Every day | ORAL | Status: DC
  Administered 2022-02-19 – 2022-02-24 (×6): 10 mg via ORAL
  Filled 2022-02-19 (×6): qty 1

## 2022-02-19 MED ORDER — PANCRELIPASE (LIP-PROT-AMYL) 12000-38000 UNITS PO CPEP
24000.0000 [IU] | ORAL_CAPSULE | Freq: Three times a day (TID) | ORAL | Status: DC
  Administered 2022-02-19 – 2022-02-24 (×14): 24000 [IU] via ORAL
  Filled 2022-02-19 (×13): qty 2

## 2022-02-19 MED ORDER — CYCLOBENZAPRINE HCL 10 MG PO TABS
10.0000 mg | ORAL_TABLET | Freq: Three times a day (TID) | ORAL | Status: DC | PRN
  Administered 2022-02-20 – 2022-02-22 (×2): 10 mg via ORAL
  Filled 2022-02-19 (×2): qty 1

## 2022-02-19 MED ORDER — SODIUM CHLORIDE 0.9 % IV BOLUS
2000.0000 mL | Freq: Once | INTRAVENOUS | Status: AC
Start: 2022-02-19 — End: 2022-02-19
  Administered 2022-02-19: 2000 mL via INTRAVENOUS

## 2022-02-19 MED ORDER — LINACLOTIDE 72 MCG PO CAPS
72.0000 ug | ORAL_CAPSULE | Freq: Every day | ORAL | Status: DC
  Administered 2022-02-20 – 2022-02-24 (×5): 72 ug via ORAL
  Filled 2022-02-19 (×7): qty 1

## 2022-02-19 MED ORDER — ONDANSETRON 4 MG PO TBDP
8.0000 mg | ORAL_TABLET | Freq: Three times a day (TID) | ORAL | Status: DC
  Administered 2022-02-19 – 2022-02-24 (×13): 8 mg via ORAL
  Filled 2022-02-19 (×13): qty 2

## 2022-02-19 MED ORDER — PROPRANOLOL HCL 20 MG PO TABS
40.0000 mg | ORAL_TABLET | Freq: Two times a day (BID) | ORAL | Status: DC
  Administered 2022-02-19 – 2022-02-24 (×8): 40 mg via ORAL
  Filled 2022-02-19 (×10): qty 2

## 2022-02-19 MED ORDER — PRAZOSIN HCL 5 MG PO CAPS
5.0000 mg | ORAL_CAPSULE | Freq: Every day | ORAL | Status: DC
  Administered 2022-02-19 – 2022-02-23 (×5): 5 mg via ORAL
  Filled 2022-02-19 (×7): qty 1

## 2022-02-19 MED ORDER — TOPIRAMATE 100 MG PO TABS
100.0000 mg | ORAL_TABLET | Freq: Two times a day (BID) | ORAL | Status: DC
  Administered 2022-02-19 – 2022-02-21 (×5): 100 mg via ORAL
  Filled 2022-02-19 (×5): qty 1

## 2022-02-19 MED ORDER — ACETAMINOPHEN 325 MG PO TABS: 650.0000 mg | ORAL_TABLET | Freq: Four times a day (QID) | ORAL | Status: DC | PRN

## 2022-02-19 MED ORDER — HYDROXYZINE HCL 25 MG PO TABS
25.0000 mg | ORAL_TABLET | Freq: Four times a day (QID) | ORAL | Status: DC
  Administered 2022-02-19 – 2022-02-24 (×19): 25 mg via ORAL
  Filled 2022-02-19 (×19): qty 1

## 2022-02-19 MED ORDER — PANCRELIPASE (LIP-PROT-AMYL) 12000-38000 UNITS PO CPEP
12000.0000 [IU] | ORAL_CAPSULE | ORAL | Status: DC
  Administered 2022-02-20 – 2022-02-21 (×5): 12000 [IU] via ORAL
  Filled 2022-02-19 (×5): qty 1

## 2022-02-19 MED ORDER — VANCOMYCIN HCL 1250 MG/250ML IV SOLN
1250.0000 mg | Freq: Two times a day (BID) | INTRAVENOUS | Status: DC
  Administered 2022-02-20 – 2022-02-24 (×9): 1250 mg via INTRAVENOUS
  Filled 2022-02-19 (×9): qty 250

## 2022-02-19 MED ORDER — INSULIN GLARGINE-YFGN 100 UNIT/ML ~~LOC~~ SOLN
50.0000 [IU] | Freq: Every day | SUBCUTANEOUS | Status: DC
Start: 2022-02-19 — End: 2022-02-20
  Administered 2022-02-19: 50 [IU] via SUBCUTANEOUS
  Filled 2022-02-19 (×2): qty 0.5

## 2022-02-19 MED ORDER — PREGABALIN 75 MG PO CAPS
200.0000 mg | ORAL_CAPSULE | Freq: Two times a day (BID) | ORAL | Status: DC
  Administered 2022-02-19 – 2022-02-24 (×10): 200 mg via ORAL
  Filled 2022-02-19 (×10): qty 1

## 2022-02-19 MED ORDER — ENOXAPARIN SODIUM 40 MG/0.4ML IJ SOSY
40.0000 mg | PREFILLED_SYRINGE | INTRAMUSCULAR | Status: DC
  Administered 2022-02-19 – 2022-02-23 (×5): 40 mg via SUBCUTANEOUS
  Filled 2022-02-19 (×7): qty 0.4

## 2022-02-19 MED ORDER — FLUTICASONE PROPIONATE 50 MCG/ACT NA SUSP
1.0000 | Freq: Every day | NASAL | Status: DC
  Administered 2022-02-19 – 2022-02-23 (×5): 1 via NASAL
  Filled 2022-02-19 (×2): qty 16

## 2022-02-19 MED ORDER — MEDIHONEY WOUND/BURN DRESSING EX PSTE
1.0000 "application " | PASTE | Freq: Every day | CUTANEOUS | Status: DC
  Administered 2022-02-20 – 2022-02-23 (×4): 1 via TOPICAL
  Filled 2022-02-19: qty 44

## 2022-02-19 MED ORDER — ANAGRELIDE HCL 0.5 MG PO CAPS
1.0000 mg | ORAL_CAPSULE | Freq: Two times a day (BID) | ORAL | Status: DC
  Administered 2022-02-20 – 2022-02-24 (×7): 1 mg via ORAL
  Filled 2022-02-19 (×10): qty 2
  Filled 2022-02-19: qty 8
  Filled 2022-02-19 (×5): qty 2

## 2022-02-19 MED ORDER — PANTOPRAZOLE SODIUM 40 MG PO TBEC
40.0000 mg | DELAYED_RELEASE_TABLET | Freq: Every day | ORAL | Status: DC
  Administered 2022-02-19 – 2022-02-24 (×6): 40 mg via ORAL
  Filled 2022-02-19 (×6): qty 1

## 2022-02-19 MED ORDER — TAMSULOSIN HCL 0.4 MG PO CAPS
0.4000 mg | ORAL_CAPSULE | Freq: Every day | ORAL | Status: DC
  Administered 2022-02-19 – 2022-02-24 (×6): 0.4 mg via ORAL
  Filled 2022-02-19 (×7): qty 1

## 2022-02-19 MED ORDER — ZOLPIDEM TARTRATE 5 MG PO TABS
5.0000 mg | ORAL_TABLET | Freq: Every evening | ORAL | Status: DC | PRN
  Administered 2022-02-19 – 2022-02-23 (×5): 5 mg via ORAL
  Filled 2022-02-19 (×5): qty 1

## 2022-02-19 MED ORDER — MOMETASONE FURO-FORMOTEROL FUM 200-5 MCG/ACT IN AERO
2.0000 | INHALATION_SPRAY | Freq: Two times a day (BID) | RESPIRATORY_TRACT | Status: DC
Start: 2022-02-19 — End: 2022-02-24
  Administered 2022-02-19 – 2022-02-24 (×10): 2 via RESPIRATORY_TRACT
  Filled 2022-02-19: qty 8.8

## 2022-02-19 MED ORDER — ONDANSETRON HCL 4 MG/2ML IJ SOLN
4.0000 mg | Freq: Four times a day (QID) | INTRAMUSCULAR | Status: DC | PRN
Start: 2022-02-19 — End: 2022-02-24

## 2022-02-19 MED ORDER — LEVETIRACETAM 500 MG PO TABS
750.0000 mg | ORAL_TABLET | Freq: Two times a day (BID) | ORAL | Status: DC
  Administered 2022-02-19 – 2022-02-24 (×10): 750 mg via ORAL
  Filled 2022-02-19 (×10): qty 1

## 2022-02-19 MED ORDER — CARIPRAZINE HCL 1.5 MG PO CAPS
3.0000 mg | ORAL_CAPSULE | Freq: Every day | ORAL | Status: DC
  Administered 2022-02-20 – 2022-02-24 (×5): 3 mg via ORAL
  Filled 2022-02-19 (×8): qty 2

## 2022-02-19 MED ORDER — SODIUM CHLORIDE 0.9% FLUSH
3.0000 mL | INTRAVENOUS | Status: DC | PRN
  Administered 2022-02-19: 3 mL via INTRAVENOUS

## 2022-02-19 MED ORDER — SODIUM CHLORIDE 0.9% FLUSH
3.0000 mL | Freq: Two times a day (BID) | INTRAVENOUS | Status: DC
  Administered 2022-02-19 – 2022-02-23 (×9): 3 mL via INTRAVENOUS

## 2022-02-19 MED ORDER — VORTIOXETINE HBR 20 MG PO TABS
20.0000 mg | ORAL_TABLET | Freq: Every day | ORAL | Status: DC
  Administered 2022-02-20 – 2022-02-24 (×5): 20 mg via ORAL
  Filled 2022-02-19 (×8): qty 1

## 2022-02-19 MED ORDER — VANCOMYCIN HCL IN DEXTROSE 1-5 GM/200ML-% IV SOLN: 1000.0000 mg | Freq: Once | INTRAVENOUS | Status: DC

## 2022-02-19 MED ORDER — ARIPIPRAZOLE 10 MG PO TABS
20.0000 mg | ORAL_TABLET | Freq: Every day | ORAL | Status: DC
  Administered 2022-02-19 – 2022-02-23 (×5): 20 mg via ORAL
  Filled 2022-02-19 (×5): qty 2

## 2022-02-19 NOTE — Sepsis Progress Note (Signed)
ELink monitoring sepsis protocol 

## 2022-02-19 NOTE — Assessment & Plan Note (Addendum)
Continue Keppra

## 2022-02-19 NOTE — Progress Notes (Signed)
Pharmacy Antibiotic Note ? ?Dennis Yang is a 36 y.o. male admitted on 02/19/2022 with cellulitis.  Pharmacy has been consulted for vancomycin dosing. ? ?Plan: ?Vancomycin 2000 mg IV x 1 dose. ?Vancomycin 1250 mg IV every 12 hours. ?Monitor labs, c/s, and vanco level as indicated. ? ?Height: 6' (182.9 cm) ?Weight: 84.9 kg (187 lb 2.7 oz) ?IBW/kg (Calculated) : 77.6 ? ?Temp (24hrs), Avg:98 ?F (36.7 ?C), Min:98 ?F (36.7 ?C), Max:98 ?F (36.7 ?C) ? ?Recent Labs  ?Lab 02/19/22 ?1243 02/19/22 ?1257  ?WBC 11.3*  --   ?CREATININE 1.20 1.20  ?  ?Estimated Creatinine Clearance: 96.1 mL/min (by C-G formula based on SCr of 1.2 mg/dL).   ? ?Allergies  ?Allergen Reactions  ? Ciprofloxacin Hives  ? ? ?Antimicrobials this admission: ?Vanco 3/13 >> ? ? ?Microbiology results: ?3/13 BCx: pending ?3/13 UCx: pending  ? ? ?Thank you for allowing pharmacy to be a part of this patient?s care. ? ?Judeth Cornfield, PharmD ?Clinical Pharmacist ?02/19/2022 1:46 PM ? ? ?

## 2022-02-19 NOTE — ED Triage Notes (Signed)
Referred from PCP for evaluation of left foot ulcer and IV antibiotics ?

## 2022-02-19 NOTE — Assessment & Plan Note (Signed)
PPI ?

## 2022-02-19 NOTE — Assessment & Plan Note (Addendum)
Continue IV vancomycin Monitor blood cultures Wound care consultation Obtain ABI Monitor CBC

## 2022-02-19 NOTE — Assessment & Plan Note (Signed)
SSI Continue home medications

## 2022-02-19 NOTE — ED Notes (Signed)
Dr. Sherryll Burger assessing pt. Pt attempting to urinate with urinal. ?

## 2022-02-19 NOTE — H&P (Signed)
History and Physical    Patient: Dennis Yang ZJQ:[id number] DOB: [birth year] DOA: 02/19/2022 DOS: the patient was seen and examined on 02/19/2022 PCP: Tacy Learn, FNP  Patient coming from: Home  Chief Complaint:  Chief Complaint  Patient presents with   Foot Ulcer   Code Sepsis   HPI: [name] is a 36 y.o. 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  He describes some mild serosanguineous discharge and was seen by his podiatrist Friday of last week at which point labs were obtained.  Results returned this a.m. with noted leukocytosis for which he was told to come to the ED for initiation of IV antibiotics.  X-ray imaging negative for any acute findings and he is noted to have mild leukocytosis of 11,000.  He has been started on vancomycin empirically.  Review of Systems: As mentioned in the history of present illness. All other systems reviewed and are negative. Past Medical History:  Diagnosis Date   Anxiety    Back pain    Bipolar 1 disorder (HCC)    Depression    Diabetes mellitus without complication (HCC)    Incontinence of bowel    Incontinence of urine    Neck pain    Neuropathy    Osteoarthritis    Osteonecrosis (HCC)    left foot   Pancreatitis    Schizoaffective disorder (HCC)    TIA (transient ischemic attack)    Past Surgical History:  Procedure Laterality Date   botox injections in bladder     CHOLECYSTECTOMY     ELBOW FRACTURE SURGERY     FOOT SURGERY     HERNIA REPAIR     LITHOTRIPSY     NASAL SINUS SURGERY     Social History:  reports that he has quit smoking. He has never used smokeless tobacco. He reports that he does not currently use alcohol. He reports  that he does not currently use drugs.  Allergies  Allergen Reactions   Ciprofloxacin Hives    No family history on file.  Prior to Admission medications   Medication Sig Start Date End Date Taking? Authorizing Provider  acetaminophen (TYLENOL) 325 MG tablet Take 2 tablets (650 mg total) by mouth every 6 (six) hours as needed for mild pain, fever or headache (or Fever >/= 101). 08/13/19  Yes Emokpae, Courage, MD  anagrelide (AGRYLIN) 1 MG capsule Take 1 mg by mouth in the morning and at bedtime. 05/27/20  Yes [provider]  ARIPiprazole (ABILIFY) 20 MG tablet Take 20 mg by mouth at bedtime. 07/11/21  Yes [provider]  ATROVENT HFA 17 MCG/ACT inhaler Inhale 1 puff into the lungs every 4 (four) hours as needed for wheezing.  01/16/19  Yes [provider]  cetirizine (ZYRTEC) 10 MG tablet Take 10 mg by mouth daily. 11/16/19  Yes [provider]  chlorhexidine (HIBICLENS) 4 % external liquid Apply 1 application topically daily as needed (for skin irritation as directed).  01/13/20  Yes [provider]  Cholecalciferol (VITAMIN D3) 1.25 MG (50000 UT) CAPS Take 1 capsule by mouth every Monday, Wednesday, and Friday. 3 times a week 12/24/18  Yes [provider]  CREON 36000 units CPEP capsule Take 36,000-72,000 Units by mouth See admin instructions. Take 2 capsules by  mouth with meals and 1 capsule with snacks 11/16/19  Yes [provider]  cyclobenzaprine (FLEXERIL) 10 MG tablet Take 10 mg by mouth 3 (three) times daily as needed for muscle spasms. 04/19/20  Yes [provider]  diclofenac Sodium (VOLTAREN) 1 % GEL Apply 2 g topically daily as needed (for pain).  11/23/19  Yes [provider]  EMGALITY 120 MG/ML SOAJ Inject 1 Dose into the muscle every 30 (thirty) days.  11/24/18  Yes [provider]  fluticasone (FLONASE) 50 MCG/ACT nasal spray Place 1 spray into both nostrils daily.  01/20/19  Yes [provider]  fluticasone-salmeterol (ADVAIR) 250-50 MCG/ACT AEPB Inhale 1 puff into the lungs 2 (two) times daily. 07/11/21  Yes [provider]  HUMALOG KWIKPEN 100 UNIT/ML KwikPen Inject 16 Units into the skin 3 (three) times daily before meals. 03/20/21  Yes [provider]  hydrOXYzine (ATARAX/VISTARIL) 25 MG tablet Take 25 mg by mouth 4 (four) times daily. 07/11/21  Yes [provider]  insulin glargine (LANTUS) 100 UNIT/ML injection Inject 50 Units into the skin at bedtime. 06/19/20  Yes [provider]  levETIRAcetam (KEPPRA) 750 MG tablet Take 750 mg by mouth in the morning and at bedtime.   Yes [provider]  linaclotide (LINZESS) 72 MCG capsule Take 72 mcg by mouth daily. 10/14/20  Yes [provider]  meclizine (ANTIVERT) 25 MG tablet Take 25 mg by mouth every 6 (six) hours as needed for dizziness or nausea.  10/01/19  Yes [provider]  meloxicam (MOBIC) 15 MG tablet Take 15 mg by mouth daily. 07/13/20  Yes [provider]  metaxalone (SKELAXIN) 800 MG tablet Take 800 mg by mouth every 8 (eight) hours as needed for muscle spasms.  12/03/19  Yes [provider]  montelukast (SINGULAIR) 10 MG tablet Take 10 mg by mouth daily.  01/12/19  Yes [provider]  nitroGLYCERIN (NITROSTAT) 0.4 MG SL tablet Place 0.4 mg under the tongue every 5 (five) minutes as needed for chest pain.  08/18/19  Yes [provider]  omeprazole (PRILOSEC) 40 MG capsule Take 1 capsule (40 mg total) by mouth daily. 12/19/19  Yes Emokpae, Courage, MD  ondansetron (ZOFRAN-ODT) 8 MG disintegrating tablet Take 1 tablet (8 mg total) by mouth 3 (three) times daily before meals. 07/18/21  Yes Johnson, Clanford L, MD  prazosin (MINIPRESS) 5 MG capsule Take 5 mg by mouth at bedtime. 06/30/21  Yes [provider]  pregabalin (LYRICA) 200 MG capsule Take 200 mg by mouth 2 (two) times daily.   Yes [provider]  propranolol (INDERAL)  40 MG tablet Take 40 mg by mouth 2 (two) times daily.  01/12/19  Yes [provider]  simvastatin (ZOCOR) 20 MG tablet Take 10 mg by mouth daily at 6 PM.  12/25/18  Yes [provider]  sucralfate (CARAFATE) 1 g tablet Take 1 g by mouth 3 (three) times daily. 07/11/21  Yes [provider]  topiramate (TOPAMAX) 100 MG tablet Take 100-200 mg by mouth 3 (three) times daily. Take 100 mg in the morning and at noon. Take 200 mg at bedtime 05/18/19  Yes [provider]  UBRELVY 100 MG TABS Take 100 mg by mouth daily as needed (migraines). 07/11/21  Yes [provider]  zolpidem (AMBIEN) 5 MG tablet Take 5 mg by mouth at bedtime as needed for sleep. 07/11/21  Yes [provider]  lidocaine (XYLOCAINE) 4 % external solution Apply topically daily  as needed for mild pain or moderate pain.  11/23/19   [provider]  tamsulosin (FLOMAX) 0.4 MG CAPS capsule Take 0.4 mg by mouth daily. 02/02/22   [provider]  TRINTELLIX 20 MG TABS tablet Take 20 mg by mouth daily. 01/12/22   [provider]  VRAYLAR 3 MG capsule Take 3 mg by mouth daily. 02/01/22   [provider]    Physical Exam: Vitals:   02/19/22 1415 02/19/22 1445 02/19/22 1505 02/19/22 1540  BP: 93/62 101/64 100/65 117/69  Pulse: 80 90 85 89  Resp: 14 18 17 16   Temp:    97.7 F (36.5 C)  TempSrc:    Oral  SpO2: 99% 100% 99% 100%  Weight:    86.8 kg  Height:    6' (1.829 m)   General exam: Appears calm and comfortable  Respiratory system: Clear to auscultation. Respiratory effort normal. Cardiovascular system: S1 & S2 heard, RRR.  Gastrointestinal system: Abdomen is soft Central nervous system: Alert and awake Extremities: No edema L heel ulcer noted:   Skin: No significant lesions noted Psychiatry: Flat affect.  Data Reviewed:  There are no new results to review at this time.  Assessment and Plan: * Cellulitis and abscess of leg Continue IV  vancomycin Monitor blood cultures Wound care consultation Obtain ABI Monitor CBC  Breakthrough Seizure (HCC) Continue Keppra  GERD (gastroesophageal reflux disease) PPI  Type 2 diabetes mellitus without complication (HCC) SSI Continue home medications  Bipolar 1 disorder/BiPolar disorder/depression/PTSD/anxiety.-- Continue home medications     Advance Care Planning: Full  Consults: None  Family Communication: Father at bedside  Severity of Illness: The appropriate patient status for this patient is INPATIENT. Inpatient status is judged to be reasonable and necessary in order to provide the required intensity of service to ensure the patient's safety. The patient's presenting symptoms, physical exam findings, and initial radiographic and laboratory data in the context of their chronic comorbidities is felt to place them at high risk for further clinical deterioration. Furthermore, it is not anticipated that the patient will be medically stable for discharge from the hospital within 2 midnights of admission.   * I certify that at the point of admission it is my clinical judgment that the patient will require inpatient hospital care spanning beyond 2 midnights from the point of admission due to high intensity of service, high risk for further deterioration and high frequency of surveillance required.*  Author: Erick BlinksPratik D Anniah Glick, DO 02/19/2022 4:05 PM  For on call review [email or web address].

## 2022-02-19 NOTE — ED Notes (Signed)
US at bedside

## 2022-02-19 NOTE — ED Provider Notes (Incomplete)
[facility] EMERGENCY DEPARTMENT Provider Note   CSN: [id number] Arrival date & time: 02/19/22  1144     History {Add pertinent medical, surgical, social history, OB history to HPI:1} Chief Complaint  Patient presents with   Foot Ulcer   Code Sepsis    Dennis Yang is a 36 y.o. male.   patient with history of diabetes and he has developed ulcer to his right foot.  Now he has redness and tenderness to that foot.   Rash     Home Medications Prior to Admission medications   Medication Sig Start Date End Date Taking? Authorizing Provider  acetaminophen (TYLENOL) 325 MG tablet Take 2 tablets (650 mg total) by mouth every 6 (six) hours as needed for mild pain, fever or headache (or Fever >/= 101). 08/13/19   Emokpae, Courage, MD  anagrelide (AGRYLIN) 1 MG capsule Take 1 mg by mouth in the morning and at bedtime. 05/27/20   [provider]  ARIPiprazole (ABILIFY) 20 MG tablet Take 20 mg by mouth at bedtime. 07/11/21   [provider]  ATROVENT HFA 17 MCG/ACT inhaler Inhale 1 puff into the lungs every 4 (four) hours as needed for wheezing.  01/16/19   [provider]  celecoxib (CELEBREX) 100 MG capsule Take 100 mg by mouth 2 (two) times daily. 12/25/19   [provider]  cetirizine (ZYRTEC) 10 MG tablet Take 10 mg by mouth daily. 11/16/19   [provider]  chlorhexidine (HIBICLENS) 4 % external liquid Apply 1 application topically daily as needed (for skin irritation as directed).  01/13/20   [provider]  Cholecalciferol (VITAMIN D3) 1.25 MG (50000 UT) CAPS Take 1 capsule by mouth every Monday, Wednesday, and Friday. 3 times a week 12/24/18   [provider]  CREON 36000 units CPEP capsule Take 36,000-72,000 Units by mouth See admin instructions. Take 2 capsules by mouth with meals and 1 capsule with snacks 11/16/19   [provider]  cyclobenzaprine (FLEXERIL) 10 MG tablet Take 10 mg by mouth 3 (three) times daily as needed  for muscle spasms. 04/19/20   [provider]  diclofenac Sodium (VOLTAREN) 1 % GEL Apply 2 g topically daily as needed (for pain).  11/23/19   [provider]  EMGALITY 120 MG/ML SOAJ Inject 1 Dose into the muscle every 30 (thirty) days.  11/24/18   [provider]  escitalopram (LEXAPRO) 20 MG tablet Take 20 mg by mouth daily. (Take with 5 mg tab; total 25 mg daily) 01/22/19   [provider]  escitalopram (LEXAPRO) 5 MG tablet Take 5 mg by mouth daily. (Take with 20 mg tab; total 25 mg daily) 07/11/21   [provider]  famotidine (PEPCID) 20 MG tablet Take 20 mg by mouth 3 (three) times daily as needed for heartburn or indigestion.  11/16/19   [provider]  fluticasone (FLONASE) 50 MCG/ACT nasal spray Place 1 spray into both nostrils daily.  01/20/19   [provider]  fluticasone-salmeterol (ADVAIR) 250-50 MCG/ACT AEPB Inhale 1 puff into the lungs 2 (two) times daily. 07/11/21   [provider]  HUMALOG KWIKPEN 100 UNIT/ML KwikPen Inject 16 Units into the skin 3 (three) times daily before meals. 03/20/21   [provider]  hydrOXYzine (ATARAX/VISTARIL) 25 MG tablet Take 25 mg by mouth 4 (four) times daily. 07/11/21   [provider]  insulin glargine (LANTUS) 100 UNIT/ML injection Inject 50 Units into the skin at bedtime. 06/19/20   [provider]  levETIRAcetam (KEPPRA) 750 MG tablet Take 750 mg by mouth in the morning and at bedtime.    [provider]  lidocaine (XYLOCAINE) 4 % external solution Apply topically daily as needed for mild pain or moderate pain.  11/23/19   [provider]  linaclotide (LINZESS) 72 MCG capsule Take 72 mcg by mouth daily. 10/14/20   [provider]  meclizine (ANTIVERT) 25 MG tablet Take 25 mg by mouth every 6 (six) hours as needed for dizziness or nausea.  10/01/19   [provider]  meloxicam (MOBIC) 15 MG tablet Take 15 mg by mouth daily.  07/13/20   [provider]  metaxalone (SKELAXIN) 800 MG tablet Take 800 mg by mouth every 8 (eight) hours as needed for muscle spasms.  12/03/19   [provider]  mirabegron ER (MYRBETRIQ) 50 MG TB24 tablet Take 50 mg by mouth daily. 12/20/20   [provider]  montelukast (SINGULAIR) 10 MG tablet Take 10 mg by mouth daily.  01/12/19   [provider]  nitroGLYCERIN (NITROSTAT) 0.4 MG SL tablet Place 0.4 mg under the tongue every 5 (five) minutes as needed for chest pain.  08/18/19   [provider]  omeprazole (PRILOSEC) 40 MG capsule Take 1 capsule (40 mg total) by mouth daily. 12/19/19   Shon Hale, MD  ondansetron (ZOFRAN-ODT) 8 MG disintegrating tablet Take 1 tablet (8 mg total) by mouth 3 (three) times daily before meals. 07/18/21   Johnson, Clanford L, MD  prazosin (MINIPRESS) 5 MG capsule Take 5 mg by mouth at bedtime. 06/30/21   [provider]  pregabalin (LYRICA) 200 MG capsule Take 200 mg by mouth 2 (two) times daily.    [provider]  propranolol (INDERAL) 40 MG tablet Take 40 mg by mouth 2 (two) times daily.  01/12/19   [provider]  simvastatin (ZOCOR) 20 MG tablet Take 10 mg by mouth daily at 6 PM.  12/25/18   [provider]  sucralfate (CARAFATE) 1 g tablet Take 1 g by mouth 3 (three) times daily. 07/11/21   [provider]  topiramate (TOPAMAX) 100 MG tablet Take 100-200 mg by mouth 3 (three) times daily. Take 100 mg in the morning and at noon. Take 200 mg at bedtime 05/18/19   [provider]  UBRELVY 100 MG TABS Take 100 mg by mouth daily as needed (migraines). 07/11/21   [provider]  XIIDRA 5 % SOLN Place 2 drops into both eyes daily.  01/21/19   [provider]  zolpidem (AMBIEN) 5 MG tablet Take 5 mg by mouth at bedtime as needed for sleep. 07/11/21   [provider]      Allergies    Ciprofloxacin    Review of Systems   Review of Systems  Skin:   Positive for rash.   Physical Exam Updated Vital Signs BP 103/70    Pulse 88    Temp 98 F (36.7 C) (Oral)    Resp 12    Ht 6' (1.829 m)    Wt 84.9 kg    SpO2 100%    BMI 25.38 kg/m  Physical Exam  ED Results / Procedures / Treatments   Labs (all labs ordered are listed, but only abnormal results are displayed) Labs Reviewed  COMPREHENSIVE METABOLIC PANEL - Abnormal; Notable for the following components:      Result Value   Chloride 117 (*)    CO2 18 (*)    Glucose, Bld 297 (*)  BUN 22 (*)    AST 54 (*)    Alkaline Phosphatase 131 (*)    All other components within normal limits  CBC WITH DIFFERENTIAL/PLATELET - Abnormal; Notable for the following components:   WBC 11.3 (*)    RBC 3.95 (*)    Hemoglobin 12.3 (*)    HCT 37.5 (*)    All other components within normal limits  I-STAT CHEM 8, ED - Abnormal; Notable for the following components:   Chloride 114 (*)    BUN 21 (*)    Glucose, Bld 287 (*)    TCO2 19 (*)    Hemoglobin 12.6 (*)    HCT 37.0 (*)    All other components within normal limits  CULTURE, BLOOD (ROUTINE X 2)  CULTURE, BLOOD (ROUTINE X 2)  RESP PANEL BY RT-PCR (FLU A&B, COVID) ARPGX2  URINE CULTURE  LACTIC ACID, PLASMA  PROTIME-INR  APTT  LACTIC ACID, PLASMA  URINALYSIS, ROUTINE W REFLEX MICROSCOPIC    EKG None  Radiology DG Chest Port 1 View  Result Date: 02/19/2022 CLINICAL DATA:  Suspected sepsis, heel wound EXAM: PORTABLE CHEST 1 VIEW COMPARISON:  07/13/2021 FINDINGS: The heart size and mediastinal contours are within normal limits. Right chest port catheter. Both lungs are clear. The visualized skeletal structures are unremarkable. IMPRESSION: No acute abnormality of the lungs in AP portable projection. Electronically Signed   By: Jearld LeschAlex D Bibbey M.D.   On: 02/19/2022 13:12   DG Foot Complete Left  Result Date: 02/19/2022 CLINICAL DATA:  Pain. Suspected sepsis. Wound to medial aspect of the left heel. History of diabetes. EXAM: LEFT FOOT -  COMPLETE 3+ VIEW COMPARISON:  Left foot radiographs 08/24/2021 FINDINGS: Postsurgical changes are again seen of dorsal plate and screw instrumented first metatarsophalangeal joint arthrodesis. Solid osseous fusion is again seen. No evidence of hardware failure. Otherwise joint spaces are preserved. No dislocation. Normal variant os naviculare. IMPRESSION: No significant change from prior. First metatarsophalangeal joint arthrodesis without hardware failure. Electronically Signed   By: Neita Garnetonald  Viola M.D.   On: 02/19/2022 13:34    Procedures Procedures  {Document cardiac monitor, telemetry assessment procedure when appropriate:1}  Medications Ordered in ED Medications  vancomycin (VANCOREADY) IVPB 2000 mg/400 mL (2,000 mg Intravenous New Bag/Given 02/19/22 1305)  vancomycin (VANCOREADY) IVPB 1250 mg/250 mL (has no administration in time range)  sodium chloride 0.9 % bolus 2,000 mL (2,000 mLs Intravenous New Bag/Given 02/19/22 1305)    ED Course/ Medical Decision Making/ A&P  CRITICAL CARE Performed by: Bethann BerkshireJoseph Trei Schoch Total critical care time: 45 minutes Critical care time was exclusive of separately billable procedures and treating other patients. Critical care was necessary to treat or prevent imminent or life-threatening deterioration. Critical care was time spent personally by me on the following activities: development of treatment plan with patient and/or surrogate as well as nursing, discussions with consultants, evaluation of patient's response to treatment, examination of patient, obtaining history from patient or surrogate, ordering and performing treatments and interventions, ordering and review of laboratory studies, ordering and review of radiographic studies, pulse oximetry and re-evaluation of patient's condition.                          Medical Decision Making Amount and/or Complexity of Data Reviewed Labs: ordered. Radiology: ordered. ECG/medicine tests:  ordered.  Risk Prescription drug management. Decision regarding hospitalization.   Patient with cellulitis to right foot.  He is started on IV antibiotics and will be admitted to  medicine  {Document critical care time when appropriate:1} {Document review of labs and clinical decision tools ie heart score, Chads2Vasc2 etc:1}  {Document your independent review of radiology images, and any outside records:1} {Document your discussion with family members, caretakers, and with consultants:1} {Document social determinants of health affecting pt's care:1} {Document your decision making why or why not admission, treatments were needed:1} Final Clinical Impression(s) / ED Diagnoses Final diagnoses:  Cellulitis of right foot    Rx / DC Orders ED Discharge Orders     None

## 2022-02-19 NOTE — Consult Note (Signed)
Podiatry Consult note:  ? ?Patient was seen bedside resting comfortably. Patient was seen by me at my office on 02/16/22 for new blister and open wound on the left heel. Wound cultures were obtained in the office. Xrays were done at office which were negative for any bone infection or foreign body. Patient was placed on Clindamycin and WBC was obtained. This morning the results came back and WBC was 17. However denies any signs of Nausea, vomiting, fever or chills. He does admits to chills today.  ? ?Past Medical History:  ?Diagnosis Date  ? Anxiety    ? Back pain    ? Bipolar 1 disorder (HCC)    ? Depression    ? Diabetes mellitus without complication (HCC)    ? Incontinence of bowel    ? Incontinence of urine    ? Neck pain    ? Neuropathy    ? Osteoarthritis    ? Osteonecrosis (HCC)    ?  left foot  ? Pancreatitis    ? Schizoaffective disorder (HCC)    ? TIA (transient ischemic attack)    ?  ?     ?Past Surgical History:  ?Procedure Laterality Date  ? botox injections in bladder      ? CHOLECYSTECTOMY      ? ELBOW FRACTURE SURGERY      ? FOOT SURGERY      ? HERNIA REPAIR      ? LITHOTRIPSY      ? NASAL SINUS SURGERY      ?  ?Social History:  reports that he has quit smoking. He has never used smokeless tobacco. He reports that he does not currently use alcohol. He reports that he does not currently use drugs. ? ?Allergies  ?Allergen Reactions  ? Ciprofloxacin Hives  ?  ?  ?No family history on file. ? ?Medication list: Reviewed in the chart.  ? ? ?GENERAL:  Alert,  ?INTEGUMENT:  There is blister noted with sloughing of the skin on the medial aspect of the left heel. The blister is measured to be 3.5x3.5 with serous drainage. No purulent drainage noted. There is also nonviable tissue noted in the center of the blister measuring 2.5x2.2. Periwound redness noted. No streaking redness noted. There is increase in warmth noted. No calf pain noted. There is swelling noted on the left leg.  ?VASCULAR:  [DP is palpable  bilaterally.]  [PT is palpable bilaterally.]  Edema is present.]  ?MUSCULOSKELETAL:  [Muscle tone is normal bilaterally.]  [Muscle strength is 5 out of 5 with regards to dorsiflexion, plantarflexion, inversion and eversion of both feet.] ?NEUROLOGIC:  [Light touch sensation is grossly intact bilaterally.] ? ?WBC is trending down.  ?Wound culture still pending.  ?ABI within normal limits.  ?XRAYS: negative for any soft tissue emphysema or cortical destruction in bone.  ? ? ?A: Open wound of the left heel with infected blister. ?Cellulitis of the left lower extremity.  ? ? ?P: patient was examined and evaluated.  ?I applied Aquacell Ag and betadine dressing to the left foot.  ?Continue IV antibiotics as redness appears to be resolving and less compared to 02/16/22.  ?Will order Santyl for the wound.  ?Will follow up with patient while in the hospital.  ? ? ?

## 2022-02-19 NOTE — Assessment & Plan Note (Signed)
-   Continue home medications 

## 2022-02-20 ENCOUNTER — Inpatient Hospital Stay (HOSPITAL_COMMUNITY): Payer: Medicare Other

## 2022-02-20 DIAGNOSIS — L03119 Cellulitis of unspecified part of limb: Secondary | ICD-10-CM | POA: Diagnosis not present

## 2022-02-20 DIAGNOSIS — L02419 Cutaneous abscess of limb, unspecified: Secondary | ICD-10-CM | POA: Diagnosis not present

## 2022-02-20 LAB — CBC
HCT: 32.4 % — ABNORMAL LOW (ref 39.0–52.0)
Hemoglobin: 10.4 g/dL — ABNORMAL LOW (ref 13.0–17.0)
MCH: 30.1 pg (ref 26.0–34.0)
MCHC: 32.1 g/dL (ref 30.0–36.0)
MCV: 93.9 fL (ref 80.0–100.0)
Platelets: 250 10*3/uL (ref 150–400)
RBC: 3.45 MIL/uL — ABNORMAL LOW (ref 4.22–5.81)
RDW: 14.3 % (ref 11.5–15.5)
WBC: 13.4 10*3/uL — ABNORMAL HIGH (ref 4.0–10.5)
nRBC: 0 % (ref 0.0–0.2)

## 2022-02-20 LAB — BASIC METABOLIC PANEL
Anion gap: 8 (ref 5–15)
BUN: 19 mg/dL (ref 6–20)
CO2: 18 mmol/L — ABNORMAL LOW (ref 22–32)
Calcium: 8 mg/dL — ABNORMAL LOW (ref 8.9–10.3)
Chloride: 116 mmol/L — ABNORMAL HIGH (ref 98–111)
Creatinine, Ser: 1.05 mg/dL (ref 0.61–1.24)
GFR, Estimated: >60 mL/min (ref >60)
Glucose, Bld: 117 mg/dL — ABNORMAL HIGH (ref 70–99)
Potassium: 3.4 mmol/L — ABNORMAL LOW (ref 3.5–5.1)
Sodium: 142 mmol/L (ref 135–145)

## 2022-02-20 LAB — GLUCOSE, CAPILLARY
Glucose-Capillary: 140 mg/dL — ABNORMAL HIGH (ref 70–99)
Glucose-Capillary: 161 mg/dL — ABNORMAL HIGH (ref 70–99)
Glucose-Capillary: 42 mg/dL — CL (ref 70–99)
Glucose-Capillary: 69 mg/dL — ABNORMAL LOW (ref 70–99)
Glucose-Capillary: 141 mg/dL — ABNORMAL HIGH (ref 70–99)
Glucose-Capillary: 299 mg/dL — ABNORMAL HIGH (ref 70–99)

## 2022-02-20 LAB — HEMOGLOBIN A1C
Hgb A1c MFr Bld: 9.1 % — ABNORMAL HIGH (ref 4.8–5.6)
Mean Plasma Glucose: 214 mg/dL

## 2022-02-20 LAB — MAGNESIUM: Magnesium: 1.8 mg/dL (ref 1.7–2.4)

## 2022-02-20 MED ORDER — INSULIN ASPART 100 UNIT/ML IJ SOLN
0.0000 [IU] | Freq: Three times a day (TID) | INTRAMUSCULAR | Status: DC
  Administered 2022-02-21: 5 [IU] via SUBCUTANEOUS
  Administered 2022-02-21: 3 [IU] via SUBCUTANEOUS
  Administered 2022-02-21: 2 [IU] via SUBCUTANEOUS
  Administered 2022-02-22: 9 [IU] via SUBCUTANEOUS
  Administered 2022-02-23 (×2): 3 [IU] via SUBCUTANEOUS

## 2022-02-20 MED ORDER — INSULIN ASPART 100 UNIT/ML IJ SOLN
0.0000 [IU] | Freq: Every day | INTRAMUSCULAR | Status: DC
  Administered 2022-02-20 – 2022-02-22 (×2): 2 [IU] via SUBCUTANEOUS
  Administered 2022-02-23: 3 [IU] via SUBCUTANEOUS

## 2022-02-20 MED ORDER — INSULIN GLARGINE-YFGN 100 UNIT/ML ~~LOC~~ SOLN
25.0000 [IU] | Freq: Every day | SUBCUTANEOUS | Status: DC
Start: 2022-02-20 — End: 2022-02-20
  Filled 2022-02-20: qty 0.25

## 2022-02-20 MED ORDER — POTASSIUM CHLORIDE CRYS ER 20 MEQ PO TBCR
40.0000 meq | EXTENDED_RELEASE_TABLET | Freq: Once | ORAL | Status: AC
  Administered 2022-02-20: 40 meq via ORAL
  Filled 2022-02-20: qty 2

## 2022-02-20 MED ORDER — INSULIN GLARGINE-YFGN 100 UNIT/ML ~~LOC~~ SOLN
35.0000 [IU] | Freq: Every day | SUBCUTANEOUS | Status: DC
  Administered 2022-02-20: 35 [IU] via SUBCUTANEOUS
  Filled 2022-02-20 (×2): qty 0.35

## 2022-02-20 MED ORDER — GADOBUTROL 1 MMOL/ML IV SOLN
7.0000 mL | Freq: Once | INTRAVENOUS | Status: AC | PRN
  Administered 2022-02-20: 7 mL via INTRAVENOUS

## 2022-02-20 NOTE — Progress Notes (Signed)
Inpatient Diabetes Program Recommendations ? ?AACE/ADA: New Consensus Statement on Inpatient Glycemic Control (2015) ? ?Target Ranges:  Prepandial:   less than 140 mg/dL ?     Peak postprandial:   less than 180 mg/dL (1-2 hours) ?     Critically ill patients:  140 - 180 mg/dL  ? ?Lab Results  ?Component Value Date  ? GLUCAP 42 (LL) 02/20/2022  ? HGBA1C 12.6 (H) 07/13/2021  ? ? ?Review of Glycemic Control ? ?Diabetes history: DM2 ?Outpatient Diabetes medications: Lantus 50 QHS, Humalog 16 units TID ?Current orders for Inpatient glycemic control: Semglee 50 QHS, Novolog 0-15 TID with meals and 0-5 HS + 16 units TID ? ?Hypoglycemia of 42 before dinner. ?Blood sugars have been running on the low side of normal ?HgbA1C pending ? ?Inpatient Diabetes Program Recommendations:   ? ?Decrease Semglee to 35 units QHS ?Decrease Novolog to 8 units TID with meals ?Decrease Novolog to 0-9 units TID with meals and 0-5 HS ? ?Secure text sent to MD. ? ?Will continue to follow. ? ?Thank you. ?Ailene Ards, RD, LDN, CDE ?Inpatient Diabetes Coordinator ?[phone]  ? ? ? ? ?

## 2022-02-20 NOTE — Progress Notes (Signed)
PODIATRY PROGRESS NOTE  SUBJECTIVE Dennis Yang is a 36 y.o. male was evaluated by Dr. Allena Katz on 02/16/2022 for blister/open wound L heel.  He obtained labs and started the patient on clindamycin.  WBC returned on 02/19/2022 at 17.  Dr. Allena Katz referred patient to ED for admission/IV antibiotics.  Dennis Yang is "doing okay" today.  Denies any nausea, vomiting, fever or chills.  Mother and father are present at bedside.    Past Medical History:  Diagnosis Date   Anxiety    Back pain    Bipolar 1 disorder (HCC)    Depression    Diabetes mellitus without complication (HCC)    Incontinence of bowel    Incontinence of urine    Neck pain    Neuropathy    Osteoarthritis    Osteonecrosis (HCC)    left foot   Pancreatitis    Schizoaffective disorder (HCC)    TIA (transient ischemic attack)    Scheduled Meds:  anagrelide  1 mg Oral BID   ARIPiprazole  20 mg Oral QHS   cariprazine  3 mg Oral Daily   enoxaparin (LOVENOX) injection  40 mg Subcutaneous Q24H   fluticasone  1 spray Each Nare Daily   hydrOXYzine  25 mg Oral QID   insulin aspart  0-15 Units Subcutaneous TID WC   insulin aspart  0-5 Units Subcutaneous QHS   insulin aspart  16 Units Subcutaneous TID AC   insulin glargine-yfgn  50 Units Subcutaneous QHS   leptospermum manuka honey  1 application. Topical Daily   levETIRAcetam  750 mg Oral BID   linaclotide  72 mcg Oral Daily   lipase/protease/amylase  12,000 Units Oral With snacks   lipase/protease/amylase  24,000 Units Oral TID with meals   loratadine  10 mg Oral Daily   mometasone-formoterol  2 puff Inhalation BID   montelukast  10 mg Oral Daily   ondansetron  8 mg Oral TID AC   pantoprazole  40 mg Oral Daily   prazosin  5 mg Oral QHS   pregabalin  200 mg Oral BID   propranolol  40 mg Oral BID   simvastatin  10 mg Oral q1800   sodium chloride flush  3 mL Intravenous Q12H   tamsulosin  0.4 mg Oral Daily   topiramate  100 mg Oral BID   topiramate  200 mg Oral QHS    vortioxetine HBr  20 mg Oral Daily   Continuous Infusions:  sodium chloride     vancomycin 1,250 mg (02/20/22 1356)   PRN Meds:.sodium chloride, acetaminophen, cyclobenzaprine, ondansetron **OR** ondansetron (ZOFRAN) IV, sodium chloride flush, zolpidem  Allergies  Allergen Reactions   Ciprofloxacin Hives    PHYSICAL EXAMINATION Vital signs in last 24 hours:   Temp:  [97.6 F (36.4 C)-97.8 F (36.6 C)] 97.6 F (36.4 C) (03/14 1408) Pulse Rate:  [61-90] 82 (03/14 1408) Resp:  [15-18] 18 (03/14 1408) BP: (100-123)/(65-86) 112/72 (03/14 1408) SpO2:  [97 %-100 %] 97 % (03/14 1408) Weight:  [86.8 kg] 86.8 kg (03/13 1540)  DRESSING:  Intact, L foot without strikethrough. INTEGUMENT:  3.8 x 3.8 cm open wound with 2.7 x 2.0 cm central area of fibortic tissue with some necrosis present along medial L heel.  Focal area of fluctuance centrally.  Expressed approximately 1-2 cc of purulence.  Periwound erythema extending just proximal to ankle. VASCULAR:  L DP pulse palpable.  L PT pulse palpable.  Edema LLE. MUSCULOSKELETAL:  Calves soft, NT.  LAB/TEST RESULTS   Recent  Labs    02/19/22 1243 02/19/22 1257 02/20/22 0508  WBC 11.3*  --  13.4*  HGB 12.3* 12.6* 10.4*  HCT 37.5* 37.0* 32.4*  PLT 259  --  250  NA 143 143 142  K 3.6 3.5 3.4*  CL 117* 114* 116*  CO2 18*  --  18*  BUN 22* 21* 19  CREATININE 1.20 1.20 1.05  GLUCOSE 297* 287* 117*  CALCIUM 8.9  --  8.0*    Recent Results (from the past 240 hour(s))  Resp Panel by RT-PCR (Flu A&B, Covid) Nasopharyngeal Swab     Status: None   Collection Time: 02/19/22 12:34 PM   Specimen: Nasopharyngeal Swab; Nasopharyngeal(NP) swabs in vial transport medium  Result Value Ref Range Status   SARS Coronavirus 2 by RT PCR NEGATIVE NEGATIVE Final    Comment: (NOTE) SARS-CoV-2 target nucleic acids are NOT DETECTED.  The SARS-CoV-2 RNA is generally detectable in upper respiratory specimens during the acute phase of infection. The  lowest concentration of SARS-CoV-2 viral copies this assay can detect is 138 copies/mL. A negative result does not preclude SARS-Cov-2 infection and should not be used as the sole basis for treatment or other patient management decisions. A negative result may occur with  improper specimen collection/handling, submission of specimen other than nasopharyngeal swab, presence of viral mutation(s) within the areas targeted by this assay, and inadequate number of viral copies(<138 copies/mL). A negative result must be combined with clinical observations, patient history, and epidemiological information. The expected result is Negative.  Fact Sheet for Patients:  BloggerCourse.com  Fact Sheet for Healthcare Providers:  SeriousBroker.it  This test is no t yet approved or cleared by the [place] FDA and  has been authorized for detection and/or diagnosis of SARS-CoV-2 by FDA under an Emergency Use Authorization (EUA). This EUA will remain  in effect (meaning this test can be used) for the duration of the COVID-19 declaration under Section 564(b)(1) of the Act, 21 U.S.C.section 360bbb-3(b)(1), unless the authorization is terminated  or revoked sooner.       Influenza A by PCR NEGATIVE NEGATIVE Final   Influenza B by PCR NEGATIVE NEGATIVE Final    Comment: (NOTE) The Xpert Xpress SARS-CoV-2/FLU/RSV plus assay is intended as an aid in the diagnosis of influenza from Nasopharyngeal swab specimens and should not be used as a sole basis for treatment. Nasal washings and aspirates are unacceptable for Xpert Xpress SARS-CoV-2/FLU/RSV testing.  Fact Sheet for Patients: BloggerCourse.com  Fact Sheet for Healthcare Providers: SeriousBroker.it  This test is not yet approved or cleared by the [place] FDA and has been authorized for detection and/or diagnosis of SARS-CoV-2 by FDA under  an Emergency Use Authorization (EUA). This EUA will remain in effect (meaning this test can be used) for the duration of the COVID-19 declaration under Section 564(b)(1) of the Act, 21 U.S.C. section 360bbb-3(b)(1), unless the authorization is terminated or revoked.  Performed at [facility], [address], [place], [place] [address]   Culture, blood (Routine x 2)     Status: None (Preliminary result)   Collection Time: 02/19/22 12:45 PM   Specimen: BLOOD  Result Value Ref Range Status   Specimen Description BLOOD RIGHT ANTECUBITAL  Final   Special Requests   Final    BOTTLES DRAWN AEROBIC AND ANAEROBIC Blood Culture adequate volume Performed at [facility], [address] [place], [place] [address]    Culture PENDING  Incomplete   Report Status PENDING  Incomplete  Culture, blood (Routine  x 2)     Status: None (Preliminary result)   Collection Time: 02/19/22 12:45 PM   Specimen: BLOOD  Result Value Ref Range Status   Specimen Description BLOOD LEFT ANTECUBITAL  Final   Special Requests   Final    BOTTLES DRAWN AEROBIC AND ANAEROBIC Blood Culture adequate volume Performed at [facility], [address], [place], [place] [address]    Culture PENDING  Incomplete   Report Status PENDING  Incomplete     US ARTERIAL ABI (SCREENING LOWER EXTREMITY)  Result Date: 02/19/2022 CLINICAL DATA:  Left foot ulcer Former smoker EXAM: NONINVASIVE PHYSIOLOGIC VASCULAR STUDY OF BILATERAL LOWER EXTREMITIES TECHNIQUE: Evaluation of both lower extremities were performed at rest, including calculation of ankle-brachial indices with single level Doppler, pressure and pulse volume recording. COMPARISON:  None. FINDINGS: Right ABI:  1.14 Left ABI:  1.17 Right Lower Extremity:  Normal arterial waveforms at the ankle. Left Lower Extremity:  Normal arterial waveforms at the ankle. 1.0-1.4 Normal IMPRESSION: Normal ABI examination. Electronically Signed   By: Acquanetta Belling M.D.   On: 02/19/2022 15:36   DG  Chest Port 1 View  Result Date: 02/19/2022 CLINICAL DATA:  Suspected sepsis, heel wound EXAM: PORTABLE CHEST 1 VIEW COMPARISON:  07/13/2021 FINDINGS: The heart size and mediastinal contours are within normal limits. Right chest port catheter. Both lungs are clear. The visualized skeletal structures are unremarkable. IMPRESSION: No acute abnormality of the lungs in AP portable projection. Electronically Signed   By: Jearld Lesch M.D.   On: 02/19/2022 13:12   DG Foot Complete Left  Result Date: 02/19/2022 CLINICAL DATA:  Pain. Suspected sepsis. Wound to medial aspect of the left heel. History of diabetes. EXAM: LEFT FOOT - COMPLETE 3+ VIEW COMPARISON:  Left foot radiographs 08/24/2021 FINDINGS: Postsurgical changes are again seen of dorsal plate and screw instrumented first metatarsophalangeal joint arthrodesis. Solid osseous fusion is again seen. No evidence of hardware failure. Otherwise joint spaces are preserved. No dislocation. Normal variant os naviculare. IMPRESSION: No significant change from prior. First metatarsophalangeal joint arthrodesis without hardware failure. Electronically Signed   By: Neita Garnet M.D.   On: 02/19/2022 13:34    ASSESSMENT Wound L heel. Cellulitis, LLE. Diabetes mellitus with peripheral neuropathy.  PLAN Applied MediHoney dressing.  Will change daily. Obtain MRI L ankle with/without contrast.  Evaluate for abscess/osteomyelitis. Continue antibiotics.  Marius Ditch 02/20/2022, 2:57 PM

## 2022-02-20 NOTE — Hospital Course (Addendum)
Per HPI: ?Dennis Yang is a 36 y.o. 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  He describes some mild serosanguineous discharge and was seen by his podiatrist Friday of last week at which point labs were obtained.  Results returned this a.m. with noted leukocytosis for which he was told to come to the ED for initiation of IV antibiotics. ? ?02/20/22: Patient was admitted with open wound of the left heel with infected blister/cellulitis.  He has been started on IV vancomycin and is being followed by podiatry for wound care.  He still maintains some leukocytosis this a.m., but denies any pain. ? ?02/21/22: Patient still has blood cultures pending and remains on vancomycin with ongoing leukocytosis noted.  MRI without any findings of osteomyelitis noted.  Podiatry following. ? ?02/22/22: Blood cultures with no growth in the last 3 days.  Continues on vancomycin with ongoing leukocytosis.  Podiatry following and performing dressing changes.  Wound cultures have been ordered and are pending. ? ?02/23/22:Added Cefepime to vancomycin due to persistently high WBC count. Podiatry following and wound cultures pending. Start insulin pump tonight to help stabilize BG readings. ?

## 2022-02-20 NOTE — Progress Notes (Signed)
?PROGRESS NOTE ? ? ? ?Dennis Yang  YQM:[id number] DOB: 02-Sep-1986 DOA: 02/19/2022 ?PCP: Tacy Learn, FNP ? ? ?Brief Narrative:  ?Per HPI: ?Dennis Yang is a 36 y.o. 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  He describes some mild serosanguineous discharge and was seen by his podiatrist Friday of last week at which point labs were obtained.  Results returned this a.m. with noted leukocytosis for which he was told to come to the ED for initiation of IV antibiotics. ? ?02/20/22: Patient was admitted with open wound of the left heel with infected blister/cellulitis.  He has been started on IV vancomycin and is being followed by podiatry for wound care.  He still maintains some leukocytosis this a.m., but denies any pain.  ? ? ?Assessment & Plan: ?  ?Principal Problem: ?  Cellulitis and abscess of leg ?Active Problems: ?  Bipolar 1 disorder/BiPolar disorder/depression/PTSD/anxiety.-- ?  Type 2 diabetes mellitus without complication (HCC) ?  GERD (gastroesophageal reflux disease) ?  Breakthrough Seizure (HCC) ? ?Assessment and Plan: ?* Cellulitis and abscess of leg ?Continue IV vancomycin ?Monitor blood cultures ?Appreciate ongoing podiatry consultation ?ABI negative ?Monitor CBC ? ?Breakthrough Seizure (HCC) ?Continue Keppra ? ?GERD (gastroesophageal reflux disease) ?PPI ? ?Type 2 diabetes mellitus without complication (HCC) ?SSI ?Continue home medications ? ?Bipolar 1 disorder/BiPolar disorder/depression/PTSD/anxiety.-- ?Continue home medications ? ? ?DVT prophylaxis: Lovenox ?Code Status: Full ?Family Communication: Father at bedside 3/13 ?Disposition Plan:  ?Status is: Inpatient ?Remains inpatient appropriate because: Ongoing  need for IV antibiotics. ? ?Skin Assessment: ? ?I have examined the patient?s skin and I agree with the wound assessment as performed by the wound care RN as outlined below: ? ?Pressure Injury 02/19/22 Heel Left Unstageable - Full thickness tissue loss in which the base of the injury is covered by slough (yellow, tan, gray, green or brown) and/or eschar (tan, brown or black) in the wound bed. (Active)  ?02/19/22 1707  ?Location: Heel  ?Location Orientation: Left  ?Staging: Unstageable - Full thickness tissue loss in which the base of the injury is covered by slough (yellow, tan, gray, green or brown) and/or eschar (tan, brown or black) in the wound bed.  ?Wound Description (Comments):   ?Present on Admission: Yes  ? ? ?Consultants:  ?Podiatry ? ?Procedures:  ?See below ? ?Antimicrobials:  ?Anti-infectives (From admission, onward)  ? ? Start     Dose/Rate Route Frequency Ordered Stop  ? 02/20/22 0200  vancomycin (VANCOREADY) IVPB 1250 mg/250 mL       ? 1,250 mg ?166.7 mL/hr over 90 Minutes Intravenous Every 12 hours 02/19/22 1350    ? 02/19/22 1245  vancomycin (VANCOCIN) IVPB 1000 mg/200 mL premix  Status:  Discontinued       ? 1,000 mg ?200 mL/hr over 60 Minutes Intravenous  Once 02/19/22 1234 02/19/22 1242  ? 02/19/22 1245  levofloxacin (LEVAQUIN) IVPB 750 mg  Status:  Discontinued       ? 750 mg ?100 mL/hr over 90 Minutes Intravenous  Once 02/19/22 1234 02/19/22 1244  ? 02/19/22 1245  vancomycin (VANCOREADY) IVPB 2000 mg/400 mL       ? 2,000 mg ?200 mL/hr over 120 Minutes Intravenous  Once 02/19/22 1242 02/19/22 1505  ? ?  ? ? ?Subjective: ?Patient seen and evaluated today  with no new acute complaints or concerns. No acute concerns or events noted overnight. ? ?Objective: ?Vitals:  ? 02/19/22 2358 02/20/22 0322 02/20/22 0758 02/20/22 0839  ?BP: 120/73 119/86  123/83  ?Pulse: 66 61  65  ?Resp: 15 16  16   ?Temp: 97.8 ?F (36.6 ?C) 97.6 ?F (36.4 ?C)  97.8 ?F (36.6 ?C)  ?TempSrc:      ?SpO2: 98% 100% 99% 99%  ?Weight:       ?Height:      ? ? ?Intake/Output Summary (Last 24 hours) at 02/20/2022 1143 ?Last data filed at 02/20/2022 0300 ?Gross per 24 hour  ?Intake 2528.35 ml  ?Output 1000 ml  ?Net 1528.35 ml  ? ?Filed Weights  ? 02/19/22 1225 02/19/22 1540  ?Weight: 84.9 kg 86.8 kg  ? ? ?Examination: ? ?General exam: Appears calm and comfortable  ?Respiratory system: Clear to auscultation. Respiratory effort normal. ?Cardiovascular system: S1 & S2 heard, RRR.  ?Gastrointestinal system: Abdomen is soft ?Central nervous system: Alert and awake ?Extremities: Left heel and dressings clean dry and intact ?Skin: No significant lesions noted ?Psychiatry: Flat affect. ? ? ? ?Data Reviewed: I have personally reviewed following labs and imaging studies ? ?CBC: ?Recent Labs  ?Lab 02/19/22 ?1243 02/19/22 ?1257 02/20/22 ?02/22/22  ?WBC 11.3*  --  13.4*  ?NEUTROABS 6.2  --   --   ?HGB 12.3* 12.6* 10.4*  ?HCT 37.5* 37.0* 32.4*  ?MCV 94.9  --  93.9  ?PLT 259  --  250  ? ?Basic Metabolic Panel: ?Recent Labs  ?Lab 02/19/22 ?1243 02/19/22 ?1257 02/20/22 ?02/22/22  ?NA 143 143 142  ?K 3.6 3.5 3.4*  ?CL 117* 114* 116*  ?CO2 18*  --  18*  ?GLUCOSE 297* 287* 117*  ?BUN 22* 21* 19  ?CREATININE 1.20 1.20 1.05  ?CALCIUM 8.9  --  8.0*  ?MG  --   --  1.8  ? ?GFR: ?Estimated Creatinine Clearance: 109.8 mL/min (by C-G formula based on SCr of 1.05 mg/dL). ?Liver Function Tests: ?Recent Labs  ?Lab 02/19/22 ?1243  ?AST 54*  ?ALT 34  ?ALKPHOS 131*  ?BILITOT 0.4  ?PROT 6.7  ?ALBUMIN 3.7  ? ?No results for input(s): LIPASE, AMYLASE in the last 168 hours. ?No results for input(s): AMMONIA in the last 168 hours. ?Coagulation Profile: ?Recent Labs  ?Lab 02/19/22 ?1243  ?INR 1.0  ? ?Cardiac Enzymes: ?No results for input(s): CKTOTAL, CKMB, CKMBINDEX, TROPONINI in the last 168 hours. ?BNP (last 3 results) ?No results for input(s): PROBNP in the last 8760 hours. ?HbA1C: ?No results for input(s): HGBA1C in the last 72 hours. ?CBG: ?Recent Labs  ?Lab 02/19/22 ?1621 02/19/22 ?2108  02/20/22 ?0740 02/20/22 ?1140  ?GLUCAP 292* 104* 140* 161*  ? ?Lipid Profile: ?No results for input(s): CHOL, HDL, LDLCALC, TRIG, CHOLHDL, LDLDIRECT in the last 72 hours. ?Thyroid Function Tests: ?No results for input(s): TSH, T4TOTAL, FREET4, T3FREE, THYROIDAB in the last 72 hours. ?Anemia Panel: ?No results for input(s): VITAMINB12, FOLATE, FERRITIN, TIBC, IRON, RETICCTPCT in the last 72 hours. ?Sepsis Labs: ?Recent Labs  ?Lab 02/19/22 ?1244  ?LATICACIDVEN 1.7  ? ? ?Recent Results (from the past 240 hour(s))  ?Resp Panel by RT-PCR (Flu A&B, Covid) Nasopharyngeal Swab     Status: None  ? Collection Time: 02/19/22 12:34 PM  ? Specimen: Nasopharyngeal Swab; Nasopharyngeal(NP) swabs in vial transport medium  ?Result Value Ref Range Status  ? SARS Coronavirus 2 by RT PCR NEGATIVE NEGATIVE Final  ?  Comment: (NOTE) ?SARS-CoV-2 target nucleic acids are NOT  DETECTED. ? ?The SARS-CoV-2 RNA is generally detectable in upper respiratory ?specimens during the acute phase of infection. The lowest ?concentration of SARS-CoV-2 viral copies this assay can detect is ?138 copies/mL. A negative result does not preclude SARS-Cov-2 ?infection and should not be used as the sole basis for treatment or ?other patient management decisions. A negative result may occur with  ?improper specimen collection/handling, submission of specimen other ?than nasopharyngeal swab, presence of viral mutation(s) within the ?areas targeted by this assay, and inadequate number of viral ?copies(<138 copies/mL). A negative result must be combined with ?clinical observations, patient history, and epidemiological ?information. The expected result is Negative. ? ?Fact Sheet for Patients:  ?BloggerCourse.comhttps://www.fda.gov/media/152166/download ? ?Fact Sheet for Healthcare Providers:  ?SeriousBroker.ithttps://www.fda.gov/media/152162/download ? ?This test is no t yet approved or cleared by the [place] FDA and  ?has been authorized for detection and/or diagnosis of SARS-CoV-2 by ?FDA  under an Emergency Use Authorization (EUA). This EUA will remain  ?in effect (meaning this test can be used) for the duration of the ?COVID-19 declaration under Section 564(b)(1) of the Act, 21 ?U.S.C.section

## 2022-02-20 NOTE — Consult Note (Signed)
WOC Nurse wound consult note ?Consultation was completed by review of records, images and assistance from the bedside nurse/clinical staff.  ? ?Reason for Consult:heel ulcer ?Patient has been followed by podiatrist outpatient and has been seen inpatient by same. See note ?Wound type: neuropathic ?Pressure Injury POA: NA ?Measurement: see podiatry notes ?Wound bed: see podiatry notes ?Drainage (amount, consistency, odor) minimal  ?Periwound: intact, edema  ?Dressing procedure/placement/frequency:  ?Updated orders from Dr. Eliane Decree notes ? ?Re consult if needed, will not follow at this time. ?Thanks ? Dennis Yang [facility] MSN, RN,CWOCN, CNS, CWON-AP [phone])  ?

## 2022-02-21 ENCOUNTER — Encounter (HOSPITAL_COMMUNITY): Payer: Self-pay | Admitting: Internal Medicine

## 2022-02-21 DIAGNOSIS — L03119 Cellulitis of unspecified part of limb: Secondary | ICD-10-CM | POA: Diagnosis not present

## 2022-02-21 DIAGNOSIS — L02419 Cutaneous abscess of limb, unspecified: Secondary | ICD-10-CM | POA: Diagnosis not present

## 2022-02-21 LAB — URINE CULTURE: Culture: NO GROWTH

## 2022-02-21 LAB — BASIC METABOLIC PANEL
Anion gap: 9 (ref 5–15)
BUN: 13 mg/dL (ref 6–20)
CO2: 19 mmol/L — ABNORMAL LOW (ref 22–32)
Calcium: 8.4 mg/dL — ABNORMAL LOW (ref 8.9–10.3)
Chloride: 112 mmol/L — ABNORMAL HIGH (ref 98–111)
Creatinine, Ser: 1.1 mg/dL (ref 0.61–1.24)
GFR, Estimated: >60 mL/min (ref >60)
Glucose, Bld: 283 mg/dL — ABNORMAL HIGH (ref 70–99)
Potassium: 3.7 mmol/L (ref 3.5–5.1)
Sodium: 140 mmol/L (ref 135–145)

## 2022-02-21 LAB — GLUCOSE, CAPILLARY
Glucose-Capillary: 159 mg/dL — ABNORMAL HIGH (ref 70–99)
Glucose-Capillary: 243 mg/dL — ABNORMAL HIGH (ref 70–99)
Glucose-Capillary: 262 mg/dL — ABNORMAL HIGH (ref 70–99)
Glucose-Capillary: 199 mg/dL — ABNORMAL HIGH (ref 70–99)
Glucose-Capillary: 150 mg/dL — ABNORMAL HIGH (ref 70–99)

## 2022-02-21 LAB — MAGNESIUM: Magnesium: 2 mg/dL (ref 1.7–2.4)

## 2022-02-21 LAB — CBC
HCT: 34.1 % — ABNORMAL LOW (ref 39.0–52.0)
Hemoglobin: 10.8 g/dL — ABNORMAL LOW (ref 13.0–17.0)
MCH: 30.1 pg (ref 26.0–34.0)
MCHC: 31.7 g/dL (ref 30.0–36.0)
MCV: 95 fL (ref 80.0–100.0)
Platelets: 282 10*3/uL (ref 150–400)
RBC: 3.59 MIL/uL — ABNORMAL LOW (ref 4.22–5.81)
RDW: 14.5 % (ref 11.5–15.5)
WBC: 13.4 10*3/uL — ABNORMAL HIGH (ref 4.0–10.5)
nRBC: 0 % (ref 0.0–0.2)

## 2022-02-21 MED ORDER — VIBEGRON 75 MG PO TABS
75.0000 mg | ORAL_TABLET | Freq: Every day | ORAL | Status: DC
  Filled 2022-02-21 (×3): qty 1

## 2022-02-21 MED ORDER — TOPIRAMATE 100 MG PO TABS
100.0000 mg | ORAL_TABLET | Freq: Two times a day (BID) | ORAL | Status: DC
  Administered 2022-02-22 – 2022-02-24 (×5): 100 mg via ORAL
  Filled 2022-02-21 (×5): qty 1

## 2022-02-21 MED ORDER — OXYCODONE HCL 5 MG PO TABS
5.0000 mg | ORAL_TABLET | Freq: Four times a day (QID) | ORAL | Status: DC | PRN
  Administered 2022-02-21 – 2022-02-24 (×7): 5 mg via ORAL
  Filled 2022-02-21 (×7): qty 1

## 2022-02-21 MED ORDER — INSULIN GLARGINE-YFGN 100 UNIT/ML ~~LOC~~ SOLN
45.0000 [IU] | Freq: Every day | SUBCUTANEOUS | Status: DC
Start: 2022-02-21 — End: 2022-02-24
  Administered 2022-02-21 – 2022-02-22 (×2): 45 [IU] via SUBCUTANEOUS
  Filled 2022-02-21 (×4): qty 0.45

## 2022-02-21 MED ORDER — INSULIN ASPART 100 UNIT/ML IJ SOLN
8.0000 [IU] | Freq: Three times a day (TID) | INTRAMUSCULAR | Status: DC
  Administered 2022-02-21 – 2022-02-23 (×5): 8 [IU] via SUBCUTANEOUS

## 2022-02-21 NOTE — Progress Notes (Signed)
?PROGRESS NOTE ? ? ? ?Dennis HeysJames Hoh  WUJ:[id number] DOB: 01-26-1986 DOA: 02/19/2022 ?PCP: Tacy LearnGrabowski, Kristen, FNP ? ? ?Brief Narrative:  ?Per HPI: ?Dennis Yang is a 36 y.o. 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  He describes some mild serosanguineous discharge and was seen by his podiatrist Friday of last week at which point labs were obtained.  Results returned this a.m. with noted leukocytosis for which he was told to come to the ED for initiation of IV antibiotics. ? ?02/20/22: Patient was admitted with open wound of the left heel with infected blister/cellulitis.  He has been started on IV vancomycin and is being followed by podiatry for wound care.  He still maintains some leukocytosis this a.m., but denies any pain. ? ?02/21/22: Patient still has blood cultures pending and remains on vancomycin with ongoing leukocytosis noted.  MRI without any findings of osteomyelitis noted.  Podiatry following.  ? ? ?Assessment & Plan: ?  ?Principal Problem: ?  Cellulitis and abscess of leg ?Active Problems: ?  Bipolar 1 disorder/BiPolar disorder/depression/PTSD/anxiety.-- ?  Type 2 diabetes mellitus without complication (HCC) ?  GERD (gastroesophageal reflux disease) ?  Breakthrough Seizure (HCC) ? ?Assessment and Plan: ?* Cellulitis and abscess of leg ?Continue IV vancomycin ?Monitor blood cultures ?Appreciate ongoing podiatry consultation ?ABI negative ?Monitor CBC ?MRI negative for any findings of osteomyelitis ? ?Breakthrough Seizure (HCC) ?Continue Keppra ? ?GERD (gastroesophageal reflux disease) ?PPI ? ?Type 2 diabetes mellitus without complication (HCC) ?SSI ?Continue home medications ? ?Bipolar 1 disorder/BiPolar  disorder/depression/PTSD/anxiety.-- ?Continue home medications ? ? ?DVT prophylaxis: Lovenox ?Code Status: Full ?Family Communication: Father at bedside 3/13 ?Disposition Plan:  ?Status is: Inpatient ?Remains inpatient appropriate because: Ongoing need for IV antibiotics. ?  ?Skin Assessment: ?  ?I have examined the patient?s skin and I agree with the wound assessment as performed by the wound care RN as outlined below: ?  ?Pressure Injury 02/19/22 Heel Left Unstageable - Full thickness tissue loss in which the base of the injury is covered by slough (yellow, tan, gray, green or brown) and/or eschar (tan, brown or black) in the wound bed. (Active)  ?02/19/22 1707  ?Location: Heel  ?Location Orientation: Left  ?Staging: Unstageable - Full thickness tissue loss in which the base of the injury is covered by slough (yellow, tan, gray, green or brown) and/or eschar (tan, brown or black) in the wound bed.  ?Wound Description (Comments):   ?Present on Admission: Yes  ?  ?  ?Consultants:  ?Podiatry ?  ?Procedures:  ?See below ? ? ?Antimicrobials:  ?Anti-infectives (From admission, onward)  ? ? Start     Dose/Rate Route Frequency Ordered Stop  ? 02/20/22 0200  vancomycin (VANCOREADY) IVPB 1250 mg/250 mL       ? 1,250 mg ?166.7 mL/hr over 90 Minutes Intravenous Every 12 hours 02/19/22 1350    ? 02/19/22 1245  vancomycin (VANCOCIN) IVPB 1000 mg/200 mL premix  Status:  Discontinued       ? 1,000 mg ?200 mL/hr over 60 Minutes Intravenous  Once 02/19/22 1234 02/19/22 1242  ? 02/19/22 1245  levofloxacin (LEVAQUIN) IVPB 750 mg  Status:  Discontinued       ? 750 mg ?100 mL/hr over 90 Minutes Intravenous  Once 02/19/22 1234 02/19/22 1244  ? 02/19/22  1245  vancomycin (VANCOREADY) IVPB 2000 mg/400 mL       ? 2,000 mg ?200 mL/hr over 120 Minutes Intravenous  Once 02/19/22 1242 02/19/22 1505  ? ?  ? ? ?Subjective: ?Patient seen and evaluated today with no new acute complaints or concerns. No acute concerns or events noted overnight.  He  complains of his overactive bladder with noted leakage episodes and would like his home medication of Gemtesa started. ? ?Objective: ?Vitals:  ? 02/21/22 0845 02/21/22 1303 02/21/22 1308 02/21/22 1454  ?BP:  (!) 88/60 90/60 95/60   ?Pulse:  (!) 107  98  ?Resp:  18  16  ?Temp:  98 ?F (36.7 ?C)  98.3 ?F (36.8 ?C)  ?TempSrc:  Oral    ?SpO2: 95% 96%  97%  ?Weight:      ?Height:      ? ? ?Intake/Output Summary (Last 24 hours) at 02/21/2022 1458 ?Last data filed at 02/21/2022 1300 ?Gross per 24 hour  ?Intake 360 ml  ?Output 300 ml  ?Net 60 ml  ? ?Filed Weights  ? 02/19/22 1225 02/19/22 1540  ?Weight: 84.9 kg 86.8 kg  ? ? ?Examination: ? ?General exam: Appears calm and comfortable  ?Respiratory system: Clear to auscultation. Respiratory effort normal. ?Cardiovascular system: S1 & S2 heard, RRR.  ?Gastrointestinal system: Abdomen is soft ?Central nervous system: Alert and awake ?Extremities: No edema, left heel in dressings clean dry and intact ?Skin: No significant lesions noted ?Psychiatry: Flat affect. ? ? ? ?Data Reviewed: I have personally reviewed following labs and imaging studies ? ?CBC: ?Recent Labs  ?Lab 02/19/22 ?1243 02/19/22 ?1257 02/20/22 ?02/22/22 02/21/22 ?02/23/22  ?WBC 11.3*  --  13.4* 13.4*  ?NEUTROABS 6.2  --   --   --   ?HGB 12.3* 12.6* 10.4* 10.8*  ?HCT 37.5* 37.0* 32.4* 34.1*  ?MCV 94.9  --  93.9 95.0  ?PLT 259  --  250 282  ? ?Basic Metabolic Panel: ?Recent Labs  ?Lab 02/19/22 ?1243 02/19/22 ?1257 02/20/22 ?02/22/22 02/21/22 ?02/23/22  ?NA 143 143 142 140  ?K 3.6 3.5 3.4* 3.7  ?CL 117* 114* 116* 112*  ?CO2 18*  --  18* 19*  ?GLUCOSE 297* 287* 117* 283*  ?BUN 22* 21* 19 13  ?CREATININE 1.20 1.20 1.05 1.10  ?CALCIUM 8.9  --  8.0* 8.4*  ?MG  --   --  1.8 2.0  ? ?GFR: ?Estimated Creatinine Clearance: 104.8 mL/min (by C-G formula based on SCr of 1.1 mg/dL). ?Liver Function Tests: ?Recent Labs  ?Lab 02/19/22 ?1243  ?AST 54*  ?ALT 34  ?ALKPHOS 131*  ?BILITOT 0.4  ?PROT 6.7  ?ALBUMIN 3.7  ? ?No results for input(s): LIPASE,  AMYLASE in the last 168 hours. ?No results for input(s): AMMONIA in the last 168 hours. ?Coagulation Profile: ?Recent Labs  ?Lab 02/19/22 ?1243  ?INR 1.0  ? ?Cardiac Enzymes: ?No results for input(s): CKTOTAL, CKMB, CKMBINDEX, TROPONINI in the last 168 hours. ?BNP (last 3 results) ?No results for input(s): PROBNP in the last 8760 hours. ?HbA1C: ?Recent Labs  ?  02/19/22 ?1243  ?HGBA1C 9.1*  ? ?CBG: ?Recent Labs  ?Lab 02/20/22 ?1816 02/20/22 ?2132 02/21/22 ?0210 02/21/22 ?02/23/22 02/21/22 ?1105  ?GLUCAP 141* 299* 159* 243* 262*  ? ?Lipid Profile: ?No results for input(s): CHOL, HDL, LDLCALC, TRIG, CHOLHDL, LDLDIRECT in the last 72 hours. ?Thyroid Function Tests: ?No results for input(s): TSH, T4TOTAL, FREET4, T3FREE, THYROIDAB in the last 72 hours. ?Anemia Panel: ?No results for input(s): VITAMINB12, FOLATE, FERRITIN, TIBC, IRON, RETICCTPCT  in the last 72 hours. ?Sepsis Labs: ?Recent Labs  ?Lab 02/19/22 ?1244  ?LATICACIDVEN 1.7  ? ? ?Recent Results (from the past 240 hour(s))  ?Resp Panel by RT-PCR (Flu A&B, Covid) Nasopharyngeal Swab     Status: None  ? Collection Time: 02/19/22 12:34 PM  ? Specimen: Nasopharyngeal Swab; Nasopharyngeal(NP) swabs in vial transport medium  ?Result Value Ref Range Status  ? SARS Coronavirus 2 by RT PCR NEGATIVE NEGATIVE Final  ?  Comment: (NOTE) ?SARS-CoV-2 target nucleic acids are NOT DETECTED. ? ?The SARS-CoV-2 RNA is generally detectable in upper respiratory ?specimens during the acute phase of infection. The lowest ?concentration of SARS-CoV-2 viral copies this assay can detect is ?138 copies/mL. A negative result does not preclude SARS-Cov-2 ?infection and should not be used as the sole basis for treatment or ?other patient management decisions. A negative result may occur with  ?improper specimen collection/handling, submission of specimen other ?than nasopharyngeal swab, presence of viral mutation(s) within the ?areas targeted by this assay, and inadequate number of  viral ?copies(<138 copies/mL). A negative result must be combined with ?clinical observations, patient history, and epidemiological ?information. The expected result is Negative. ? ?Fact Sheet for Patients:  ?https://choi-hooper.net/

## 2022-02-21 NOTE — Progress Notes (Signed)
?   02/21/22 1303  ?Assess: MEWS Score  ?Temp 98 ?F (36.7 ?C)  ?BP (!) 88/60  ?Pulse Rate (!) 107  ?Resp 18  ?SpO2 96 %  ?O2 Device Room Air  ?Assess: MEWS Score  ?MEWS Temp 0  ?MEWS Systolic 1  ?MEWS Pulse 1  ?MEWS RR 0  ?MEWS LOC 0  ?MEWS Score 2  ?MEWS Score Color Yellow  ?Assess: if the MEWS score is Yellow or Red  ?Were vital signs taken at a resting state? Yes  ?Focused Assessment Change from prior assessment (see assessment flowsheet)  ?Early Detection of Sepsis Score *See Row Information* Low  ?MEWS guidelines implemented *See Row Information* Yes  ?Treat  ?Pain Scale 0-10  ?Pain Score 0  ?Take Vital Signs  ?Increase Vital Sign Frequency  Yellow: Q 2hr X 2 then Q 4hr X 2, if remains yellow, continue Q 4hrs  ?Escalate  ?MEWS: Escalate Yellow: discuss with charge nurse/RN and consider discussing with provider and RRT  ?Notify: Charge Nurse/RN  ?Name of Charge Nurse/RN Notified Stanton Kidney RN  ?Date Charge Nurse/RN Notified 02/21/22  ?Time Charge Nurse/RN Notified 1310  ?Notify: Provider  ?Provider Name/Title MD Manuella Ghazi  ?Date Provider Notified 02/21/22  ?Time Provider Notified 1310  ?Notification Type Page ?(Secure chat)  ?Notification Reason Change in status ?(Yellow MEWS)  ?Provider response Other (Comment) ?(New orders given to continue to hold propranolol)  ?Date of Provider Response 02/21/22  ?Time of Provider Response 1316  ? ? ?

## 2022-02-21 NOTE — Progress Notes (Signed)
Patients blood pressure 95/61, pulse 99. Patient has scheduled BP meds, see MAR. MD Manuella Ghazi made aware. New order hold propranolol 40 mg.  ?

## 2022-02-21 NOTE — Progress Notes (Signed)
Inpatient Diabetes Program Recommendations ? ?AACE/ADA: New Consensus Statement on Inpatient Glycemic Control  ? ?Target Ranges:  Prepandial:   less than 140 mg/dL ?     Peak postprandial:   less than 180 mg/dL (1-2 hours) ?     Critically ill patients:  140 - 180 mg/dL  ? ? Latest Reference Range & Units 02/21/22 02:10 02/21/22 07:21  ?Glucose-Capillary 70 - 99 mg/dL 361 (H) 443 (H)  ? ? ?Review of Glycemic Control ? ?Diabetes history: DM1 ?Outpatient Diabetes medications: Omnipod insulin pump with Humalog (2.1 units/hour--provides a total of 50.4 units/day; takes bolus of 20 units with meals); when not using insulin pump takes Lantus 50 units daily, Humalog 20 units with meals ?Current orders for Inpatient glycemic control: Semglee 35 units QHS, Novolog 0-9 units TID with meals, Novolog 0-5 units QHS ? ?Inpatient Diabetes Program Recommendations:   ? ?Insulin: Please consider increasing Semglee to 45 units QHS and adding Novolog 8 units TID with meals for meal coverage if patient eats at least 50% of meals. ? ?NOTE: Spoke with patient over the phone about diabetes and home regimen for diabetes control. Patient reports being followed by [facility] Endocrinology for diabetes management and currently using OmniPod insulin pump with Humalog and Dexcom CGM.  Patient reports that his basal rate is 2.1 units/hour (total of 50.4 units per day) and he takes of bolus of 20 units with meals. Patient is not sure what his correction factor is on his pump or how much one unit of insulin drops his glucose. Patient states that he was told to remove his insulin pump when he was in the ED on 02/19/22. Patient states that he does no currently have Dexcom CGM on either. Patient reports that when he does not use the insulin pump he has Lantus (takes 50 units daily) and Humalog (for meal coverage and correction) to take SQ at home.  Inquired about prior A1C and patient reports last A1C was in 12% range.  Discussed A1C results (9.1% on  02/19/22) and explained that current A1C indicates an average glucose of 214 mg/dl over the past 2-3 months. Patient states that glucose has been averaging in the 200's mg/dl. Discussed glucose and A1C goals. Discussed importance of checking CBGs and maintaining good CBG control to prevent long-term and short-term complications. Explained how hyperglycemia leads to damage within blood vessels which lead to the common complications seen with uncontrolled diabetes. Stressed to the patient the importance of improving glycemic control to prevent further complications from uncontrolled diabetes. Patient states that his next Endocrinologist appointment is not until a couple months. Encouraged patient to call his Endocrinologist office to let them know how glucose is trending so they can give him so advice on making adjustments to his insulin pump to get DM under better control especially given cellulitis and abscess of leg.  Patient verbalized understanding of information discussed and reports no further questions at this time related to diabetes. ? ?Thanks, ?Orlando Penner, RN, MSN, CDE ?Diabetes Coordinator ?Inpatient Diabetes Program ?[phone] (Team Pager) ? ? ? ?

## 2022-02-21 NOTE — Progress Notes (Addendum)
PODIATRY PROGRESS NOTE ? ?SUBJECTIVE ?Dennis Yang is being followed for cellulitis/ulceration of his L heel.  Had MRI yesterday afternoon.  Denies nausea, vomiting, fever or chills.  Father present at bedside. ? ?MEDICATIONS ?Scheduled Meds: ? anagrelide  1 mg Oral BID  ? ARIPiprazole  20 mg Oral QHS  ? cariprazine  3 mg Oral Daily  ? enoxaparin (LOVENOX) injection  40 mg Subcutaneous Q24H  ? fluticasone  1 spray Each Nare Daily  ? hydrOXYzine  25 mg Oral QID  ? insulin aspart  0-5 Units Subcutaneous QHS  ? insulin aspart  0-9 Units Subcutaneous TID WC  ? insulin aspart  8 Units Subcutaneous TID WC  ? insulin glargine-yfgn  45 Units Subcutaneous QHS  ? leptospermum manuka honey  1 application. Topical Daily  ? levETIRAcetam  750 mg Oral BID  ? linaclotide  72 mcg Oral Daily  ? lipase/protease/amylase  12,000 Units Oral With snacks  ? lipase/protease/amylase  24,000 Units Oral TID with meals  ? loratadine  10 mg Oral Daily  ? mometasone-formoterol  2 puff Inhalation BID  ? montelukast  10 mg Oral Daily  ? ondansetron  8 mg Oral TID AC  ? pantoprazole  40 mg Oral Daily  ? prazosin  5 mg Oral QHS  ? pregabalin  200 mg Oral BID  ? propranolol  40 mg Oral BID  ? simvastatin  10 mg Oral q1800  ? sodium chloride flush  3 mL Intravenous Q12H  ? tamsulosin  0.4 mg Oral Daily  ? topiramate  100 mg Oral BID  ? topiramate  200 mg Oral QHS  ? vortioxetine HBr  20 mg Oral Daily  ? ?Continuous Infusions: ? sodium chloride    ? vancomycin 1,250 mg (02/21/22 1439)  ? ?PRN Meds:.sodium chloride, acetaminophen, cyclobenzaprine, ondansetron **OR** ondansetron (ZOFRAN) IV, sodium chloride flush, zolpidem ? ?OBJECTIVE ?Vital signs in last 24 hours:   ?Temp:  [98 ?F (36.7 ?C)-98.3 ?F (36.8 ?C)] 98.3 ?F (36.8 ?C) (03/15 1454) ?Pulse Rate:  [82-107] 98 (03/15 1454) ?Resp:  [16-20] 16 (03/15 1454) ?BP: (88-121)/(59-72) 95/60 (03/15 1454) ?SpO2:  [95 %-97 %] 97 % (03/15 1454) ? ?DRESSING:  Intact, L foot.  No strikethrough. ?INTEGUMENT:   Significant improvement in appearance of ulcer from yesterday.  3.7 x 3.6 cm open wound with 2.6 x 2.0 cm central eschar.  Red, granular tissue surrounding eschar.  No fluctuance.  Erythema improving. ?VASCULAR:  L DP pulse palpable.  L PT pulse palpable.  Edema LLE. ?MUSCULOSKELETAL:  Calves soft, NT. ? ? ?LAB/TEST RESULTS  ?Recent Labs  ?  02/20/22 ?7544 02/21/22 ?9201  ?WBC 13.4* 13.4*  ?HGB 10.4* 10.8*  ?HCT 32.4* 34.1*  ?PLT 250 282  ?NA 142 140  ?K 3.4* 3.7  ?CL 116* 112*  ?CO2 18* 19*  ?BUN 19 13  ?CREATININE 1.05 1.10  ?GLUCOSE 117* 283*  ?CALCIUM 8.0* 8.4*  ? ? ?Recent Results (from the past 240 hour(s))  ?Resp Panel by RT-PCR (Flu A&B, Covid) Nasopharyngeal Swab     Status: None  ? Collection Time: 02/19/22 12:34 PM  ? Specimen: Nasopharyngeal Swab; Nasopharyngeal(NP) swabs in vial transport medium  ?Result Value Ref Range Status  ? SARS Coronavirus 2 by RT PCR NEGATIVE NEGATIVE Final  ?  Comment: (NOTE) ?SARS-CoV-2 target nucleic acids are NOT DETECTED. ? ?The SARS-CoV-2 RNA is generally detectable in upper respiratory ?specimens during the acute phase of infection. The lowest ?concentration of SARS-CoV-2 viral copies this assay can detect is ?138  copies/mL. A negative result does not preclude SARS-Cov-2 ?infection and should not be used as the sole basis for treatment or ?other patient management decisions. A negative result may occur with  ?improper specimen collection/handling, submission of specimen other ?than nasopharyngeal swab, presence of viral mutation(s) within the ?areas targeted by this assay, and inadequate number of viral ?copies(<138 copies/mL). A negative result must be combined with ?clinical observations, patient history, and epidemiological ?information. The expected result is Negative. ? ?Fact Sheet for Patients:  ?BloggerCourse.com ? ?Fact Sheet for Healthcare Providers:  ?SeriousBroker.it ? ?This test is no t yet approved or cleared  by the [place] FDA and  ?has been authorized for detection and/or diagnosis of SARS-CoV-2 by ?FDA under an Emergency Use Authorization (EUA). This EUA will remain  ?in effect (meaning this test can be used) for the duration of the ?COVID-19 declaration under Section 564(b)(1) of the Act, 21 ?U.S.C.section 360bbb-3(b)(1), unless the authorization is terminated  ?or revoked sooner.  ? ? ?  ? Influenza A by PCR NEGATIVE NEGATIVE Final  ? Influenza B by PCR NEGATIVE NEGATIVE Final  ?  Comment: (NOTE) ?The Xpert Xpress SARS-CoV-2/FLU/RSV plus assay is intended as an aid ?in the diagnosis of influenza from Nasopharyngeal swab specimens and ?should not be used as a sole basis for treatment. Nasal washings and ?aspirates are unacceptable for Xpert Xpress SARS-CoV-2/FLU/RSV ?testing. ? ?Fact Sheet for Patients: ?BloggerCourse.com ? ?Fact Sheet for Healthcare Providers: ?SeriousBroker.it ? ?This test is not yet approved or cleared by the [place] FDA and ?has been authorized for detection and/or diagnosis of SARS-CoV-2 by ?FDA under an Emergency Use Authorization (EUA). This EUA will remain ?in effect (meaning this test can be used) for the duration of the ?COVID-19 declaration under Section 564(b)(1) of the Act, 21 U.S.C. ?section 360bbb-3(b)(1), unless the authorization is terminated or ?revoked. ? ?Performed at [facility], [address] [place], [place] [address] ?  ?Urine Culture     Status: None  ? Collection Time: 02/19/22 12:34 PM  ? Specimen: Urine, Catheterized  ?Result Value Ref Range Status  ? Specimen Description   Final  ?  URINE, CATHETERIZED ?Performed at [facility], [address] [place], [place] [address] ?  ? Special Requests   Final  ?  NONE ?Performed at [facility], [address] [place], [place] [address] ?  ? Culture   Final  ?  NO GROWTH ?Performed at [facility], [address] [place], [place] [address] ?  ? Report Status  02/21/2022 FINAL  Final  ?Culture, blood (Routine x 2)     Status: None (Preliminary result)  ? Collection Time: 02/19/22 12:45 PM  ? Specimen: BLOOD  ?Result Value Ref Range Status  ? Specimen Description BLOOD RIGHT ANTECUBITAL  Final  ? Special Requests   Final  ?  BOTTLES DRAWN AEROBIC AND ANAEROBIC Blood Culture adequate volume ?Performed at [facility], [address] [place], [place] [address] ?  ? Culture PENDING  Incomplete  ? Report Status PENDING  Incomplete  ?Culture, blood (Routine x 2)     Status: None (Preliminary result)  ? Collection Time: 02/19/22 12:45 PM  ? Specimen: BLOOD  ?Result Value Ref Range Status  ? Specimen Description BLOOD LEFT ANTECUBITAL  Final  ? Special Requests   Final  ?  BOTTLES DRAWN AEROBIC AND ANAEROBIC Blood Culture adequate volume ?Performed at [facility], [address] [place], [place] [address] ?  ? Culture PENDING  Incomplete  ?  Report Status PENDING  Incomplete  ?  ? ?MR ANKLE LEFT W WO CONTRAST ? ?Result Date: 02/20/2022 ?CLINICAL DATA:  Foot swelling, diabetic, osteomyelitis suspected, xray done EXAM: MRI OF THE LEFT ANKLE WITHOUT AND WITH CONTRAST TECHNIQUE: Multiplanar, multisequence MR imaging of the ankle was performed before and after the administration of intravenous contrast. CONTRAST:  7mL GADAVIST GADOBUTROL 1 MMOL/ML IV SOLN COMPARISON:  Left foot radiograph 01/22/2022 FINDINGS: TENDONS Peroneal: Intact peroneus longus and peroneus brevis tendons. Posteromedial: Mild tenosynovitis of the inframalleolar posterior tibial tendon. The other posteromedial tendons are unremarkable. Anterior: Intact tibialis anterior, extensor hallucis longus and extensor digitorum longus tendons. Achilles: There is peritendinitis of the distal Achilles tendon with mild retrocalcaneal bursitis and mild retro-Achilles edema. Plantar Fascia: Intact. LIGAMENTS Lateral: Anterior talofibular ligament intact. Calcaneofibular ligament intact. Posterior talofibular ligament intact.  Chronic anterior tibiofibular ligament sprain. Intact posterior tibiofibular ligament. Medial: Deltoid ligament intact. Spring ligament intact. CARTILAGE Ankle Joint: No joint effusion. Normal ankle mortise. No c

## 2022-02-22 DIAGNOSIS — L02419 Cutaneous abscess of limb, unspecified: Secondary | ICD-10-CM | POA: Diagnosis not present

## 2022-02-22 DIAGNOSIS — L03119 Cellulitis of unspecified part of limb: Secondary | ICD-10-CM | POA: Diagnosis not present

## 2022-02-22 LAB — CBC
HCT: 35.3 % — ABNORMAL LOW (ref 39.0–52.0)
Hemoglobin: 11.2 g/dL — ABNORMAL LOW (ref 13.0–17.0)
MCH: 29.4 pg (ref 26.0–34.0)
MCHC: 31.7 g/dL (ref 30.0–36.0)
MCV: 92.7 fL (ref 80.0–100.0)
Platelets: 303 10*3/uL (ref 150–400)
RBC: 3.81 MIL/uL — ABNORMAL LOW (ref 4.22–5.81)
RDW: 14.6 % (ref 11.5–15.5)
WBC: 13.5 10*3/uL — ABNORMAL HIGH (ref 4.0–10.5)
nRBC: 0 % (ref 0.0–0.2)

## 2022-02-22 LAB — GLUCOSE, CAPILLARY
Glucose-Capillary: 380 mg/dL — ABNORMAL HIGH (ref 70–99)
Glucose-Capillary: 93 mg/dL (ref 70–99)
Glucose-Capillary: 65 mg/dL — ABNORMAL LOW (ref 70–99)
Glucose-Capillary: 209 mg/dL — ABNORMAL HIGH (ref 70–99)

## 2022-02-22 LAB — VANCOMYCIN, TROUGH: Vancomycin Tr: 18 ug/mL (ref 15–20)

## 2022-02-22 LAB — HEMOGLOBIN A1C
Hgb A1c MFr Bld: 8.8 % — ABNORMAL HIGH (ref 4.8–5.6)
Mean Plasma Glucose: 206 mg/dL

## 2022-02-22 MED ORDER — PANCRELIPASE (LIP-PROT-AMYL) 12000-38000 UNITS PO CPEP
12000.0000 [IU] | ORAL_CAPSULE | ORAL | Status: DC | PRN
  Filled 2022-02-22: qty 1

## 2022-02-22 NOTE — Progress Notes (Signed)
PODIATRY PROGRESS NOTE ? ?SUBJECTIVE ?Some pain yesterday following dressing change yesterday.  Controlled with meds.  Denies nausea, vomiting, fever or chills.  Mother at bedside. ? ?MEDICATIONS ?Scheduled Meds: ? anagrelide  1 mg Oral BID  ? ARIPiprazole  20 mg Oral QHS  ? cariprazine  3 mg Oral Daily  ? enoxaparin (LOVENOX) injection  40 mg Subcutaneous Q24H  ? fluticasone  1 spray Each Nare Daily  ? hydrOXYzine  25 mg Oral QID  ? insulin aspart  0-5 Units Subcutaneous QHS  ? insulin aspart  0-9 Units Subcutaneous TID WC  ? insulin aspart  8 Units Subcutaneous TID WC  ? insulin glargine-yfgn  45 Units Subcutaneous QHS  ? leptospermum manuka honey  1 application. Topical Daily  ? levETIRAcetam  750 mg Oral BID  ? linaclotide  72 mcg Oral Daily  ? lipase/protease/amylase  24,000 Units Oral TID with meals  ? loratadine  10 mg Oral Daily  ? mometasone-formoterol  2 puff Inhalation BID  ? montelukast  10 mg Oral Daily  ? ondansetron  8 mg Oral TID AC  ? pantoprazole  40 mg Oral Daily  ? prazosin  5 mg Oral QHS  ? pregabalin  200 mg Oral BID  ? propranolol  40 mg Oral BID  ? simvastatin  10 mg Oral q1800  ? sodium chloride flush  3 mL Intravenous Q12H  ? tamsulosin  0.4 mg Oral Daily  ? topiramate  100 mg Oral BID  ? topiramate  200 mg Oral QHS  ? Vibegron  75 mg Oral Daily  ? vortioxetine HBr  20 mg Oral Daily  ? ?Continuous Infusions: ? sodium chloride    ? vancomycin 1,250 mg (02/22/22 0223)  ? ?PRN Meds:.sodium chloride, acetaminophen, cyclobenzaprine, lipase/protease/amylase, ondansetron **OR** ondansetron (ZOFRAN) IV, oxyCODONE, sodium chloride flush, zolpidem ? ?OBJECTIVE ?Vital signs in last 24 hours:   ?Temp:  [97.7 ?F (36.5 ?C)-98.3 ?F (36.8 ?C)] 98.3 ?F (36.8 ?C) (03/16 4315) ?Pulse Rate:  [73-107] 73 (03/16 0508) ?Resp:  [16-18] 18 (03/16 0508) ?BP: (88-112)/(60-70) 100/64 (03/16 4008) ?SpO2:  [96 %-99 %] 99 % (03/16 0846) ? ?DRESSING:  Intact, L foot.  No strikethrough. ?INTEGUMENT:  Appearance of the  ulcer is unchanged from yesterday.  Some persistent erythema along the superior aspect of the wound. ?VASCULAR:  L DP pulse palpable.  L PT pulse palpable.  Edema LLE. ?MUSCULOSKELETAL:  Calves soft, NT. ? ?LAB/TEST RESULTS  ?Recent Labs  ?  02/20/22 ?6761 02/21/22 ?9509 02/22/22 ?3267  ?WBC 13.4* 13.4* 13.5*  ?HGB 10.4* 10.8* 11.2*  ?HCT 32.4* 34.1* 35.3*  ?PLT 250 282 303  ?NA 142 140  --   ?K 3.4* 3.7  --   ?CL 116* 112*  --   ?CO2 18* 19*  --   ?BUN 19 13  --   ?CREATININE 1.05 1.10  --   ?GLUCOSE 117* 283*  --   ?CALCIUM 8.0* 8.4*  --   ? ? ?Recent Results (from the past 240 hour(s))  ?Resp Panel by RT-PCR (Flu A&B, Covid) Nasopharyngeal Swab     Status: None  ? Collection Time: 02/19/22 12:34 PM  ? Specimen: Nasopharyngeal Swab; Nasopharyngeal(NP) swabs in vial transport medium  ?Result Value Ref Range Status  ? SARS Coronavirus 2 by RT PCR NEGATIVE NEGATIVE Final  ?  Comment: (NOTE) ?SARS-CoV-2 target nucleic acids are NOT DETECTED. ? ?The SARS-CoV-2 RNA is generally detectable in upper respiratory ?specimens during the acute phase of infection. The lowest ?concentration of  SARS-CoV-2 viral copies this assay can detect is ?138 copies/mL. A negative result does not preclude SARS-Cov-2 ?infection and should not be used as the sole basis for treatment or ?other patient management decisions. A negative result may occur with  ?improper specimen collection/handling, submission of specimen other ?than nasopharyngeal swab, presence of viral mutation(s) within the ?areas targeted by this assay, and inadequate number of viral ?copies(<138 copies/mL). A negative result must be combined with ?clinical observations, patient history, and epidemiological ?information. The expected result is Negative. ? ?Fact Sheet for Patients:  ?BloggerCourse.comhttps://www.fda.gov/media/152166/download ? ?Fact Sheet for Healthcare Providers:  ?SeriousBroker.ithttps://www.fda.gov/media/152162/download ? ?This test is no t yet approved or cleared by the [place] FDA  and  ?has been authorized for detection and/or diagnosis of SARS-CoV-2 by ?FDA under an Emergency Use Authorization (EUA). This EUA will remain  ?in effect (meaning this test can be used) for the duration of the ?COVID-19 declaration under Section 564(b)(1) of the Act, 21 ?U.S.C.section 360bbb-3(b)(1), unless the authorization is terminated  ?or revoked sooner.  ? ? ?  ? Influenza A by PCR NEGATIVE NEGATIVE Final  ? Influenza B by PCR NEGATIVE NEGATIVE Final  ?  Comment: (NOTE) ?The Xpert Xpress SARS-CoV-2/FLU/RSV plus assay is intended as an aid ?in the diagnosis of influenza from Nasopharyngeal swab specimens and ?should not be used as a sole basis for treatment. Nasal washings and ?aspirates are unacceptable for Xpert Xpress SARS-CoV-2/FLU/RSV ?testing. ? ?Fact Sheet for Patients: ?BloggerCourse.comhttps://www.fda.gov/media/152166/download ? ?Fact Sheet for Healthcare Providers: ?SeriousBroker.ithttps://www.fda.gov/media/152162/download ? ?This test is not yet approved or cleared by the [place] FDA and ?has been authorized for detection and/or diagnosis of SARS-CoV-2 by ?FDA under an Emergency Use Authorization (EUA). This EUA will remain ?in effect (meaning this test can be used) for the duration of the ?COVID-19 declaration under Section 564(b)(1) of the Act, 21 U.S.C. ?section 360bbb-3(b)(1), unless the authorization is terminated or ?revoked. ? ?Performed at [facility], [address] [place], [place] [address] ?  ?Urine Culture     Status: None  ? Collection Time: 02/19/22 12:34 PM  ? Specimen: Urine, Catheterized  ?Result Value Ref Range Status  ? Specimen Description   Final  ?  URINE, CATHETERIZED ?Performed at [facility], [address] [place], [place] [address] ?  ? Special Requests   Final  ?  NONE ?Performed at [facility], [address] [place], [place] [address] ?  ? Culture   Final  ?  NO GROWTH ?Performed at [facility], [address] [place], [place] [address] ?  ? Report Status 02/21/2022 FINAL  Final   ?Culture, blood (Routine x 2)     Status: None (Preliminary result)  ? Collection Time: 02/19/22 12:45 PM  ? Specimen: BLOOD  ?Result Value Ref Range Status  ? Specimen Description BLOOD RIGHT ANTECUBITAL  Final  ? Special Requests   Final  ?  BOTTLES DRAWN AEROBIC AND ANAEROBIC Blood Culture adequate volume  ? Culture   Final  ?  NO GROWTH 3 DAYS ?Performed at [facility], [address] [place], [place] [address] ?  ? Report Status PENDING  Incomplete  ?Culture, blood (Routine x 2)     Status: None (Preliminary result)  ? Collection Time: 02/19/22 12:45 PM  ? Specimen: BLOOD  ?Result Value Ref Range Status  ? Specimen Description BLOOD LEFT ANTECUBITAL  Final  ? Special Requests   Final  ?  BOTTLES DRAWN AEROBIC AND ANAEROBIC Blood Culture adequate volume  ? Culture  Final  ?  NO GROWTH 3 DAYS ?Performed at [facility], [address], [place], [place] [address] ?  ? Report Status PENDING  Incomplete  ?  ? ?MR ANKLE LEFT W WO CONTRAST ? ?Result Date: 02/20/2022 ?CLINICAL DATA:  Foot swelling, diabetic, osteomyelitis suspected, xray done EXAM: MRI OF THE LEFT ANKLE WITHOUT AND WITH CONTRAST TECHNIQUE: Multiplanar, multisequence MR imaging of the ankle was performed before and after the administration of intravenous contrast. CONTRAST:  59mL GADAVIST GADOBUTROL 1 MMOL/ML IV SOLN COMPARISON:  Left foot radiograph 01/22/2022 FINDINGS: TENDONS Peroneal: Intact peroneus longus and peroneus brevis tendons. Posteromedial: Mild tenosynovitis of the inframalleolar posterior tibial tendon. The other posteromedial tendons are unremarkable. Anterior: Intact tibialis anterior, extensor hallucis longus and extensor digitorum longus tendons. Achilles: There is peritendinitis of the distal Achilles tendon with mild retrocalcaneal bursitis and mild retro-Achilles edema. Plantar Fascia: Intact. LIGAMENTS Lateral: Anterior talofibular ligament intact. Calcaneofibular ligament intact. Posterior talofibular ligament intact. Chronic  anterior tibiofibular ligament sprain. Intact posterior tibiofibular ligament. Medial: Deltoid ligament intact. Spring ligament intact. CARTILAGE Ankle Joint: No joint effusion. Normal ankle mortise. No chondral

## 2022-02-22 NOTE — Plan of Care (Signed)
  Problem: Education: Goal: Knowledge of General Education information will improve Description Including pain rating scale, medication(s)/side effects and non-pharmacologic comfort measures Outcome: Progressing   Problem: Health Behavior/Discharge Planning: Goal: Ability to manage health-related needs will improve Outcome: Progressing   

## 2022-02-22 NOTE — Progress Notes (Signed)
Patient complain of burning in his foot with dressing change. Pain medication given after dressing change. Patient resting quietly with eyes closed ?

## 2022-02-22 NOTE — Progress Notes (Signed)
Pharmacy Antibiotic Note ? ?Dennis Yang is a 36 y.o. male admitted on 02/19/2022 with cellulitis/infected foot  Pharmacy has been consulted for vancomycin dosing. ?VT 18 ,with true trough around 71mcg/ml. Therapeutic ?MRI negative for abscess or osteomyelitis. ? ?Plan: ?Continue Vancomycin 1250 mg IV every 12 hours. ?Monitor labs, c/s, and vanco level as indicated. ? ?Height: 6' (182.9 cm) ?Weight: 86.8 kg (191 lb 4.8 oz) ?IBW/kg (Calculated) : 77.6 ? ?Temp (24hrs), Avg:97.9 ?F (36.6 ?C), Min:97.6 ?F (36.4 ?C), Max:98.3 ?F (36.8 ?C) ? ?Recent Labs  ?Lab 02/19/22 ?1243 02/19/22 ?[id number] 02/19/22 ?1257 02/20/22 ?[id number] 02/21/22 ?[id number] 02/22/22 ?[id number] 02/22/22 ?1256  ?WBC 11.3*  --   --  13.4* 13.4* 13.5*  --   ?CREATININE 1.20  --  1.20 1.05 1.10  --   --   ?LATICACIDVEN  --  1.7  --   --   --   --   --   ?[place]  --   --   --   --   --   --  18  ? ?  ?Estimated Creatinine Clearance: 104.8 mL/min (by C-G formula based on SCr of 1.1 mg/dL).   ? ?Allergies  ?Allergen Reactions  ? Ciprofloxacin Hives  ? ? ?Antimicrobials this admission: ?Vanco 3/13 >> ? ? ?Microbiology results: ?3/13 BCx: ngtd ?3/13 UCx: no growth  ? ? ?Thank you for allowing pharmacy to be a part of this patient?s care. ? ?Isac Sarna, BS Pharm D, BCPS ?Clinical Pharmacist ?Pager [phone] ?02/22/2022 5:01 PM ? ? ?

## 2022-02-22 NOTE — Progress Notes (Signed)
?PROGRESS NOTE ? ? ? ?Dennis Yang  VIF:[id number] DOB: Feb 01, 1986 DOA: 02/19/2022 ?PCP: Tacy Learn, FNP ? ? ?Brief Narrative:  ?Per HPI: ?Dennis Yang is a 36 y.o. 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  He describes some mild serosanguineous discharge and was seen by his podiatrist Friday of last week at which point labs were obtained.  Results returned this a.m. with noted leukocytosis for which he was told to come to the ED for initiation of IV antibiotics. ? ?02/20/22: Patient was admitted with open wound of the left heel with infected blister/cellulitis.  He has been started on IV vancomycin and is being followed by podiatry for wound care.  He still maintains some leukocytosis this a.m., but denies any pain. ? ?02/21/22: Patient still has blood cultures pending and remains on vancomycin with ongoing leukocytosis noted.  MRI without any findings of osteomyelitis noted.  Podiatry following. ? ?02/22/22: Blood cultures with no growth in the last 3 days.  Continues on vancomycin with ongoing leukocytosis.  Podiatry following and performing dressing changes.  Wound cultures have been ordered and are pending.  ? ? ?Assessment & Plan: ?  ?Principal Problem: ?  Cellulitis and abscess of leg ?Active Problems: ?  Bipolar 1 disorder/BiPolar disorder/depression/PTSD/anxiety.-- ?  Type 2 diabetes mellitus without complication (HCC) ?  GERD (gastroesophageal reflux disease) ?  Breakthrough Seizure (HCC) ? ?Assessment and Plan: ?* Cellulitis and abscess of leg ?Continue IV vancomycin ?Monitor blood cultures ?Appreciate ongoing podiatry consultation ?ABI negative ?Monitor CBC ?MRI negative for any findings of  osteomyelitis ? ?Breakthrough Seizure (HCC) ?Continue Keppra ? ?GERD (gastroesophageal reflux disease) ?PPI ? ?Type 2 diabetes mellitus without complication (HCC) ?SSI ?Continue home medications ? ?Bipolar 1 disorder/BiPolar disorder/depression/PTSD/anxiety.-- ?Continue home medications ? ? ?DVT prophylaxis: Lovenox ?Code Status: Full ?Family Communication: Father at bedside 3/13 ?Disposition Plan:  ?Status is: Inpatient ?Remains inpatient appropriate because: Ongoing need for IV antibiotics. ?  ?Skin Assessment: ?  ?I have examined the patient?s skin and I agree with the wound assessment as performed by the wound care RN as outlined below: ?  ?Pressure Injury 02/19/22 Heel Left Unstageable - Full thickness tissue loss in which the base of the injury is covered by slough (yellow, tan, gray, green or brown) and/or eschar (tan, brown or black) in the wound bed. (Active)  ?02/19/22 1707  ?Location: Heel  ?Location Orientation: Left  ?Staging: Unstageable - Full thickness tissue loss in which the base of the injury is covered by slough (yellow, tan, gray, green or brown) and/or eschar (tan, brown or black) in the wound bed.  ?Wound Description (Comments):   ?Present on Admission: Yes  ?  ?  ?Consultants:  ?Podiatry ?  ?Procedures:  ?See below ? ? ? ?Subjective: ?Patient seen and evaluated today with no new acute complaints or concerns. No acute concerns or events noted overnight. ? ?Objective: ?Vitals:  ? 02/21/22 2232 02/22/22 0508 02/22/22 0846 02/22/22 1327  ?BP: 112/70 100/64  105/64  ?Pulse: 80 73  79  ?Resp: 18 18  18   ?Temp: 97.7 ?F (36.5 ?C) 98.3 ?F (36.8 ?C)  97.6 ?F (36.4 ?C)  ?TempSrc:    Oral  ?SpO2: 96% 98% 99% 96%  ?Weight:      ?Height:      ? ? ?  Intake/Output Summary (Last 24 hours) at 02/22/2022 1555 ?Last data filed at 02/22/2022 0500 ?Gross per 24 hour  ?Intake 240 ml  ?Output --  ?Net 240 ml  ? ?Filed Weights  ? 02/19/22 1225 02/19/22 1540  ?Weight: 84.9 kg 86.8 kg  ? ? ?Examination: ? ?General exam:  Appears calm and comfortable  ?Respiratory system: Clear to auscultation. Respiratory effort normal. ?Cardiovascular system: S1 & S2 heard, RRR.  ?Gastrointestinal system: Abdomen is soft ?Central nervous system: Alert and awake ?Extremities: No edema; left lower extremity dressings clean dry and intact ?Skin: No significant lesions noted ?Psychiatry: Flat affect. ? ? ? ?Data Reviewed: I have personally reviewed following labs and imaging studies ? ?CBC: ?Recent Labs  ?Lab 02/19/22 ?1243 02/19/22 ?1257 02/20/22 ?[id number] 02/21/22 ?[id number] 02/22/22 ?[id number]  ?WBC 11.3*  --  13.4* 13.4* 13.5*  ?NEUTROABS 6.2  --   --   --   --   ?HGB 12.3* 12.6* 10.4* 10.8* 11.2*  ?HCT 37.5* 37.0* 32.4* 34.1* 35.3*  ?MCV 94.9  --  93.9 95.0 92.7  ?PLT 259  --  250 282 303  ? ?Basic Metabolic Panel: ?Recent Labs  ?Lab 02/19/22 ?1243 02/19/22 ?1257 02/20/22 ?[id number] 02/21/22 ?[id number]  ?NA 143 143 142 140  ?K 3.6 3.5 3.4* 3.7  ?CL 117* 114* 116* 112*  ?CO2 18*  --  18* 19*  ?GLUCOSE 297* 287* 117* 283*  ?BUN 22* 21* 19 13  ?CREATININE 1.20 1.20 1.05 1.10  ?CALCIUM 8.9  --  8.0* 8.4*  ?MG  --   --  1.8 2.0  ? ?GFR: ?Estimated Creatinine Clearance: 104.8 mL/min (by C-G formula based on SCr of 1.1 mg/dL). ?Liver Function Tests: ?Recent Labs  ?Lab 02/19/22 ?1243  ?AST 54*  ?ALT 34  ?ALKPHOS 131*  ?BILITOT 0.4  ?PROT 6.7  ?ALBUMIN 3.7  ? ?No results for input(s): LIPASE, AMYLASE in the last 168 hours. ?No results for input(s): AMMONIA in the last 168 hours. ?Coagulation Profile: ?Recent Labs  ?Lab 02/19/22 ?1243  ?INR 1.0  ? ?Cardiac Enzymes: ?No results for input(s): CKTOTAL, CKMB, CKMBINDEX, TROPONINI in the last 168 hours. ?BNP (last 3 results) ?No results for input(s): PROBNP in the last 8760 hours. ?HbA1C: ?Recent Labs  ?  02/20/22 ?[id number]  ?HGBA1C 8.8*  ? ?CBG: ?Recent Labs  ?Lab 02/21/22 ?1105 02/21/22 ?1614 02/21/22 ?2229 02/22/22 ?[id number] 02/22/22 ?1211  ?GLUCAP 262* 199* 150* 380* 93  ? ?Lipid Profile: ?No results for input(s): CHOL, HDL, LDLCALC, TRIG,  CHOLHDL, LDLDIRECT in the last 72 hours. ?Thyroid Function Tests: ?No results for input(s): TSH, T4TOTAL, FREET4, T3FREE, THYROIDAB in the last 72 hours. ?Anemia Panel: ?No results for input(s): VITAMINB12, FOLATE, FERRITIN, TIBC, IRON, RETICCTPCT in the last 72 hours. ?Sepsis Labs: ?Recent Labs  ?Lab 02/19/22 ?1244  ?LATICACIDVEN 1.7  ? ? ?Recent Results (from the past 240 hour(s))  ?Resp Panel by RT-PCR (Flu A&B, Covid) Nasopharyngeal Swab     Status: None  ? Collection Time: 02/19/22 12:34 PM  ? Specimen: Nasopharyngeal Swab; Nasopharyngeal(NP) swabs in vial transport medium  ?Result Value Ref Range Status  ? SARS Coronavirus 2 by RT PCR NEGATIVE NEGATIVE Final  ?  Comment: (NOTE) ?SARS-CoV-2 target nucleic acids are NOT DETECTED. ? ?The SARS-CoV-2 RNA is generally detectable in upper respiratory ?specimens during the acute phase of infection. The lowest ?concentration of SARS-CoV-2 viral copies this assay can detect is ?138 copies/mL. A negative result does not preclude SARS-Cov-2 ?infection and should not be used as the sole basis for  treatment or ?other patient management decisions. A negative result may occur with  ?improper specimen collection/handling, submission of specimen other ?than nasopharyngeal swab, presence of viral mutation(s) within the ?areas targeted by this assay, and inadequate number of viral ?copies(<138 copies/mL). A negative result must be combined with ?clinical observations, patient history, and epidemiological ?information. The expected result is Negative. ? ?Fact Sheet for Patients:  ?BloggerCourse.com ? ?Fact Sheet for Healthcare Providers:  ?SeriousBroker.it ? ?This test is no t yet approved or cleared by the [place] FDA and  ?has been authorized for detection and/or diagnosis of SARS-CoV-2 by ?FDA under an Emergency Use Authorization (EUA). This EUA will remain  ?in effect (meaning this test can be used) for the duration of  the ?COVID-19 declaration under Section 564(b)(1) of the Act, 21 ?U.S.C.section 360bbb-3(b)(1), unless the authorization is terminated  ?or revoked sooner.  ? ? ?  ? Influenza A by PCR NEGATIVE NEGATIVE Final  ? Influenza B by

## 2022-02-23 ENCOUNTER — Encounter (HOSPITAL_COMMUNITY): Payer: Self-pay | Admitting: Internal Medicine

## 2022-02-23 ENCOUNTER — Inpatient Hospital Stay (HOSPITAL_COMMUNITY): Payer: Medicare Other

## 2022-02-23 DIAGNOSIS — L02419 Cutaneous abscess of limb, unspecified: Secondary | ICD-10-CM | POA: Diagnosis not present

## 2022-02-23 DIAGNOSIS — L03119 Cellulitis of unspecified part of limb: Secondary | ICD-10-CM | POA: Diagnosis not present

## 2022-02-23 LAB — GLUCOSE, CAPILLARY
Glucose-Capillary: 221 mg/dL — ABNORMAL HIGH (ref 70–99)
Glucose-Capillary: 215 mg/dL — ABNORMAL HIGH (ref 70–99)
Glucose-Capillary: 81 mg/dL (ref 70–99)
Glucose-Capillary: 252 mg/dL — ABNORMAL HIGH (ref 70–99)

## 2022-02-23 LAB — CBC
HCT: 35.7 % — ABNORMAL LOW (ref 39.0–52.0)
Hemoglobin: 11.6 g/dL — ABNORMAL LOW (ref 13.0–17.0)
MCH: 31 pg (ref 26.0–34.0)
MCHC: 32.5 g/dL (ref 30.0–36.0)
MCV: 95.5 fL (ref 80.0–100.0)
Platelets: 283 10*3/uL (ref 150–400)
RBC: 3.74 MIL/uL — ABNORMAL LOW (ref 4.22–5.81)
RDW: 14.8 % (ref 11.5–15.5)
WBC: 14.5 10*3/uL — ABNORMAL HIGH (ref 4.0–10.5)
nRBC: 0 % (ref 0.0–0.2)

## 2022-02-23 LAB — CREATININE, SERUM
Creatinine, Ser: 1.11 mg/dL (ref 0.61–1.24)
GFR, Estimated: >60 mL/min (ref >60)

## 2022-02-23 IMAGING — DX DG CHEST 1V PORT
1 series · 2 of 2 positions shown · non-contrast
Comparison: [DATE]

CLINICAL DATA: Evaluate Port-A-Cath position

EXAM:
PORTABLE CHEST 1 VIEW

[Series 1: chest ap · 0.14mm/px · 2 of 2 slices shown]
[im 1/2]
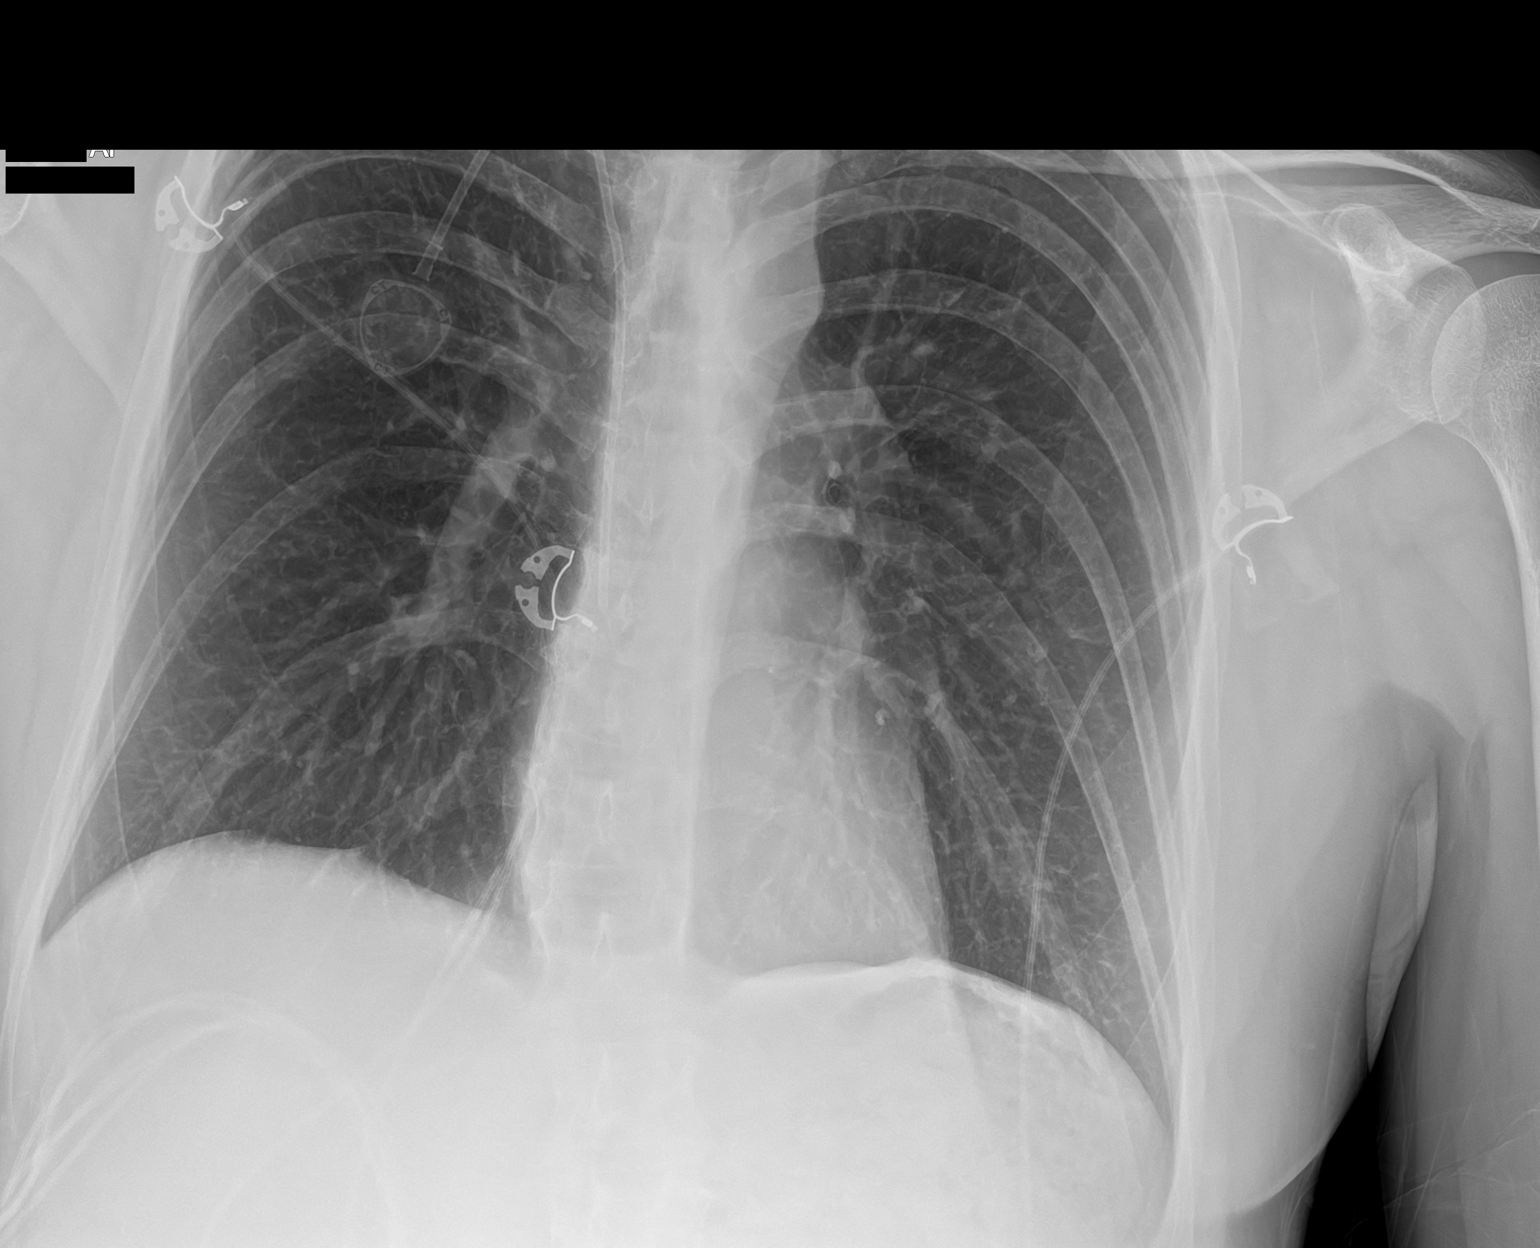
[im 2/2]
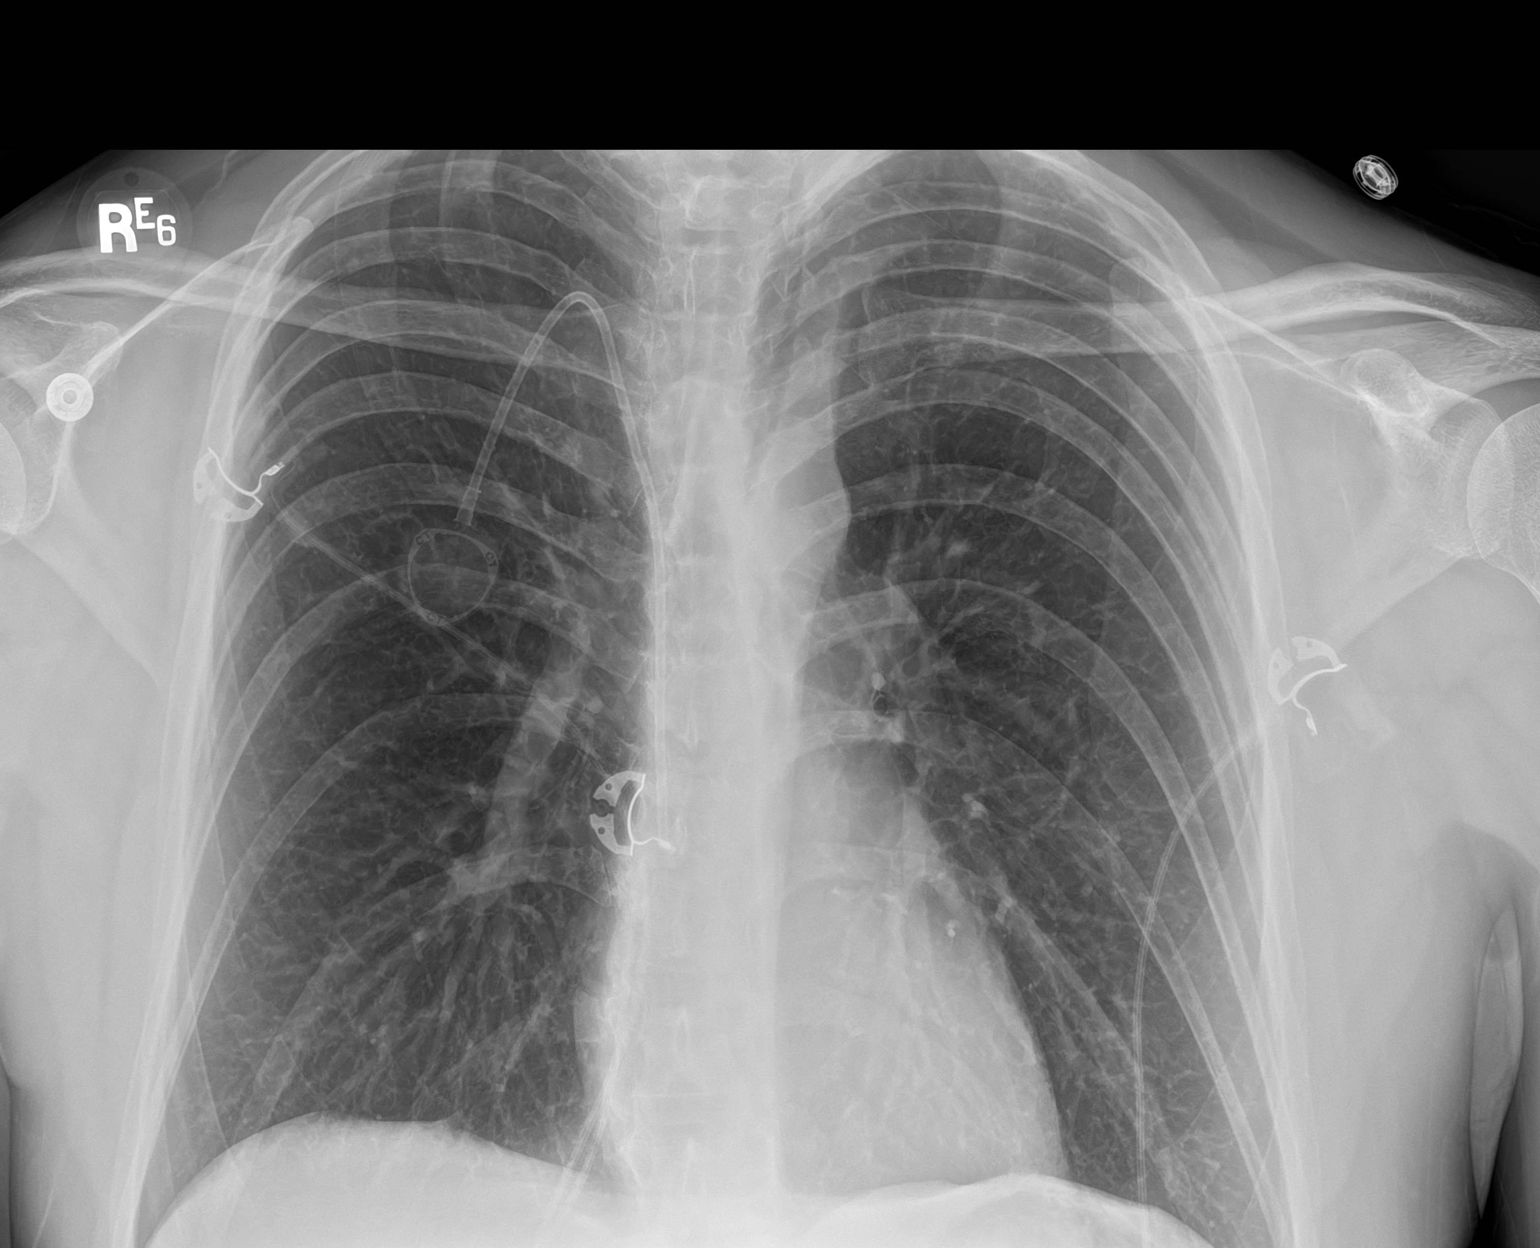

[2 of 2 positions shown; findings below may reference images not displayed]

FINDINGS: Two AP portable radiographs. Right Port-A-Cath tip at superior
caval/atrial junction.

Patient rotated to the left. Midline trachea. Normal heart size. No
pleural effusion or pneumothorax. Mild biapical pleural thickening.
Hyperinflation. Clear lungs.
IMPRESSION: Hyperinflation, without acute disease.

## 2022-02-23 MED ORDER — VIBEGRON 75 MG PO TABS
75.0000 mg | ORAL_TABLET | Freq: Every day | ORAL | Status: DC
  Administered 2022-02-23 – 2022-02-24 (×2): 75 mg via ORAL

## 2022-02-23 MED ORDER — CHLORHEXIDINE GLUCONATE CLOTH 2 % EX PADS
6.0000 | MEDICATED_PAD | Freq: Every day | CUTANEOUS | Status: DC
  Administered 2022-02-23: 6 via TOPICAL

## 2022-02-23 MED ORDER — SODIUM CHLORIDE 0.9 % IV SOLN
2.0000 g | Freq: Three times a day (TID) | INTRAVENOUS | Status: DC
  Administered 2022-02-23 – 2022-02-24 (×3): 2 g via INTRAVENOUS
  Filled 2022-02-23 (×3): qty 2

## 2022-02-23 MED ORDER — INSULIN PUMP
Freq: Three times a day (TID) | SUBCUTANEOUS | Status: DC
  Filled 2022-02-23: qty 1

## 2022-02-23 NOTE — Progress Notes (Signed)
?PROGRESS NOTE ? ? ? ?Dennis Yang  [id number] DOB: 1986/02/15 DOA: 02/19/2022 ?PCP: Beola Cord, FNP ? ? ?Brief Narrative:  ?Per HPI: ?Dennis Yang is a 36 y.o. 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  He describes some mild serosanguineous discharge and was seen by his podiatrist Friday of last week at which point labs were obtained.  Results returned this a.m. with noted leukocytosis for which he was told to come to the ED for initiation of IV antibiotics. ? ?02/20/22: Patient was admitted with open wound of the left heel with infected blister/cellulitis.  He has been started on IV vancomycin and is being followed by podiatry for wound care.  He still maintains some leukocytosis this a.m., but denies any pain. ? ?02/21/22: Patient still has blood cultures pending and remains on vancomycin with ongoing leukocytosis noted.  MRI without any findings of osteomyelitis noted.  Podiatry following. ? ?02/22/22: Blood cultures with no growth in the last 3 days.  Continues on vancomycin with ongoing leukocytosis.  Podiatry following and performing dressing changes.  Wound cultures have been ordered and are pending. ? ?02/23/22:Added Cefepime to vancomycin due to persistently high WBC count. Podiatry following and wound cultures pending. Start insulin pump tonight to help stabilize BG readings.  ? ? ?Assessment & Plan: ?  ?Principal Problem: ?  Cellulitis and abscess of leg ?Active Problems: ?  Bipolar 1 disorder/BiPolar disorder/depression/PTSD/anxiety.-- ?  Type 2 diabetes mellitus without complication (HCC) ?  GERD (gastroesophageal reflux disease) ?  Breakthrough Seizure ([place]) ? ?Assessment and Plan: ? ? ?Cellulitis and abscess  of leg ?Continue IV vancomycin added Cefepime 3/17 ?Monitor blood cultures, neg x 3 days ?Wound cx pending ?Appreciate ongoing podiatry consultation ?ABI negative ?Monitor CBC ?MRI negative for any findings of osteomyelitis ?  ?Breakthrough Seizure ([place]) ?Continue Keppra ?  ?GERD (gastroesophageal reflux disease) ?PPI ?  ?Type 2 diabetes mellitus without complication (HCC) ?SSI ?Continue home medications ?Switch to insulin pump by this evening due to labile readings ?  ?Bipolar 1 disorder/BiPolar disorder/depression/PTSD/anxiety.-- ?Continue home medications ?  ?  ?DVT prophylaxis: Lovenox ?Code Status: Full ?Family Communication: Father at bedside 3/13 ?Disposition Plan:  ?Status is: Inpatient ?Remains inpatient appropriate because: Ongoing need for IV antibiotics. ?  ?Skin Assessment: ?  ?I have examined the patient?s skin and I agree with the wound assessment as performed by the wound care RN as outlined below: ?  ?Pressure Injury 02/19/22 Heel Left Unstageable - Full thickness tissue loss in which the base of the injury is covered by slough (yellow, tan, gray, green or brown) and/or eschar (tan, brown or black) in the wound bed. (Active)  ?02/19/22 1707  ?Location: Heel  ?Location Orientation: Left  ?Staging: Unstageable - Full thickness tissue loss in which the base of the injury is covered by slough (yellow, tan, gray, green or brown) and/or eschar (tan, brown or black) in the wound bed.  ?Wound Description (Comments):   ?Present on Admission: Yes  ?  ?  ?Consultants:  ?Podiatry ?  ?Procedures:  ?See below ? ?Antimicrobials:  ?Anti-infectives (From admission, onward)  ? ? Start     Dose/Rate Route Frequency Ordered Stop  ? 02/20/22 0200  vancomycin (VANCOREADY) IVPB 1250 mg/250 mL       ? 1,250 mg ?  166.7 mL/hr over 90 Minutes Intravenous Every 12 hours 02/19/22 1350    ? 02/19/22 1245  vancomycin (VANCOCIN) IVPB 1000 mg/200 mL premix  Status:  Discontinued       ? 1,000 mg ?200 mL/hr over 60 Minutes  Intravenous  Once 02/19/22 1234 02/19/22 1242  ? 02/19/22 1245  levofloxacin (LEVAQUIN) IVPB 750 mg  Status:  Discontinued       ? 750 mg ?100 mL/hr over 90 Minutes Intravenous  Once 02/19/22 1234 02/19/22 1244  ? 02/19/22 1245  vancomycin (VANCOREADY) IVPB 2000 mg/400 mL       ? 2,000 mg ?200 mL/hr over 120 Minutes Intravenous  Once 02/19/22 1242 02/19/22 1505  ? ?  ? ? ?Subjective: ?Patient seen and evaluated today with no new acute complaints or concerns. No acute concerns or events noted overnight. ? ?Objective: ?Vitals:  ? 02/22/22 2050 02/23/22 0344 02/23/22 0826 02/23/22 0836  ?BP: 105/71 (!) 95/58 107/69   ?Pulse: 85 82 83   ?Resp: 19 18    ?Temp: 98.3 ?F (36.8 ?C) 98.6 ?F (37 ?C) 97.7 ?F (36.5 ?C)   ?TempSrc:   Oral   ?SpO2: 100% 96% 98% 99%  ?Weight:      ?Height:      ? ? ?Intake/Output Summary (Last 24 hours) at 02/23/2022 1210 ?Last data filed at 02/23/2022 0500 ?Gross per 24 hour  ?Intake 2220 ml  ?Output --  ?Net 2220 ml  ? ?Filed Weights  ? 02/19/22 1225 02/19/22 1540  ?Weight: 84.9 kg 86.8 kg  ? ? ?Examination: ? ?General exam: Appears calm and comfortable  ?Respiratory system: Clear to auscultation. Respiratory effort normal. ?Cardiovascular system: S1 & S2 heard, RRR.  ?Gastrointestinal system: Abdomen is soft ?Central nervous system: Alert and awake ?Extremities: No edema; L foot in dressings c/d/i ?Skin: No significant lesions noted ?Psychiatry: Flat affect. ? ? ? ?Data Reviewed: I have personally reviewed following labs and imaging studies ? ?CBC: ?Recent Labs  ?Lab 02/19/22 ?1243 02/19/22 ?1257 02/20/22 ?[id number] 02/21/22 ?[id number] 02/22/22 ?[id number] 02/23/22 ?[id number]  ?WBC 11.3*  --  13.4* 13.4* 13.5* 14.5*  ?NEUTROABS 6.2  --   --   --   --   --   ?HGB 12.3* 12.6* 10.4* 10.8* 11.2* 11.6*  ?HCT 37.5* 37.0* 32.4* 34.1* 35.3* 35.7*  ?MCV 94.9  --  93.9 95.0 92.7 95.5  ?PLT 259  --  250 282 303 283  ? ?Basic Metabolic Panel: ?Recent Labs  ?Lab 02/19/22 ?1243 02/19/22 ?1257 02/20/22 ?[id number] 02/21/22 ?[id number]  02/23/22 ?[id number]  ?NA 143 143 142 140  --   ?K 3.6 3.5 3.4* 3.7  --   ?CL 117* 114* 116* 112*  --   ?CO2 18*  --  18* 19*  --   ?GLUCOSE 297* 287* 117* 283*  --   ?BUN 22* 21* 19 13  --   ?CREATININE 1.20 1.20 1.05 1.10 1.11  ?CALCIUM 8.9  --  8.0* 8.4*  --   ?MG  --   --  1.8 2.0  --   ? ?GFR: ?Estimated Creatinine Clearance: 103.9 mL/min (by C-G formula based on SCr of 1.11 mg/dL). ?Liver Function Tests: ?Recent Labs  ?Lab 02/19/22 ?1243  ?AST 54*  ?ALT 34  ?ALKPHOS 131*  ?BILITOT 0.4  ?PROT 6.7  ?ALBUMIN 3.7  ? ?No results for input(s): LIPASE, AMYLASE in the last 168 hours. ?No results for input(s): AMMONIA in the last 168 hours. ?Coagulation Profile: ?Recent Labs  ?Lab 02/19/22 ?1243  ?INR 1.0  ? ?  Cardiac Enzymes: ?No results for input(s): CKTOTAL, CKMB, CKMBINDEX, TROPONINI in the last 168 hours. ?BNP (last 3 results) ?No results for input(s): PROBNP in the last 8760 hours. ?HbA1C: ?No results for input(s): HGBA1C in the last 72 hours. ?CBG: ?Recent Labs  ?Lab 02/22/22 ?1211 02/22/22 ?1632 02/22/22 ?2053 02/23/22 ?[id number] 02/23/22 ?1141  ?GLUCAP 93 65* 209* 221* 215*  ? ?Lipid Profile: ?No results for input(s): CHOL, HDL, LDLCALC, TRIG, CHOLHDL, LDLDIRECT in the last 72 hours. ?Thyroid Function Tests: ?No results for input(s): TSH, T4TOTAL, FREET4, T3FREE, THYROIDAB in the last 72 hours. ?Anemia Panel: ?No results for input(s): VITAMINB12, FOLATE, FERRITIN, TIBC, IRON, RETICCTPCT in the last 72 hours. ?Sepsis Labs: ?Recent Labs  ?Lab 02/19/22 ?1244  ?LATICACIDVEN 1.7  ? ? ?Recent Results (from the past 240 hour(s))  ?Resp Panel by RT-PCR (Flu A&B, Covid) Nasopharyngeal Swab     Status: None  ? Collection Time: 02/19/22 12:34 PM  ? Specimen: Nasopharyngeal Swab; Nasopharyngeal(NP) swabs in vial transport medium  ?Result Value Ref Range Status  ? SARS Coronavirus 2 by RT PCR NEGATIVE NEGATIVE Final  ?  Comment: (NOTE) ?SARS-CoV-2 target nucleic acids are NOT DETECTED. ? ?The SARS-CoV-2 RNA is generally detectable in  upper respiratory ?specimens during the acute phase of infection. The lowest ?concentration of SARS-CoV-2 viral copies this assay can detect is ?138 copies/mL. A negative result does not preclude SARS-Cov-2 ?infection

## 2022-02-23 NOTE — Progress Notes (Signed)
PODIATRY PROGRESS NOTE  SUBJECTIVE Dennis Yang is a 36 year old male being followed for cellulitis of his left foot secondary to a left heel wound.  Dressing changes are being performed daily.  Overall, he states that he feels okay.  He denies any nausea, vomiting, fever or chills.  MEDICATIONS Scheduled Meds:  anagrelide  1 mg Oral BID   ARIPiprazole  20 mg Oral QHS   cariprazine  3 mg Oral Daily   enoxaparin (LOVENOX) injection  40 mg Subcutaneous Q24H   fluticasone  1 spray Each Nare Daily   hydrOXYzine  25 mg Oral QID   insulin aspart  0-5 Units Subcutaneous QHS   insulin aspart  0-9 Units Subcutaneous TID WC   insulin aspart  8 Units Subcutaneous TID WC   insulin glargine-yfgn  45 Units Subcutaneous QHS   insulin pump   Subcutaneous TID WC, HS, 0200   leptospermum manuka honey  1 application. Topical Daily   levETIRAcetam  750 mg Oral BID   linaclotide  72 mcg Oral Daily   lipase/protease/amylase  24,000 Units Oral TID with meals   loratadine  10 mg Oral Daily   mometasone-formoterol  2 puff Inhalation BID   montelukast  10 mg Oral Daily   ondansetron  8 mg Oral TID AC   pantoprazole  40 mg Oral Daily   prazosin  5 mg Oral QHS   pregabalin  200 mg Oral BID   propranolol  40 mg Oral BID   simvastatin  10 mg Oral q1800   sodium chloride flush  3 mL Intravenous Q12H   tamsulosin  0.4 mg Oral Daily   topiramate  100 mg Oral BID   topiramate  200 mg Oral QHS   Vibegron  75 mg Oral Daily   vortioxetine HBr  20 mg Oral Daily   Continuous Infusions:  sodium chloride     vancomycin 1,250 mg (02/23/22 0205)   PRN Meds:.sodium chloride, acetaminophen, cyclobenzaprine, lipase/protease/amylase, ondansetron **OR** ondansetron (ZOFRAN) IV, oxyCODONE, sodium chloride flush, zolpidem  OBJECTIVE Vital signs in last 24 hours:   Temp:  [97.6 F (36.4 C)-98.6 F (37 C)] 97.7 F (36.5 C) (03/17 0826) Pulse Rate:  [79-85] 83 (03/17 0826) Resp:  [18-19] 18 (03/17 0344) BP:  (95-107)/(58-71) 107/69 (03/17 0826) SpO2:  [96 %-100 %] 99 % (03/17 0836)  DRESSING: Intact, left foot.  No strikethrough. INTEGUMENT: Stable ulceration along the plantar medial aspect of the left heel.  The ulceration is unchanged from yesterday.  There is some residual periwound erythema.  This has remained the same for the past 2 days. VASCULAR: Left DP pulses palpable.  Left PT pulses palpable.  There is edema of the left foot/ankle. MUSCULOSKELETAL: Calves are soft and nontender.  LAB/TEST RESULTS  Recent Labs    02/21/22 0509 02/22/22 0520 02/23/22 0535  WBC 13.4* 13.5* 14.5*  HGB 10.8* 11.2* 11.6*  HCT 34.1* 35.3* 35.7*  PLT 282 303 283  NA 140  --   --   K 3.7  --   --   CL 112*  --   --   CO2 19*  --   --   BUN 13  --   --   CREATININE 1.10  --  1.11  GLUCOSE 283*  --   --   CALCIUM 8.4*  --   --     Recent Results (from the past 240 hour(s))  Resp Panel by RT-PCR (Flu A&B, Covid) Nasopharyngeal Swab     Status: None  Collection Time: 02/19/22 12:34 PM   Specimen: Nasopharyngeal Swab; Nasopharyngeal(NP) swabs in vial transport medium  Result Value Ref Range Status   SARS Coronavirus 2 by RT PCR NEGATIVE NEGATIVE Final    Comment: (NOTE) SARS-CoV-2 target nucleic acids are NOT DETECTED.  The SARS-CoV-2 RNA is generally detectable in upper respiratory specimens during the acute phase of infection. The lowest concentration of SARS-CoV-2 viral copies this assay can detect is 138 copies/mL. A negative result does not preclude SARS-Cov-2 infection and should not be used as the sole basis for treatment or other patient management decisions. A negative result may occur with  improper specimen collection/handling, submission of specimen other than nasopharyngeal swab, presence of viral mutation(s) within the areas targeted by this assay, and inadequate number of viral copies(<138 copies/mL). A negative result must be combined with clinical observations, patient  history, and epidemiological information. The expected result is Negative.  Fact Sheet for Patients:  BloggerCourse.com  Fact Sheet for Healthcare Providers:  SeriousBroker.it  This test is no t yet approved or cleared by the [place] FDA and  has been authorized for detection and/or diagnosis of SARS-CoV-2 by FDA under an Emergency Use Authorization (EUA). This EUA will remain  in effect (meaning this test can be used) for the duration of the COVID-19 declaration under Section 564(b)(1) of the Act, 21 U.S.C.section 360bbb-3(b)(1), unless the authorization is terminated  or revoked sooner.       Influenza A by PCR NEGATIVE NEGATIVE Final   Influenza B by PCR NEGATIVE NEGATIVE Final    Comment: (NOTE) The Xpert Xpress SARS-CoV-2/FLU/RSV plus assay is intended as an aid in the diagnosis of influenza from Nasopharyngeal swab specimens and should not be used as a sole basis for treatment. Nasal washings and aspirates are unacceptable for Xpert Xpress SARS-CoV-2/FLU/RSV testing.  Fact Sheet for Patients: BloggerCourse.com  Fact Sheet for Healthcare Providers: SeriousBroker.it  This test is not yet approved or cleared by the [place] FDA and has been authorized for detection and/or diagnosis of SARS-CoV-2 by FDA under an Emergency Use Authorization (EUA). This EUA will remain in effect (meaning this test can be used) for the duration of the COVID-19 declaration under Section 564(b)(1) of the Act, 21 U.S.C. section 360bbb-3(b)(1), unless the authorization is terminated or revoked.  Performed at [facility], [address] [place], [place] [address]   Urine Culture     Status: None   Collection Time: 02/19/22 12:34 PM   Specimen: Urine, Catheterized  Result Value Ref Range Status   Specimen Description   Final    URINE, CATHETERIZED Performed at [facility],  [address], [place], [place] [address]    Special Requests   Final    NONE Performed at [facility], [address], [place], [place] [address]    Culture   Final    NO GROWTH Performed at [facility], [address], [place], [place] [address]    Report Status 02/21/2022 FINAL  Final  Culture, blood (Routine x 2)     Status: None (Preliminary result)   Collection Time: 02/19/22 12:45 PM   Specimen: BLOOD  Result Value Ref Range Status   Specimen Description BLOOD RIGHT ANTECUBITAL  Final   Special Requests   Final    BOTTLES DRAWN AEROBIC AND ANAEROBIC Blood Culture adequate volume   Culture   Final    NO GROWTH 4 DAYS Performed at [facility], [address], [place], [place] [address]    Report  Status PENDING  Incomplete  Culture, blood (Routine x 2)     Status: None (Preliminary result)   Collection Time: 02/19/22 12:45 PM   Specimen: BLOOD  Result Value Ref Range Status   Specimen Description BLOOD LEFT ANTECUBITAL  Final   Special Requests   Final    BOTTLES DRAWN AEROBIC AND ANAEROBIC Blood Culture adequate volume   Culture   Final    NO GROWTH 4 DAYS Performed at [facility], [address] [place], [place] [address]    Report Status PENDING  Incomplete  Aerobic/Anaerobic Culture w Gram Stain (surgical/deep wound)     Status: None (Preliminary result)   Collection Time: 02/22/22  4:45 PM   Specimen: Foot; Wound  Result Value Ref Range Status   Specimen Description   Final    FOOT Performed at [facility], [address] [place], [place] [address]    Special Requests   Final    LEFT Performed at [facility], [address] [place], [place] [address]    Gram Stain NO WBC SEEN NO ORGANISMS SEEN   Final   Culture   Final    CULTURE REINCUBATED FOR BETTER GROWTH Performed at [facility], [address] [place], [place] [address]    Report Status PENDING  Incomplete     No results found.  ASSESSMENT 1.  Cellulitis, left lower extremity. 2.   Ulceration, left heel. 3.  Diabetes mellitus with peripheral neuropathy.  PLAN A Medihoney dressing was applied to the left foot.  I will continue to change daily. Wound culture is pending. Continue IV antibiotics.  Consider adding gram negative/anaerobic coverage given persistent leukocytosis with vancomycin.  Marius Ditch 02/23/2022, 10:01 AM

## 2022-02-23 NOTE — Progress Notes (Addendum)
Pharmacy Antibiotic Note ? ?Dennis Yang is a 36 y.o. male admitted on 02/19/2022 with cellulitis/infected foot  Pharmacy has been consulted for vancomycin dosing. ? ?MRI negative for abscess or osteomyelitis. ? ?Plan: ?Start cefepime 2000 mg IV every 8 hours. ?Continue Vancomycin 1250 mg IV every 12 hours. ?Monitor labs, c/s, and vanco level as indicated. ? ?Height: 6' (182.9 cm) ?Weight: 86.8 kg (191 lb 4.8 oz) ?IBW/kg (Calculated) : 77.6 ? ?Temp (24hrs), Avg:98.1 ?F (36.7 ?C), Min:97.6 ?F (36.4 ?C), Max:98.6 ?F (37 ?C) ? ?Recent Labs  ?Lab 02/19/22 ?1243 02/19/22 ?[id number] 02/19/22 ?1257 02/20/22 ?[id number] 02/21/22 ?[id number] 02/22/22 ?[id number] 02/22/22 ?1256 02/23/22 ?[id number]  ?WBC 11.3*  --   --  13.4* 13.4* 13.5*  --  14.5*  ?CREATININE 1.20  --  1.20 1.05 1.10  --   --  1.11  ?LATICACIDVEN  --  1.7  --   --   --   --   --   --   ?[place]  --   --   --   --   --   --  18  --   ? ?  ?Estimated Creatinine Clearance: 103.9 mL/min (by C-G formula based on SCr of 1.11 mg/dL).   ? ?Allergies  ?Allergen Reactions  ? Ciprofloxacin Hives  ? ? ?Antimicrobials this admission: ?Vanco 3/13 >> ?Cefepime 3/17 >> ? ? ?Microbiology results: ?3/13 BCx: ngtd ?3/13 UCx: no growth  ?3/16 Wound Cx: klebsiella  ? ? ?Thank you for allowing pharmacy to be a part of this patient?s care. ? ?Margot Ables, PharmD ?Clinical Pharmacist ?02/23/2022 12:35 PM ? ? ? ?

## 2022-02-23 NOTE — Progress Notes (Signed)
Inpatient Diabetes Program Recommendations ? ?AACE/ADA: New Consensus Statement on Inpatient Glycemic Control (2015) ? ?Target Ranges:  Prepandial:   less than 140 mg/dL ?     Peak postprandial:   less than 180 mg/dL (1-2 hours) ?     Critically ill patients:  140 - 180 mg/dL  ? ? Latest Reference Range & Units 02/23/22 09:16 02/23/22 11:41  ?Glucose-Capillary 70 - 99 mg/dL 466 (H) 599 (H)  ?(H): Data is abnormally high ? ? ? ? ?Home DM Meds: Insulin Pump (Omni Pod) ?       Omnipod insulin pump with Humalog (2.1 units/hour--provides a total of 50.4 units/day; takes bolus of 20 units with meals); When not using insulin pump takes Lantus 50 units daily, Humalog 20 units with meals ? ? ?Current Orders: Semglee 45 units QHS ?    Novolog Sensitive Correction Scale/ SSI (0-9 units) TID AC + HS ?    Novolog 8 units TID with meals ? ? ? ?Spoke w/ Dr. Sherryll Burger earlier this AM (~10am).  Dr. Sherryll Burger would like for pt to resume his home insulin pump.  Discussed with Dr. Sherryll Burger that pt received 45 units Semglee last night at bedtime and that it would likely be safest for pt to resume his home insulin pump later this evening after dinner so the basal rates on his pump and the Semglee he was given last PM don't overlap and potentially cause Hypoglycemia.  Semglee insulin given last PM should be out of pt's system by 9-10pm tonight.  Recommend pt be given Novolog correction (SSI) and Novolog meal coverage SQ by the RN for Lunch and Dinner today and then have pt resume his home insulin pump around 7pm tonight.  Dr Sherryll Burger in agreement and gave me permission to call pt and instruct him on this and also alert RN to this.  RN on PM shift tonight to d/c all SQ Insulin order for Novolog and Semglee once Insulin pump resumed.  Orders placed per Dr. Margaretmary Eddy permission for all of the above.   ? ?Called pt and reviewed all of the above with pt as well.  Pt calling family to being pump, pump supplies, Humalog insulin.  Pt instructed to resume his home  insulin pump at 7pm tonight after getting [employer] from RN at [employer].  Pt acknowledged info and agreeable to plan.  Alerted RN as well. ? ? ? ?--Will follow patient during hospitalization-- ? ?Ambrose Finland RN, MSN, CDE ?Diabetes Coordinator ?Inpatient Glycemic Control Team ?Team Pager: [phone] (8a-5p) ? ? ? ? ? ?

## 2022-02-23 NOTE — Progress Notes (Signed)
?  Transition of Care (TOC) Screening Note ? ? ?Patient Details  ?Name: Dennis Yang ?Date of Birth: August 03, 1986 ? ? ?Transition of Care (TOC) CM/SW Contact:    ?Abdel Effinger D, LCSW ?Phone Number: ?02/23/2022, 3:51 PM ? ? ? ?Transition of Care Department [facility]) has reviewed patient and no TOC needs have been identified at this time. We will continue to monitor patient advancement through interdisciplinary progression rounds. If new patient transition needs arise, please place a TOC consult. ?  ?

## 2022-02-24 DIAGNOSIS — F319 Bipolar disorder, unspecified: Secondary | ICD-10-CM | POA: Diagnosis not present

## 2022-02-24 DIAGNOSIS — L03119 Cellulitis of unspecified part of limb: Secondary | ICD-10-CM | POA: Diagnosis not present

## 2022-02-24 DIAGNOSIS — E119 Type 2 diabetes mellitus without complications: Secondary | ICD-10-CM

## 2022-02-24 DIAGNOSIS — L02419 Cutaneous abscess of limb, unspecified: Secondary | ICD-10-CM | POA: Diagnosis not present

## 2022-02-24 DIAGNOSIS — Z794 Long term (current) use of insulin: Secondary | ICD-10-CM

## 2022-02-24 LAB — CBC
HCT: 37.6 % — ABNORMAL LOW (ref 39.0–52.0)
Hemoglobin: 12 g/dL — ABNORMAL LOW (ref 13.0–17.0)
MCH: 29.6 pg (ref 26.0–34.0)
MCHC: 31.9 g/dL (ref 30.0–36.0)
MCV: 92.8 fL (ref 80.0–100.0)
Platelets: 262 10*3/uL (ref 150–400)
RBC: 4.05 MIL/uL — ABNORMAL LOW (ref 4.22–5.81)
RDW: 14.6 % (ref 11.5–15.5)
WBC: 14 10*3/uL — ABNORMAL HIGH (ref 4.0–10.5)
nRBC: 0 % (ref 0.0–0.2)

## 2022-02-24 LAB — GLUCOSE, CAPILLARY
Glucose-Capillary: 96 mg/dL (ref 70–99)
Glucose-Capillary: 91 mg/dL (ref 70–99)
Glucose-Capillary: 140 mg/dL — ABNORMAL HIGH (ref 70–99)

## 2022-02-24 LAB — CULTURE, BLOOD (ROUTINE X 2)
Culture: NO GROWTH
Culture: NO GROWTH
Special Requests: ADEQUATE
Special Requests: ADEQUATE

## 2022-02-24 MED ORDER — AMOXICILLIN-POT CLAVULANATE 875-125 MG PO TABS: 1.0000 | ORAL_TABLET | Freq: Two times a day (BID) | ORAL | 0 refills | Status: AC

## 2022-02-24 MED ORDER — HEPARIN SOD (PORK) LOCK FLUSH 100 UNIT/ML IV SOLN
500.0000 [IU] | Freq: Once | INTRAVENOUS | Status: DC
  Filled 2022-02-24: qty 5

## 2022-02-24 MED ORDER — DOXYCYCLINE MONOHYDRATE 100 MG PO TABS: 100.0000 mg | ORAL_TABLET | Freq: Two times a day (BID) | ORAL | 0 refills | Status: AC

## 2022-02-24 NOTE — Progress Notes (Signed)
PODIATRY PROGRESS NOTE ? ?SUBJECTIVE ?Dennis Yang is a 36 year old male being followed for cellulitis of his left foot secondary to a left heel wound.  Dressing changes are being performed daily.  He denies any nausea, vomiting, fever or chills.  Discharge has been placed by hospitalist.  His father is at bedside to take him home.   ? ?MEDICATIONS ?Scheduled Meds: ? anagrelide  1 mg Oral BID  ? ARIPiprazole  20 mg Oral QHS  ? cariprazine  3 mg Oral Daily  ? Chlorhexidine Gluconate Cloth  6 each Topical Daily  ? enoxaparin (LOVENOX) injection  40 mg Subcutaneous Q24H  ? fluticasone  1 spray Each Nare Daily  ? heparin lock flush  500 Units Intravenous Once  ? hydrOXYzine  25 mg Oral QID  ? insulin aspart  0-5 Units Subcutaneous QHS  ? insulin aspart  0-9 Units Subcutaneous TID WC  ? insulin aspart  8 Units Subcutaneous TID WC  ? insulin glargine-yfgn  45 Units Subcutaneous QHS  ? insulin pump   Subcutaneous TID WC, HS, 0200  ? leptospermum manuka honey  1 application. Topical Daily  ? levETIRAcetam  750 mg Oral BID  ? linaclotide  72 mcg Oral Daily  ? lipase/protease/amylase  24,000 Units Oral TID with meals  ? loratadine  10 mg Oral Daily  ? mometasone-formoterol  2 puff Inhalation BID  ? montelukast  10 mg Oral Daily  ? ondansetron  8 mg Oral TID AC  ? pantoprazole  40 mg Oral Daily  ? prazosin  5 mg Oral QHS  ? pregabalin  200 mg Oral BID  ? propranolol  40 mg Oral BID  ? simvastatin  10 mg Oral q1800  ? sodium chloride flush  3 mL Intravenous Q12H  ? tamsulosin  0.4 mg Oral Daily  ? topiramate  100 mg Oral BID  ? topiramate  200 mg Oral QHS  ? Vibegron  75 mg Oral Daily  ? vortioxetine HBr  20 mg Oral Daily  ? ?Continuous Infusions: ? sodium chloride    ? ceFEPime (MAXIPIME) IV 2 g (02/24/22 0981)  ? vancomycin 1,250 mg (02/24/22 0217)  ? ?PRN Meds:.sodium chloride, acetaminophen, cyclobenzaprine, lipase/protease/amylase, ondansetron **OR** ondansetron (ZOFRAN) IV, oxyCODONE, sodium chloride flush,  zolpidem ? ?OBJECTIVE ?Vital signs in last 24 hours:   ?Temp:  [98 ?F (36.7 ?C)-98.9 ?F (37.2 ?C)] 98.9 ?F (37.2 ?C) (03/18 0522) ?Pulse Rate:  [82-87] 87 (03/18 0522) ?Resp:  [16-20] 16 (03/18 0522) ?BP: (96-105)/(63-68) 98/63 (03/18 0522) ?SpO2:  [96 %-100 %] 96 % (03/18 0723) ? ?DRESSING: Intact, left foot.  No strikethrough. ?INTEGUMENT: Stable ulceration along the plantar medial aspect of the left heel.  Erythema has improved from yesterday. ?VASCULAR: Left DP pulses palpable.  Left PT pulses palpable.  There is edema of the left foot/ankle.  His edema has improved from yesterday. ?MUSCULOSKELETAL: Calves are soft and nontender. ? ?LAB/TEST RESULTS  ?Recent Labs  ?  02/23/22 ?1914 02/24/22 ?7829  ?WBC 14.5* 14.0*  ?HGB 11.6* 12.0*  ?HCT 35.7* 37.6*  ?PLT 283 262  ?CREATININE 1.11  --   ? ? ?Recent Results (from the past 240 hour(s))  ?Resp Panel by RT-PCR (Flu A&B, Covid) Nasopharyngeal Swab     Status: None  ? Collection Time: 02/19/22 12:34 PM  ? Specimen: Nasopharyngeal Swab; Nasopharyngeal(NP) swabs in vial transport medium  ?Result Value Ref Range Status  ? SARS Coronavirus 2 by RT PCR NEGATIVE NEGATIVE Final  ?  Comment: (NOTE) ?SARS-CoV-2 target nucleic  acids are NOT DETECTED. ? ?The SARS-CoV-2 RNA is generally detectable in upper respiratory ?specimens during the acute phase of infection. The lowest ?concentration of SARS-CoV-2 viral copies this assay can detect is ?138 copies/mL. A negative result does not preclude SARS-Cov-2 ?infection and should not be used as the sole basis for treatment or ?other patient management decisions. A negative result may occur with  ?improper specimen collection/handling, submission of specimen other ?than nasopharyngeal swab, presence of viral mutation(s) within the ?areas targeted by this assay, and inadequate number of viral ?copies(<138 copies/mL). A negative result must be combined with ?clinical observations, patient history, and epidemiological ?information. The  expected result is Negative. ? ?Fact Sheet for Patients:  ?BloggerCourse.com ? ?Fact Sheet for Healthcare Providers:  ?SeriousBroker.it ? ?This test is no t yet approved or cleared by the [place] FDA and  ?has been authorized for detection and/or diagnosis of SARS-CoV-2 by ?FDA under an Emergency Use Authorization (EUA). This EUA will remain  ?in effect (meaning this test can be used) for the duration of the ?COVID-19 declaration under Section 564(b)(1) of the Act, 21 ?U.S.C.section 360bbb-3(b)(1), unless the authorization is terminated  ?or revoked sooner.  ? ? ?  ? Influenza A by PCR NEGATIVE NEGATIVE Final  ? Influenza B by PCR NEGATIVE NEGATIVE Final  ?  Comment: (NOTE) ?The Xpert Xpress SARS-CoV-2/FLU/RSV plus assay is intended as an aid ?in the diagnosis of influenza from Nasopharyngeal swab specimens and ?should not be used as a sole basis for treatment. Nasal washings and ?aspirates are unacceptable for Xpert Xpress SARS-CoV-2/FLU/RSV ?testing. ? ?Fact Sheet for Patients: ?BloggerCourse.com ? ?Fact Sheet for Healthcare Providers: ?SeriousBroker.it ? ?This test is not yet approved or cleared by the [place] FDA and ?has been authorized for detection and/or diagnosis of SARS-CoV-2 by ?FDA under an Emergency Use Authorization (EUA). This EUA will remain ?in effect (meaning this test can be used) for the duration of the ?COVID-19 declaration under Section 564(b)(1) of the Act, 21 U.S.C. ?section 360bbb-3(b)(1), unless the authorization is terminated or ?revoked. ? ?Performed at [facility], [address], [place], [place] [address] ?  ?Urine Culture     Status: None  ? Collection Time: 02/19/22 12:34 PM  ? Specimen: Urine, Catheterized  ?Result Value Ref Range Status  ? Specimen Description   Final  ?  URINE, CATHETERIZED ?Performed at [facility], [address], [place], [place] [address] ?  ? Special  Requests   Final  ?  NONE ?Performed at [facility], [address], [place], [place] [address] ?  ? Culture   Final  ?  NO GROWTH ?Performed at [facility], [address], [place], [place] [address] ?  ? Report Status 02/21/2022 FINAL  Final  ?Culture, blood (Routine x 2)     Status: None  ? Collection Time: 02/19/22 12:45 PM  ? Specimen: BLOOD  ?Result Value Ref Range Status  ? Specimen Description BLOOD RIGHT ANTECUBITAL  Final  ? Special Requests   Final  ?  BOTTLES DRAWN AEROBIC AND ANAEROBIC Blood Culture adequate volume  ? Culture   Final  ?  NO GROWTH 5 DAYS ?Performed at [facility], [address] [place], [place] [address] ?  ? Report Status 02/24/2022 FINAL  Final  ?Culture, blood (Routine x 2)     Status: None  ? Collection Time: 02/19/22 12:45 PM  ? Specimen: BLOOD  ?Result Value Ref Range Status  ? Specimen Description BLOOD LEFT ANTECUBITAL  Final  ?  Special Requests   Final  ?  BOTTLES DRAWN AEROBIC AND ANAEROBIC Blood Culture adequate volume  ? Culture   Final  ?  NO GROWTH 5 DAYS ?Performed at [facility], [address] [place], [place] [address] ?  ? Report Status 02/24/2022 FINAL  Final  ?Aerobic/Anaerobic Culture w Gram Stain (surgical/deep wound)     Status: None (Preliminary result)  ? Collection Time: 02/22/22  4:45 PM  ? Specimen: Foot; Wound  ?Result Value Ref Range Status  ? Specimen Description   Final  ?  FOOT ?Performed at [facility], [address] [place], [place] [address] ?  ? Special Requests   Final  ?  LEFT ?Performed at [facility], [address] [place], [place] [address] ?  ? Gram Stain NO WBC SEEN ?NO ORGANISMS SEEN ?  Final  ? Culture   Final  ?  RARE KLEBSIELLA PNEUMONIAE ?CULTURE REINCUBATED FOR BETTER GROWTH ?Performed at [facility], [address] [place], [place] [address] ?  ? Report Status PENDING  Incomplete  ? Organism ID, Bacteria KLEBSIELLA PNEUMONIAE  Final  ?    Susceptibility  ? Klebsiella pneumoniae - MIC*  ?  AMPICILLIN >=32 RESISTANT  Resistant   ?  CEFAZOLIN <=4 SENSITIVE Sensitive   ?  CEFEPIME <=0.12 SENSITIVE Sensitive   ?  CEFTAZIDIME <=1 SENSITIVE Sensitive   ?  CEFTRIAXONE <=0.25 SENSITIVE Sensitive   ?  CIPROFLOXACIN <=0.25 SENSITIVE Sensi

## 2022-02-24 NOTE — Progress Notes (Signed)
Patient seen in rounds this AM. No complaints of pain, dressing to wound on foot changed this morning by night shift. Call bell within reach. ?

## 2022-02-24 NOTE — Discharge Summary (Addendum)
?Physician Discharge Summary ?  ?Patient: Dennis Yang MRN: [id number] DOB: [birth year]  ?Admit date:     02/19/2022  ?Discharge date: 02/24/22  ?Discharge Physician: Marrion Coyekui Emran Molzahn  ? ?PCP: Tacy LearnGrabowski, Kristen, FNP  ? ?Recommendations at discharge:  ? ?Follow-up with PCP in 1 week. ?Follow-up with podiatry in 1 week ? ?Discharge Diagnoses: ?Principal Problem: ?  Cellulitis and abscess of leg ?Active Problems: ?  Bipolar 1 disorder/BiPolar disorder/depression/PTSD/anxiety.-- ?  Type 2 diabetes mellitus without complication (HCC) ?  GERD (gastroesophageal reflux disease) ?  Breakthrough Seizure (HCC) ? ?Resolved Problems: ?  * No resolved hospital problems. * ? ?Hospital Course: ?Per HPI: ?Dennis Yang is a 36 y.o. 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  He describes some mild serosanguineous discharge and was seen by his podiatrist Friday of last week at which point labs were obtained.  Results returned this a.m. with noted leukocytosis for which he was told to come to the ED for initiation of IV antibiotics. ? ?02/20/22: Patient was admitted with open wound of the left heel with infected blister/cellulitis.  He has been started on IV vancomycin and is being followed by podiatry for wound care.  He still maintains some leukocytosis this a.m., but denies any pain. ? ?02/21/22: Patient still has blood cultures pending and remains on vancomycin with ongoing leukocytosis noted.  MRI without any findings of osteomyelitis noted.  Podiatry following. ? ?02/22/22: Blood cultures with no growth in the last 3 days.  Continues on vancomycin with ongoing leukocytosis.  Podiatry following and performing dressing changes.  Wound cultures have been  ordered and are pending. ? ?02/23/22:Added Cefepime to vancomycin due to persistently high WBC count. Podiatry following and wound cultures pending. Start insulin pump tonight to help stabilize BG readings. ? ?Assessment and Plan: ?* Cellulitis and abscess of leg ?Patient is treated with cefepime and vancomycin.  Condition much improved, blood culture negative. ?Wound surface culture came back with Klebsiella pneumonia. ?Condition much improved, will continue antibiotics for additional few days with Augmentin and doxycycline. ?Follow-up with PCP and podiatry in 1 week. ? ? ?Breakthrough Seizure (HCC) ?Resume home regimen. ? ?GERD (gastroesophageal reflux disease) ?PPI ? ?Type 2 diabetes mellitus without complication (HCC) ?Resume home regimen ? ?Bipolar 1 disorder/BiPolar disorder/depression/PTSD/anxiety.-- ?Continue home medications ? ? ? ?Discussed with patient, patient is currently on insulin pump, not on Lantus.  We will discontinue insulin glargine from discharge medication. ?  ? ? ?Consultants: None ?Procedures performed: None  ?Disposition: Home ?Diet recommendation:  ?Discharge Diet Orders (From admission, onward)  ? ?  Start     Ordered  ? 02/24/22 0000  Diet - low sodium heart healthy       ? 02/24/22 0957  ? ?  ?  ? ?  ? ?Carb modified diet ?DISCHARGE MEDICATION: ?Allergies as of 02/24/2022   ? ?   Reactions  ? Ciprofloxacin Hives  ? ?  ? ?  ?Medication List  ?  ? ?TAKE these medications   ? ?acetaminophen 325 MG tablet ?Commonly known as: TYLENOL ?Take 2 tablets (650 mg total) by mouth every 6 (six) hours as needed for mild pain, fever or headache (or Fever >/= 101). ?  ?amoxicillin-clavulanate 875-125 MG tablet ?Commonly known as: Augmentin ?Take 1 tablet by mouth  2 (two) times daily for 5 days. ?  ?anagrelide 1 MG capsule ?Commonly known as: AGRYLIN ?Take 1 mg by mouth in the morning and at bedtime. ?  ?ARIPiprazole 20 MG tablet ?Commonly known as: ABILIFY ?Take 20 mg by mouth at bedtime. ?  ?Atrovent  HFA 17 MCG/ACT inhaler ?Generic drug: ipratropium ?Inhale 1 puff into the lungs every 4 (four) hours as needed for wheezing. ?  ?cetirizine 10 MG tablet ?Commonly known as: ZYRTEC ?Take 10 mg by mouth daily. ?  ?chlorhexidine 4 % external liquid ?Commonly known as: HIBICLENS ?Apply 1 application topically daily as needed (for skin irritation as directed). ?  ?Creon 36000 UNITS Cpep capsule ?Generic drug: lipase/protease/amylase ?Take 36,000-72,000 Units by mouth See admin instructions. Take 2 capsules by mouth with meals and 1 capsule with snacks ?  ?cyclobenzaprine 10 MG tablet ?Commonly known as: FLEXERIL ?Take 10 mg by mouth 3 (three) times daily as needed for muscle spasms. ?  ?diclofenac Sodium 1 % Gel ?Commonly known as: VOLTAREN ?Apply 2 g topically daily as needed (for pain). ?  ?doxycycline 100 MG tablet ?Commonly known as: ADOXA ?Take 1 tablet (100 mg total) by mouth 2 (two) times daily for 5 days. ?  ?Emgality 120 MG/ML Soaj ?Generic drug: Galcanezumab-gnlm ?Inject 1 Dose into the muscle every 30 (thirty) days. ?  ?fluticasone 50 MCG/ACT nasal spray ?Commonly known as: FLONASE ?Place 1 spray into both nostrils daily. ?  ?fluticasone-salmeterol 250-50 MCG/ACT Aepb ?Commonly known as: ADVAIR ?Inhale 1 puff into the lungs 2 (two) times daily. ?  ?Gemtesa 75 MG Tabs ?Generic drug: Vibegron ?Take 75 mg by mouth daily. ?  ?HumaLOG KwikPen 100 UNIT/ML KwikPen ?Generic drug: insulin lispro ?Inject 16 Units into the skin 3 (three) times daily before meals. ?  ?hydrOXYzine 25 MG tablet ?Commonly known as: ATARAX ?Take 25 mg by mouth 4 (four) times daily. ?  ?insulin glargine 100 UNIT/ML injection ?Commonly known as: LANTUS ?Inject 50 Units into the skin at bedtime. ?  ?levETIRAcetam 750 MG tablet ?Commonly known as: KEPPRA ?Take 750 mg by mouth in the morning and at bedtime. ?  ?lidocaine 4 % external solution ?Commonly known as: XYLOCAINE ?Apply topically daily as needed for mild pain or moderate pain. ?   ?linaclotide 72 MCG capsule ?Commonly known as: LINZESS ?Take 72 mcg by mouth daily. ?  ?meclizine 25 MG tablet ?Commonly known as: ANTIVERT ?Take 25 mg by mouth every 6 (six) hours as needed for dizziness or nausea. ?  ?meloxicam 15 MG tablet ?Commonly known as: MOBIC ?Take 15 mg by mouth daily. ?  ?metaxalone 800 MG tablet ?Commonly known as: SKELAXIN ?Take 800 mg by mouth every 8 (eight) hours as needed for muscle spasms. ?  ?montelukast 10 MG tablet ?Commonly known as: SINGULAIR ?Take 10 mg by mouth daily. ?  ?nitroGLYCERIN 0.4 MG SL tablet ?Commonly known as: NITROSTAT ?Place 0.4 mg under the tongue every 5 (five) minutes as needed for chest pain. ?  ?omeprazole 40 MG capsule ?Commonly known as: PRILOSEC ?Take 1 capsule (40 mg total) by mouth daily. ?  ?ondansetron 8 MG disintegrating tablet ?Commonly known as: ZOFRAN-ODT ?Take 1 tablet (8 mg total) by mouth 3 (three) times daily before meals. ?  ?prazosin 5 MG capsule ?Commonly known as: MINIPRESS ?Take 5 mg by mouth at bedtime. ?  ?pregabalin 200 MG capsule ?Commonly known as: LYRICA ?Take 200 mg by mouth 2 (two) times daily. ?  ?propranolol 40 MG tablet ?Commonly known as: INDERAL ?Take 40  mg by mouth 2 (two) times daily. ?  ?simvastatin 20 MG tablet ?Commonly known as: ZOCOR ?Take 10 mg by mouth daily at 6 PM. ?  ?sucralfate 1 g tablet ?Commonly known as: CARAFATE ?Take 1 g by mouth 3 (three) times daily. ?  ?tamsulosin 0.4 MG Caps capsule ?Commonly known as: FLOMAX ?Take 0.4 mg by mouth daily. ?  ?topiramate 100 MG tablet ?Commonly known as: TOPAMAX ?Take 100-200 mg by mouth 3 (three) times daily. Take 100 mg in the morning and at noon. Take 200 mg at bedtime ?  ?Trintellix 20 MG Tabs tablet ?Generic drug: vortioxetine HBr ?Take 20 mg by mouth daily. ?  ?Ubrelvy 100 MG Tabs ?Generic drug: Ubrogepant ?Take 100 mg by mouth daily as needed (migraines). ?  ?Vitamin D3 1.25 MG (50000 UT) Caps ?Take 1 capsule by mouth every Monday, Wednesday, and Friday. 3  times a week ?  ?Vraylar 3 MG capsule ?Generic drug: cariprazine ?Take 3 mg by mouth daily. ?  ?zolpidem 5 MG tablet ?Commonly known as: AMBIEN ?Take 5 mg by mouth at bedtime as needed for sleep. ?  ? ?  ? ?  ?  ? ? ?

## 2022-02-24 NOTE — TOC Transition Note (Signed)
Transition of Care (TOC) - CM/SW Discharge Note ? ? ?Patient Details  ?Name: Taiwo Gagliano ?MRN: [id number] ?Date of Birth: [birth year] ? ?Transition of Care (TOC) CM/SW Contact:  ?Boneta Lucks, RN ?Phone Number: ?02/24/2022, 12:19 PM ? ? ?Clinical Narrative:   Admitted with Cellulitis and abscess of leg. Discharging home today. Follow up appointments on AVS.  ?Barriers to Discharge: Barriers Resolved ? ?Patient Goals and CMS Choice ?Patient states their goals for this hospitalization and ongoing recovery are:: to go home. ?CMS Medicare.gov Compare Post Acute Care list provided to:: Patient ?  ? ?Discharge Placement ?  ?         ?Patient and family notified of of transfer: 02/24/22 ? ?      ? ? ?

## 2022-02-24 NOTE — Progress Notes (Signed)
Nsg Discharge Note ? ?Admit Date:  02/19/2022 ?Discharge date: 02/24/2022 ?  ?Blair Heys to be D/C'd Home per MD order.  AVS completed. ?Patient/caregiver able to verbalize understanding. ? ?Discharge Medication: ?Allergies as of 02/24/2022   ? ?   Reactions  ? Ciprofloxacin Hives  ? ?  ? ?  ?Medication List  ?  ? ?STOP taking these medications   ? ?insulin glargine 100 UNIT/ML injection ?Commonly known as: LANTUS ?  ? ?  ? ?TAKE these medications   ? ?acetaminophen 325 MG tablet ?Commonly known as: TYLENOL ?Take 2 tablets (650 mg total) by mouth every 6 (six) hours as needed for mild pain, fever or headache (or Fever >/= 101). ?  ?amoxicillin-clavulanate 875-125 MG tablet ?Commonly known as: Augmentin ?Take 1 tablet by mouth 2 (two) times daily for 5 days. ?  ?anagrelide 1 MG capsule ?Commonly known as: AGRYLIN ?Take 1 mg by mouth in the morning and at bedtime. ?  ?ARIPiprazole 20 MG tablet ?Commonly known as: ABILIFY ?Take 20 mg by mouth at bedtime. ?  ?Atrovent HFA 17 MCG/ACT inhaler ?Generic drug: ipratropium ?Inhale 1 puff into the lungs every 4 (four) hours as needed for wheezing. ?  ?cetirizine 10 MG tablet ?Commonly known as: ZYRTEC ?Take 10 mg by mouth daily. ?  ?chlorhexidine 4 % external liquid ?Commonly known as: HIBICLENS ?Apply 1 application topically daily as needed (for skin irritation as directed). ?  ?Creon 36000 UNITS Cpep capsule ?Generic drug: lipase/protease/amylase ?Take 36,000-72,000 Units by mouth See admin instructions. Take 2 capsules by mouth with meals and 1 capsule with snacks ?  ?cyclobenzaprine 10 MG tablet ?Commonly known as: FLEXERIL ?Take 10 mg by mouth 3 (three) times daily as needed for muscle spasms. ?  ?diclofenac Sodium 1 % Gel ?Commonly known as: VOLTAREN ?Apply 2 g topically daily as needed (for pain). ?  ?doxycycline 100 MG tablet ?Commonly known as: ADOXA ?Take 1 tablet (100 mg total) by mouth 2 (two) times daily for 5 days. ?  ?Emgality 120 MG/ML Soaj ?Generic drug:  Galcanezumab-gnlm ?Inject 1 Dose into the muscle every 30 (thirty) days. ?  ?fluticasone 50 MCG/ACT nasal spray ?Commonly known as: FLONASE ?Place 1 spray into both nostrils daily. ?  ?fluticasone-salmeterol 250-50 MCG/ACT Aepb ?Commonly known as: ADVAIR ?Inhale 1 puff into the lungs 2 (two) times daily. ?  ?Gemtesa 75 MG Tabs ?Generic drug: Vibegron ?Take 75 mg by mouth daily. ?  ?HumaLOG KwikPen 100 UNIT/ML KwikPen ?Generic drug: insulin lispro ?Inject 16 Units into the skin 3 (three) times daily before meals. ?  ?hydrOXYzine 25 MG tablet ?Commonly known as: ATARAX ?Take 25 mg by mouth 4 (four) times daily. ?  ?levETIRAcetam 750 MG tablet ?Commonly known as: KEPPRA ?Take 750 mg by mouth in the morning and at bedtime. ?  ?lidocaine 4 % external solution ?Commonly known as: XYLOCAINE ?Apply topically daily as needed for mild pain or moderate pain. ?  ?linaclotide 72 MCG capsule ?Commonly known as: LINZESS ?Take 72 mcg by mouth daily. ?  ?meclizine 25 MG tablet ?Commonly known as: ANTIVERT ?Take 25 mg by mouth every 6 (six) hours as needed for dizziness or nausea. ?  ?meloxicam 15 MG tablet ?Commonly known as: MOBIC ?Take 15 mg by mouth daily. ?  ?metaxalone 800 MG tablet ?Commonly known as: SKELAXIN ?Take 800 mg by mouth every 8 (eight) hours as needed for muscle spasms. ?  ?montelukast 10 MG tablet ?Commonly known as: SINGULAIR ?Take 10 mg by mouth daily. ?  ?  nitroGLYCERIN 0.4 MG SL tablet ?Commonly known as: NITROSTAT ?Place 0.4 mg under the tongue every 5 (five) minutes as needed for chest pain. ?  ?omeprazole 40 MG capsule ?Commonly known as: PRILOSEC ?Take 1 capsule (40 mg total) by mouth daily. ?  ?ondansetron 8 MG disintegrating tablet ?Commonly known as: ZOFRAN-ODT ?Take 1 tablet (8 mg total) by mouth 3 (three) times daily before meals. ?  ?prazosin 5 MG capsule ?Commonly known as: MINIPRESS ?Take 5 mg by mouth at bedtime. ?  ?pregabalin 200 MG capsule ?Commonly known as: LYRICA ?Take 200 mg by mouth 2 (two)  times daily. ?  ?propranolol 40 MG tablet ?Commonly known as: INDERAL ?Take 40 mg by mouth 2 (two) times daily. ?  ?simvastatin 20 MG tablet ?Commonly known as: ZOCOR ?Take 10 mg by mouth daily at 6 PM. ?  ?sucralfate 1 g tablet ?Commonly known as: CARAFATE ?Take 1 g by mouth 3 (three) times daily. ?  ?tamsulosin 0.4 MG Caps capsule ?Commonly known as: FLOMAX ?Take 0.4 mg by mouth daily. ?  ?topiramate 100 MG tablet ?Commonly known as: TOPAMAX ?Take 100-200 mg by mouth 3 (three) times daily. Take 100 mg in the morning and at noon. Take 200 mg at bedtime ?  ?Trintellix 20 MG Tabs tablet ?Generic drug: vortioxetine HBr ?Take 20 mg by mouth daily. ?  ?Ubrelvy 100 MG Tabs ?Generic drug: Ubrogepant ?Take 100 mg by mouth daily as needed (migraines). ?  ?Vitamin D3 1.25 MG (50000 UT) Caps ?Take 1 capsule by mouth every Monday, Wednesday, and Friday. 3 times a week ?  ?Vraylar 3 MG capsule ?Generic drug: cariprazine ?Take 3 mg by mouth daily. ?  ?zolpidem 5 MG tablet ?Commonly known as: AMBIEN ?Take 5 mg by mouth at bedtime as needed for sleep. ?  ? ?  ? ?  ?  ? ? ?  ?Discharge Care Instructions  ?(From admission, onward)  ?  ? ? ?  ? ?  Start     Ordered  ? 02/24/22 0000  Discharge wound care:       ?Comments: Avoid pressure. Follow with PCP and podiatry  ? 02/24/22 0957  ? ?  ?  ? ?  ? ? ?Discharge Assessment: ?Vitals:  ? 02/24/22 0522 02/24/22 0723  ?BP: 98/63   ?Pulse: 87   ?Resp: 16   ?Temp: 98.9 ?F (37.2 ?C)   ?SpO2: 97% 96%  ? Skin clean, dry and intact without evidence of skin break down, no evidence of skin tears noted. ?IV catheter discontinued intact. Site without signs and symptoms of complications - no redness or edema noted at insertion site, patient denies c/o pain - only slight tenderness at site.  Dressing with slight pressure applied. ? ?D/c Instructions-Education: ?Discharge instructions given to patient/family with verbalized understanding. ?D/c education completed with patient/family including follow up  instructions, medication list, d/c activities limitations if indicated, with other d/c instructions as indicated by MD - patient able to verbalize understanding, all questions fully answered. ?Patient instructed to return to ED, call 911, or call MD for any changes in condition.  ?Patient escorted via WC, and D/C home via private auto. ? ?Luther Bradley, RN ?02/24/2022 11:03 AM   ?

## 2022-02-27 LAB — AEROBIC/ANAEROBIC CULTURE W GRAM STAIN (SURGICAL/DEEP WOUND): Gram Stain: NONE SEEN

## 2022-03-10 ENCOUNTER — Emergency Department
Admission: EM | Admit: 2022-03-10 | Discharge: 2022-03-11 | Disposition: A | Discharge status: Home / Self Care | Payer: Medicare Other | Attending: Emergency Medicine | Admitting: Emergency Medicine

## 2022-03-10 ENCOUNTER — Emergency Department: Payer: Medicare Other

## 2022-03-10 ENCOUNTER — Other Ambulatory Visit: Payer: Self-pay

## 2022-03-10 DIAGNOSIS — Z9483 Pancreas transplant status: Secondary | ICD-10-CM | POA: Insufficient documentation

## 2022-03-10 DIAGNOSIS — S199XXA Unspecified injury of neck, initial encounter: Secondary | ICD-10-CM | POA: Diagnosis not present

## 2022-03-10 DIAGNOSIS — Z794 Long term (current) use of insulin: Secondary | ICD-10-CM | POA: Diagnosis not present

## 2022-03-10 DIAGNOSIS — S060X9A Concussion with loss of consciousness of unspecified duration, initial encounter: Secondary | ICD-10-CM | POA: Insufficient documentation

## 2022-03-10 DIAGNOSIS — N179 Acute kidney failure, unspecified: Secondary | ICD-10-CM | POA: Diagnosis not present

## 2022-03-10 DIAGNOSIS — E1040 Type 1 diabetes mellitus with diabetic neuropathy, unspecified: Secondary | ICD-10-CM | POA: Diagnosis not present

## 2022-03-10 DIAGNOSIS — S0990XA Unspecified injury of head, initial encounter: Secondary | ICD-10-CM | POA: Diagnosis present

## 2022-03-10 DIAGNOSIS — S299XXA Unspecified injury of thorax, initial encounter: Secondary | ICD-10-CM | POA: Insufficient documentation

## 2022-03-10 DIAGNOSIS — Y92410 Unspecified street and highway as the place of occurrence of the external cause: Secondary | ICD-10-CM | POA: Insufficient documentation

## 2022-03-10 DIAGNOSIS — Z79899 Other long term (current) drug therapy: Secondary | ICD-10-CM | POA: Diagnosis not present

## 2022-03-10 DIAGNOSIS — E1065 Type 1 diabetes mellitus with hyperglycemia: Secondary | ICD-10-CM | POA: Diagnosis not present

## 2022-03-10 DIAGNOSIS — R739 Hyperglycemia, unspecified: Secondary | ICD-10-CM

## 2022-03-10 LAB — CBC WITH DIFFERENTIAL/PLATELET
Abs Immature Granulocytes: 0.09 10*3/uL — ABNORMAL HIGH (ref 0.00–0.07)
Basophils Absolute: 0.1 10*3/uL (ref 0.0–0.1)
Basophils Relative: 1 %
Eosinophils Absolute: 0.3 10*3/uL (ref 0.0–0.5)
Eosinophils Relative: 3 %
HCT: 39.9 % (ref 39.0–52.0)
Hemoglobin: 13.4 g/dL (ref 13.0–17.0)
Immature Granulocytes: 1 %
Lymphocytes Relative: 37 %
Lymphs Abs: 4.4 10*3/uL — ABNORMAL HIGH (ref 0.7–4.0)
MCH: 30.5 pg (ref 26.0–34.0)
MCHC: 33.6 g/dL (ref 30.0–36.0)
MCV: 90.7 fL (ref 80.0–100.0)
Monocytes Absolute: 0.9 10*3/uL (ref 0.1–1.0)
Monocytes Relative: 8 %
Neutro Abs: 6.1 10*3/uL (ref 1.7–7.7)
Neutrophils Relative %: 50 %
Platelets: 170 10*3/uL (ref 150–400)
RBC: 4.4 MIL/uL (ref 4.22–5.81)
RDW: 13.7 % (ref 11.5–15.5)
WBC: 12 10*3/uL — ABNORMAL HIGH (ref 4.0–10.5)
nRBC: 0 % (ref 0.0–0.2)

## 2022-03-10 LAB — COMPREHENSIVE METABOLIC PANEL
ALT: 36 U/L (ref 0–44)
AST: 30 U/L (ref 15–41)
Albumin: 4.3 g/dL (ref 3.5–5.0)
Alkaline Phosphatase: 134 U/L — ABNORMAL HIGH (ref 38–126)
Anion gap: 7 (ref 5–15)
BUN: 16 mg/dL (ref 6–20)
CO2: 21 mmol/L — ABNORMAL LOW (ref 22–32)
Calcium: 9.5 mg/dL (ref 8.9–10.3)
Chloride: 109 mmol/L (ref 98–111)
Creatinine, Ser: 1.31 mg/dL — ABNORMAL HIGH (ref 0.61–1.24)
GFR, Estimated: >60 mL/min (ref >60)
Glucose, Bld: 374 mg/dL — ABNORMAL HIGH (ref 70–99)
Potassium: 3.7 mmol/L (ref 3.5–5.1)
Sodium: 137 mmol/L (ref 135–145)
Total Bilirubin: 0.7 mg/dL (ref 0.3–1.2)
Total Protein: 7.4 g/dL (ref 6.5–8.1)

## 2022-03-10 LAB — BLOOD GAS, VENOUS
Acid-base deficit: 1.9 mmol/L (ref 0.0–2.0)
Bicarbonate: 24.3 mmol/L (ref 20.0–28.0)
O2 Saturation: 46.2 %
Patient temperature: 37
pCO2, Ven: 46 mmHg (ref 44–60)
pH, Ven: 7.33 (ref 7.25–7.43)
pO2, Ven: <31 mmHg — CL (ref 32–45)

## 2022-03-10 LAB — LACTIC ACID, PLASMA: Lactic Acid, Venous: 2.5 mmol/L (ref 0.5–1.9)

## 2022-03-10 LAB — CBG MONITORING, ED: Glucose-Capillary: 365 mg/dL — ABNORMAL HIGH (ref 70–99)

## 2022-03-10 MED ORDER — KETOROLAC TROMETHAMINE 30 MG/ML IJ SOLN
30.0000 mg | Freq: Once | INTRAMUSCULAR | Status: DC
  Filled 2022-03-10: qty 1

## 2022-03-10 MED ORDER — SODIUM CHLORIDE 0.9 % IV BOLUS
1000.0000 mL | Freq: Once | INTRAVENOUS | Status: AC
  Administered 2022-03-10: 1000 mL via INTRAVENOUS

## 2022-03-10 MED ORDER — SODIUM CHLORIDE 0.9 % IV BOLUS (SEPSIS)
1000.0000 mL | Freq: Once | INTRAVENOUS | Status: AC
  Administered 2022-03-11: 1000 mL via INTRAVENOUS

## 2022-03-10 MED ORDER — SODIUM CHLORIDE 0.9 % IV BOLUS (SEPSIS): 1000.0000 mL | Freq: Once | INTRAVENOUS | Status: DC

## 2022-03-10 NOTE — ED Notes (Signed)
Repeat BGL 365 ?
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
## 2022-03-10 NOTE — ED Notes (Signed)
Provider at bedside to evaluate pt.
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
## 2022-03-10 NOTE — ED Notes (Signed)
Patient taken to CT via stretcher.

## 2022-03-11 DIAGNOSIS — S060X9A Concussion with loss of consciousness of unspecified duration, initial encounter: Secondary | ICD-10-CM | POA: Diagnosis not present

## 2022-03-11 LAB — CBG MONITORING, ED: Glucose-Capillary: 114 mg/dL — ABNORMAL HIGH (ref 70–99)

## 2022-03-11 LAB — URINALYSIS, ROUTINE W REFLEX MICROSCOPIC
Bacteria, UA: NONE SEEN
Bilirubin Urine: NEGATIVE
Glucose, UA: >=500 mg/dL — AB
Ketones, ur: NEGATIVE mg/dL
Leukocytes,Ua: NEGATIVE
Nitrite: NEGATIVE
Protein, ur: 30 mg/dL — AB
Specific Gravity, Urine: 1.035 — ABNORMAL HIGH (ref 1.005–1.030)
pH: 5 (ref 5.0–8.0)

## 2022-03-11 LAB — URINE DRUG SCREEN, QUALITATIVE (ARMC ONLY)
Amphetamines, Ur Screen: NOT DETECTED
Barbiturates, Ur Screen: NOT DETECTED
Benzodiazepine, Ur Scrn: NOT DETECTED
Cannabinoid 50 Ng, Ur ~~LOC~~: POSITIVE — AB
Cocaine Metabolite,Ur ~~LOC~~: NOT DETECTED
MDMA (Ecstasy)Ur Screen: NOT DETECTED
Methadone Scn, Ur: NOT DETECTED
Opiate, Ur Screen: NOT DETECTED
Phencyclidine (PCP) Ur S: NOT DETECTED
Tricyclic, Ur Screen: POSITIVE — AB

## 2022-03-11 LAB — LACTIC ACID, PLASMA: Lactic Acid, Venous: 1.8 mmol/L (ref 0.5–1.9)

## 2022-03-11 LAB — ETHANOL: Alcohol, Ethyl (B): <10 mg/dL (ref <10)

## 2022-03-11 MED ORDER — KETOROLAC TROMETHAMINE 30 MG/ML IJ SOLN
30.0000 mg | Freq: Once | INTRAMUSCULAR | Status: AC
  Administered 2022-03-11: 30 mg via INTRAVENOUS

## 2022-03-11 NOTE — ED Notes (Signed)
Patient resting on stretcher in room with eyes closed. Patient arouses easilly to voice. Patient also falls asleep easily unless continuously stimulated. Patient's repeat lactic was drawn and sent to lab. Patient's repeat BGL was completed and reported to provider. Patient was given urinal and encouraged to provide specimen. ?

## 2022-03-11 NOTE — Discharge Instructions (Addendum)
Your CTs and x-rays showed no traumatic injury. ? ?Your blood glucose was elevated but improved with IV fluids and you are not in DKA.  Please continue your insulin as prescribed and monitor your blood glucose closely. ? ?Your kidney function was slightly elevated today with a creatinine of 1.31.  I recommend you increase your fluid intake over the next week and follow-up with your doctor in 1 week to have your kidney function rechecked.  You may take Tylenol 1000 mg every 6 hours as needed for pain.  This medication is found over-the-counter.  I do recommend however that you avoid NSAIDs due to your elevated kidney function such as ibuprofen, Motrin, Advil, aspirin, Goody powders, Aleve, naproxen. ?

## 2022-03-11 NOTE — ED Notes (Signed)
Patient awakens to verbal cues. He is awake and speaks with myself and the provider as she updates him on his test results and plan of care. Patient states his mother is on the way. Patient was provided his discharge instructions and follow-up. Patient verbalized understanding then got dressed. Patient was taken out by wheelchair to the waiting room. ?

## 2022-03-29 ENCOUNTER — Encounter (HOSPITAL_COMMUNITY): Payer: Self-pay | Admitting: *Deleted

## 2022-03-29 ENCOUNTER — Emergency Department (HOSPITAL_COMMUNITY)
Admission: EM | Admit: 2022-03-29 | Discharge: 2022-03-29 | Disposition: A | Discharge status: Home / Self Care | Payer: Medicare Other | Attending: Emergency Medicine | Admitting: Emergency Medicine

## 2022-03-29 ENCOUNTER — Other Ambulatory Visit: Payer: Self-pay

## 2022-03-29 DIAGNOSIS — S50862A Insect bite (nonvenomous) of left forearm, initial encounter: Secondary | ICD-10-CM | POA: Diagnosis present

## 2022-03-29 DIAGNOSIS — E119 Type 2 diabetes mellitus without complications: Secondary | ICD-10-CM | POA: Insufficient documentation

## 2022-03-29 DIAGNOSIS — Z794 Long term (current) use of insulin: Secondary | ICD-10-CM | POA: Insufficient documentation

## 2022-03-29 DIAGNOSIS — W57XXXA Bitten or stung by nonvenomous insect and other nonvenomous arthropods, initial encounter: Secondary | ICD-10-CM | POA: Diagnosis not present

## 2022-03-29 MED ORDER — ACETAMINOPHEN 325 MG PO TABS
650.0000 mg | ORAL_TABLET | Freq: Once | ORAL | Status: AC
  Administered 2022-03-29: 650 mg via ORAL
  Filled 2022-03-29: qty 2

## 2022-03-29 MED ORDER — IBUPROFEN 400 MG PO TABS
600.0000 mg | ORAL_TABLET | Freq: Once | ORAL | Status: AC
  Administered 2022-03-29: 600 mg via ORAL
  Filled 2022-03-29: qty 2

## 2022-03-29 MED ORDER — DOXYCYCLINE HYCLATE 100 MG PO CAPS: 100.0000 mg | ORAL_CAPSULE | Freq: Two times a day (BID) | ORAL | 0 refills | Status: AC

## 2022-03-29 NOTE — Discharge Instructions (Addendum)
Please take Ibuprofen (Advil, motrin) and Tylenol (acetaminophen) to relieve your pain.   ? ?You may take up to 600 MG (3 pills) of normal strength ibuprofen every 8 hours as needed.   ?You make take tylenol, up to 1,000 mg (two extra strength pills) every 8 hours as needed.  ? ?It is safe to take ibuprofen and tylenol at the same time as they work differently.  ? Do not take more than 3,000 mg tylenol in a 24 hour period (not more than one dose every 8 hours.  Please check all medication labels as many medications such as pain and cold medications may contain tylenol.  Do not drink alcohol while taking these medications.  Do not take other NSAID'S while taking ibuprofen (such as aleve or naproxen).  Please take ibuprofen with food to decrease stomach upset. ? ?You may have diarrhea from the antibiotics.  It is very important that you continue to take the antibiotics even if you get diarrhea unless a medical professional tells you that you may stop taking them.  If you stop too early the bacteria you are being treated for will become stronger and you may need different, more powerful antibiotics that have more side effects and worsening diarrhea.  Please stay well hydrated and consider probiotics as they may decrease the severity of your diarrhea.  ? ?Doxycycline may make you more likely to sunburn.  Please use caution when being outside. ? ?If you develop true numbness, true weakness, or have any new or concerning symptoms please seek additional medical care and evaluation. ?

## 2022-03-29 NOTE — ED Notes (Signed)
Pt states that his left forearm into hand has tingling and numbness sensations that started after removing a tick from anterior left forearm near wrist. A red bump is noted where pt pulled tick off. Pt could feel touch and had palpable pulses ?

## 2022-03-29 NOTE — ED Provider Notes (Signed)
?[facility] ?Provider Note ? ? ?CSN: [id number] ?Arrival date & time: 03/29/22  1840 ? ?  ? ?History ? ?Chief Complaint  ?Patient presents with  ? Tick Removal  ? ? ?Dennis Yang is a 36 y.o. male with a past medical history of DM, schizoaffective disorder, bipolar 1, depression, seizures, anxiety, who presents today with family for concern of a tick bite. ?He had a tick bite his left forearm yesterday and today has developed decrease sensation through the left hand and arm.  He denies any fevers and otherwise feels well.  He reports pain with moving his right hand and wrist localized over a small area of swelling where the tick bite was.  No rashes. ? ?HPI ? ?  ? ?Home Medications ?Prior to Admission medications   ?Medication Sig Start Date End Date Taking? Authorizing Provider  ?doxycycline (VIBRAMYCIN) 100 MG capsule Take 1 capsule (100 mg total) by mouth 2 (two) times daily for 7 days. 03/29/22 04/05/22 Yes Cristina Gong, PA-C  ?acetaminophen (TYLENOL) 325 MG tablet Take 2 tablets (650 mg total) by mouth every 6 (six) hours as needed for mild pain, fever or headache (or Fever >/= 101). 08/13/19   Emokpae, Courage, MD  ?anagrelide (AGRYLIN) 1 MG capsule Take 1 mg by mouth in the morning and at bedtime. 05/27/20   [provider]  ?ARIPiprazole (ABILIFY) 20 MG tablet Take 20 mg by mouth at bedtime. 07/11/21   [provider]  ?ATROVENT HFA 17 MCG/ACT inhaler Inhale 1 puff into the lungs every 4 (four) hours as needed for wheezing.  01/16/19   [provider]  ?cetirizine (ZYRTEC) 10 MG tablet Take 10 mg by mouth daily. 11/16/19   [provider]  ?chlorhexidine (HIBICLENS) 4 % external liquid Apply 1 application topically daily as needed (for skin irritation as directed).  01/13/20   [provider]  ?Cholecalciferol (VITAMIN D3) 1.25 MG (50000 UT) CAPS Take 1 capsule by mouth every Monday, Wednesday, and Friday. 3 times a week 12/24/18   [provider]  ?CREON 36000 units CPEP capsule Take 36,000-72,000 Units by mouth See admin instructions. Take 2 capsules by mouth with meals and 1 capsule with snacks 11/16/19   [provider]  ?cyclobenzaprine (FLEXERIL) 10 MG tablet Take 10 mg by mouth 3 (three) times daily as needed for muscle spasms. 04/19/20   [provider]  ?diclofenac Sodium (VOLTAREN) 1 % GEL Apply 2 g topically daily as needed (for pain).  11/23/19   [provider]  ?EMGALITY 120 MG/ML SOAJ Inject 1 Dose into the muscle every 30 (thirty) days.  11/24/18   [provider]  ?fluticasone (FLONASE) 50 MCG/ACT nasal spray Place 1 spray into both nostrils daily.  01/20/19   [provider]  ?fluticasone-salmeterol (ADVAIR) 250-50 MCG/ACT AEPB Inhale 1 puff into the lungs 2 (two) times daily. 07/11/21   [provider]  ?Catalina Lunger 100 UNIT/ML KwikPen Inject 16 Units into the skin 3 (three) times daily before meals. 03/20/21   [provider]  ?hydrOXYzine (ATARAX/VISTARIL) 25 MG tablet Take 25 mg by mouth 4 (four) times daily. 07/11/21   [provider]  ?levETIRAcetam (KEPPRA) 750 MG tablet Take 750 mg by mouth in the morning and at bedtime.    [provider]  ?lidocaine (XYLOCAINE) 4 % external solution Apply topically daily as needed for mild pain or moderate pain.  11/23/19   [provider]  ?linaclotide Karlene Einstein) 72 MCG capsule Take  72 mcg by mouth daily. 10/14/20   [provider]  ?meclizine (ANTIVERT) 25 MG tablet Take 25 mg by mouth every 6 (six) hours as needed for dizziness or nausea.  10/01/19   [provider]  ?meloxicam (MOBIC) 15 MG tablet Take 15 mg by mouth daily. 07/13/20   [provider]  ?metaxalone (SKELAXIN) 800 MG tablet Take 800 mg by mouth every 8 (eight) hours as needed for muscle spasms.  12/03/19   [provider]  ?montelukast (SINGULAIR) 10 MG tablet Take 10 mg by mouth daily.  01/12/19    [provider]  ?nitroGLYCERIN (NITROSTAT) 0.4 MG SL tablet Place 0.4 mg under the tongue every 5 (five) minutes as needed for chest pain.  08/18/19   [provider]  ?omeprazole (PRILOSEC) 40 MG capsule Take 1 capsule (40 mg total) by mouth daily. 12/19/19   Shon Hale, MD  ?ondansetron (ZOFRAN-ODT) 8 MG disintegrating tablet Take 1 tablet (8 mg total) by mouth 3 (three) times daily before meals. 07/18/21   Johnson, Clanford L, MD  ?prazosin (MINIPRESS) 5 MG capsule Take 5 mg by mouth at bedtime. 06/30/21   [provider]  ?pregabalin (LYRICA) 200 MG capsule Take 200 mg by mouth 2 (two) times daily.    [provider]  ?propranolol (INDERAL) 40 MG tablet Take 40 mg by mouth 2 (two) times daily.  01/12/19   [provider]  ?simvastatin (ZOCOR) 20 MG tablet Take 10 mg by mouth daily at 6 PM.  12/25/18   [provider]  ?sucralfate (CARAFATE) 1 g tablet Take 1 g by mouth 3 (three) times daily. 07/11/21   [provider]  ?tamsulosin (FLOMAX) 0.4 MG CAPS capsule Take 0.4 mg by mouth daily. 02/02/22   [provider]  ?topiramate (TOPAMAX) 100 MG tablet Take 100-200 mg by mouth 3 (three) times daily. Take 100 mg in the morning and at noon. Take 200 mg at bedtime 05/18/19   [provider]  ?TRINTELLIX 20 MG TABS tablet Take 20 mg by mouth daily. 01/12/22   [provider]  ?UBRELVY 100 MG TABS Take 100 mg by mouth daily as needed (migraines). 07/11/21   [provider]  ?Vibegron (GEMTESA) 75 MG TABS Take 75 mg by mouth daily.    [provider]  ?VRAYLAR 3 MG capsule Take 3 mg by mouth daily. 02/01/22   [provider]  ?zolpidem (AMBIEN) 5 MG tablet Take 5 mg by mouth at bedtime as needed for sleep. 07/11/21   [provider]  ?   ? ?Allergies    ?Ciprofloxacin   ? ?Review of Systems   ?Review of Systems ? ?Physical Exam ?Updated Vital Signs ?BP 98/66 (BP Location: Right Arm)   Pulse 98   Temp 98.3 ?F  (36.8 ?C) (Oral)   Resp 16   Ht 6' (1.829 m)   Wt 79 kg   SpO2 96%   BMI 23.62 kg/m?  ?Physical Exam ?Vitals and nursing note reviewed.  ?Constitutional:   ?   General: He is not in acute distress. ?   Appearance: He is not diaphoretic.  ?HENT:  ?   Head: Normocephalic and atraumatic.  ?Eyes:  ?   General: No scleral icterus.    ?   Right eye: No discharge.     ?   Left eye: No discharge.  ?   Conjunctiva/sclera: Conjunctivae normal.  ?Cardiovascular:  ?   Rate and Rhythm: Normal rate and regular rhythm.  ?  Pulmonary:  ?   Effort: Pulmonary effort is normal. No respiratory distress.  ?   Breath sounds: No stridor.  ?Abdominal:  ?   General: There is no distension.  ?Musculoskeletal:     ?   General: No deformity.  ?   Cervical back: Normal range of motion and neck supple.  ?   Comments: 5/5 grip strength bilaterally.  Patient has full active range of motion of bilateral wrists and forearms/arms.  Compartments in bilateral arms are soft and easily compressible. ?  ?Skin: ?   General: Skin is warm and dry.  ?   Comments: There is a small, approximately 1 cm area of induration around where patient reports the tick was removed yesterday.  There is no marked abnormal erythema however there is a tattoo covering part of this area.  ?Neurological:  ?   Mental Status: He is alert.  ?   Motor: No abnormal muscle tone.  ?   Comments: Patient has diffuse subjective decrease sensation to light touch through left hand over both the palmar and dorsal aspect and all fingers evenly.  Subjective decrease sensation to light touch extends through entire forearm, and over the bicep, not over the triceps or torso.  ?Psychiatric:  ?   Comments: Flat and odd affect.  ? ? ?ED Results / Procedures / Treatments   ?Labs ?(all labs ordered are listed, but only abnormal results are displayed) ?Labs Reviewed - No data to display ? ?EKG ?None ? ?Radiology ?No results found. ? ?Procedures ?Procedures  ? ? ?Medications Ordered in ED ?Medications   ?ibuprofen (ADVIL) tablet 600 mg (600 mg Oral Given 03/29/22 2023)  ?acetaminophen (TYLENOL) tablet 650 mg (650 mg Oral Given 03/29/22 2022)  ? ? ?ED Course/ Medical Decision Making/ A&P ?  ?                        ?Med

## 2022-03-29 NOTE — ED Triage Notes (Signed)
Pt c/o numbness to left hand after tick bite; he removed tick last night ?

## 2022-07-21 ENCOUNTER — Emergency Department (HOSPITAL_COMMUNITY): Payer: Medicaid - Out of State

## 2022-07-21 ENCOUNTER — Other Ambulatory Visit: Payer: Self-pay

## 2022-07-21 ENCOUNTER — Encounter (HOSPITAL_COMMUNITY): Payer: Self-pay | Admitting: *Deleted

## 2022-07-21 ENCOUNTER — Emergency Department (HOSPITAL_COMMUNITY)
Admission: EM | Admit: 2022-07-21 | Discharge: 2022-07-21 | Disposition: A | Discharge status: Home / Self Care | Payer: Medicaid - Out of State | Attending: Emergency Medicine | Admitting: Emergency Medicine

## 2022-07-21 DIAGNOSIS — E109 Type 1 diabetes mellitus without complications: Secondary | ICD-10-CM | POA: Insufficient documentation

## 2022-07-21 DIAGNOSIS — R1084 Generalized abdominal pain: Secondary | ICD-10-CM | POA: Insufficient documentation

## 2022-07-21 DIAGNOSIS — Z794 Long term (current) use of insulin: Secondary | ICD-10-CM | POA: Diagnosis not present

## 2022-07-21 DIAGNOSIS — R112 Nausea with vomiting, unspecified: Secondary | ICD-10-CM | POA: Insufficient documentation

## 2022-07-21 LAB — URINALYSIS, ROUTINE W REFLEX MICROSCOPIC
Bacteria, UA: NONE SEEN
Bilirubin Urine: NEGATIVE
Glucose, UA: >=500 mg/dL — AB
Ketones, ur: 20 mg/dL — AB
Leukocytes,Ua: NEGATIVE
Nitrite: NEGATIVE
Protein, ur: 30 mg/dL — AB
Specific Gravity, Urine: 1.044 — ABNORMAL HIGH (ref 1.005–1.030)
pH: 5 (ref 5.0–8.0)

## 2022-07-21 LAB — CBC WITH DIFFERENTIAL/PLATELET
Abs Immature Granulocytes: 0.05 10*3/uL (ref 0.00–0.07)
Basophils Absolute: 0.1 10*3/uL (ref 0.0–0.1)
Basophils Relative: 1 %
Eosinophils Absolute: 0.3 10*3/uL (ref 0.0–0.5)
Eosinophils Relative: 2 %
HCT: 34.7 % — ABNORMAL LOW (ref 39.0–52.0)
Hemoglobin: 11.9 g/dL — ABNORMAL LOW (ref 13.0–17.0)
Immature Granulocytes: 0 %
Lymphocytes Relative: 36 %
Lymphs Abs: 4.7 10*3/uL — ABNORMAL HIGH (ref 0.7–4.0)
MCH: 31.4 pg (ref 26.0–34.0)
MCHC: 34.3 g/dL (ref 30.0–36.0)
MCV: 91.6 fL (ref 80.0–100.0)
Monocytes Absolute: 1.1 10*3/uL — ABNORMAL HIGH (ref 0.1–1.0)
Monocytes Relative: 8 %
Neutro Abs: 6.9 10*3/uL (ref 1.7–7.7)
Neutrophils Relative %: 53 %
Platelets: 149 10*3/uL — ABNORMAL LOW (ref 150–400)
RBC: 3.79 MIL/uL — ABNORMAL LOW (ref 4.22–5.81)
RDW: 14.1 % (ref 11.5–15.5)
WBC: 13.2 10*3/uL — ABNORMAL HIGH (ref 4.0–10.5)
nRBC: 0 % (ref 0.0–0.2)

## 2022-07-21 LAB — COMPREHENSIVE METABOLIC PANEL WITH GFR
ALT: 40 U/L (ref 0–44)
AST: 23 U/L (ref 15–41)
Albumin: 4 g/dL (ref 3.5–5.0)
Alkaline Phosphatase: 114 U/L (ref 38–126)
Anion gap: 7 (ref 5–15)
BUN: 32 mg/dL — ABNORMAL HIGH (ref 6–20)
CO2: 21 mmol/L — ABNORMAL LOW (ref 22–32)
Calcium: 9.1 mg/dL (ref 8.9–10.3)
Chloride: 113 mmol/L — ABNORMAL HIGH (ref 98–111)
Creatinine, Ser: 1.22 mg/dL (ref 0.61–1.24)
GFR, Estimated: >60 mL/min
Glucose, Bld: 124 mg/dL — ABNORMAL HIGH (ref 70–99)
Potassium: 3.3 mmol/L — ABNORMAL LOW (ref 3.5–5.1)
Sodium: 141 mmol/L (ref 135–145)
Total Bilirubin: 0.6 mg/dL (ref 0.3–1.2)
Total Protein: 6.8 g/dL (ref 6.5–8.1)

## 2022-07-21 LAB — LIPASE, BLOOD: Lipase: 17 U/L (ref 11–51)

## 2022-07-21 LAB — POC OCCULT BLOOD, ED: Fecal Occult Bld: NEGATIVE

## 2022-07-21 MED ORDER — HYDROMORPHONE HCL 1 MG/ML IJ SOLN
0.5000 mg | Freq: Once | INTRAMUSCULAR | Status: AC
  Administered 2022-07-21: 0.5 mg via INTRAVENOUS
  Filled 2022-07-21: qty 0.5

## 2022-07-21 MED ORDER — METOCLOPRAMIDE HCL 10 MG PO TABS: 20.0000 mg | ORAL_TABLET | Freq: Four times a day (QID) | ORAL | 0 refills | Status: DC | PRN

## 2022-07-21 MED ORDER — HEPARIN SOD (PORK) LOCK FLUSH 100 UNIT/ML IV SOLN
500.0000 [IU] | Freq: Once | INTRAVENOUS | Status: AC
  Administered 2022-07-21: 500 [IU] via INTRAVENOUS
  Filled 2022-07-21: qty 5

## 2022-07-21 MED ORDER — HYDROMORPHONE HCL 1 MG/ML IJ SOLN
1.0000 mg | Freq: Once | INTRAMUSCULAR | Status: AC
  Administered 2022-07-21: 1 mg via INTRAVENOUS
  Filled 2022-07-21: qty 1

## 2022-07-21 MED ORDER — IOHEXOL 300 MG/ML  SOLN
100.0000 mL | Freq: Once | INTRAMUSCULAR | Status: AC | PRN
Start: 2022-07-21 — End: 2022-07-21
  Administered 2022-07-21: 100 mL via INTRAVENOUS

## 2022-07-21 MED ORDER — LACTATED RINGERS IV BOLUS
1000.0000 mL | Freq: Once | INTRAVENOUS | Status: AC
  Administered 2022-07-21: 1000 mL via INTRAVENOUS

## 2022-07-21 MED ORDER — METOCLOPRAMIDE HCL 5 MG/ML IJ SOLN
10.0000 mg | Freq: Once | INTRAMUSCULAR | Status: AC
  Administered 2022-07-21: 10 mg via INTRAVENOUS
  Filled 2022-07-21: qty 2

## 2022-07-21 NOTE — ED Provider Notes (Signed)
[facility] EMERGENCY DEPARTMENT Provider Note   CSN: [id number] Arrival date & time: 07/21/22  1617     History  Chief Complaint  Patient presents with   Abdominal Pain    Dennis Yang is a 36 y.o. 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  Patient presents to ED for evaluation of nausea, vomiting and abdominal pain.  Patient reports that 3 years ago he was diagnosed with gastroparesis at his gastroenterologist office.  The patient reports that for the last 2 weeks he has had nausea, vomiting and abdominal pain.  Patient reports he has attempted to control his symptoms at home to no avail.  The patient reports in the last 4 days he has been unable to get out of bed, also with increased nausea vomiting and abdominal pain.  Patient reports in the last 4 days he has also had a few episodes of what he believes is blood in his stool.  The patient is denying any fevers, dysuria, shortness of breath.  Patient is endorsing nausea, vomiting, abdominal pain, lightheadedness, dizziness, weakness, blood in stool.   Abdominal Pain Associated symptoms: nausea and vomiting   Associated symptoms: no chills, no dysuria, no fever and no shortness of breath        Home Medications Prior to Admission medications   Medication Sig Start Date End Date Taking? Authorizing Provider  metoCLOPramide (REGLAN) 10 MG tablet Take 2 tablets (20 mg total) by mouth every 6 (six) hours as needed for nausea, vomiting or refractory nausea / vomiting. 07/21/22  Yes Al Decant, PA-C  acetaminophen (TYLENOL) 325 MG tablet Take 2 tablets (650 mg total) by mouth every 6 (six) hours as needed for mild pain, fever or headache (or Fever >/= 101). 08/13/19   Emokpae, Courage, MD  anagrelide (AGRYLIN) 1 MG capsule Take 1 mg by mouth in the morning and at bedtime. 05/27/20   [provider]   ARIPiprazole (ABILIFY) 20 MG tablet Take 20 mg by mouth at bedtime. 07/11/21   [provider]  ATROVENT HFA 17 MCG/ACT inhaler Inhale 1 puff into the lungs every 4 (four) hours as needed for wheezing.  01/16/19   [provider]  cetirizine (ZYRTEC) 10 MG tablet Take 10 mg by mouth daily. 11/16/19   [provider]  chlorhexidine (HIBICLENS) 4 % external liquid Apply 1 application topically daily as needed (for skin irritation as directed).  01/13/20   [provider]  Cholecalciferol (VITAMIN D3) 1.25 MG (50000 UT) CAPS Take 1 capsule by mouth every Monday, Wednesday, and Friday. 3 times a week 12/24/18   [provider]  CREON 36000 units CPEP capsule Take 36,000-72,000 Units by mouth See admin instructions. Take 2 capsules by mouth with meals and 1 capsule with snacks 11/16/19   [provider]  cyclobenzaprine (FLEXERIL) 10 MG tablet Take 10 mg by mouth 3 (three) times daily as needed for muscle spasms. 04/19/20   [provider]  diclofenac Sodium (VOLTAREN) 1 % GEL Apply 2 g topically daily as needed (for pain).  11/23/19   [provider]  EMGALITY 120 MG/ML SOAJ Inject 1 Dose into the muscle every 30 (thirty) days.  11/24/18   [provider]  fluticasone (FLONASE) 50 MCG/ACT nasal spray Place 1 spray into both nostrils daily.  01/20/19   [provider]  fluticasone-salmeterol (ADVAIR) 250-50 MCG/ACT AEPB Inhale 1 puff into the  lungs 2 (two) times daily. 07/11/21   [provider]  HUMALOG KWIKPEN 100 UNIT/ML KwikPen Inject 16 Units into the skin 3 (three) times daily before meals. 03/20/21   [provider]  hydrOXYzine (ATARAX/VISTARIL) 25 MG tablet Take 25 mg by mouth 4 (four) times daily. 07/11/21   [provider]  levETIRAcetam (KEPPRA) 750 MG tablet Take 750 mg by mouth in the morning and at bedtime.    [provider]  lidocaine (XYLOCAINE) 4 % external solution Apply  topically daily as needed for mild pain or moderate pain.  11/23/19   [provider]  linaclotide (LINZESS) 72 MCG capsule Take 72 mcg by mouth daily. 10/14/20   [provider]  meclizine (ANTIVERT) 25 MG tablet Take 25 mg by mouth every 6 (six) hours as needed for dizziness or nausea.  10/01/19   [provider]  meloxicam (MOBIC) 15 MG tablet Take 15 mg by mouth daily. 07/13/20   [provider]  metaxalone (SKELAXIN) 800 MG tablet Take 800 mg by mouth every 8 (eight) hours as needed for muscle spasms.  12/03/19   [provider]  montelukast (SINGULAIR) 10 MG tablet Take 10 mg by mouth daily.  01/12/19   [provider]  nitroGLYCERIN (NITROSTAT) 0.4 MG SL tablet Place 0.4 mg under the tongue every 5 (five) minutes as needed for chest pain.  08/18/19   [provider]  omeprazole (PRILOSEC) 40 MG capsule Take 1 capsule (40 mg total) by mouth daily. 12/19/19   Shon HaleEmokpae, Courage, MD  ondansetron (ZOFRAN-ODT) 8 MG disintegrating tablet Take 1 tablet (8 mg total) by mouth 3 (three) times daily before meals. 07/18/21   Johnson, Clanford L, MD  prazosin (MINIPRESS) 5 MG capsule Take 5 mg by mouth at bedtime. 06/30/21   [provider]  pregabalin (LYRICA) 200 MG capsule Take 200 mg by mouth 2 (two) times daily.    [provider]  propranolol (INDERAL) 40 MG tablet Take 40 mg by mouth 2 (two) times daily.  01/12/19   [provider]  simvastatin (ZOCOR) 20 MG tablet Take 10 mg by mouth daily at 6 PM.  12/25/18   [provider]  sucralfate (CARAFATE) 1 g tablet Take 1 g by mouth 3 (three) times daily. 07/11/21   [provider]  tamsulosin (FLOMAX) 0.4 MG CAPS capsule Take 0.4 mg by mouth daily. 02/02/22   [provider]  topiramate (TOPAMAX) 100 MG tablet Take 100-200 mg by mouth 3 (three) times daily. Take 100 mg in the morning and at noon. Take 200 mg at bedtime 05/18/19   [provider]   TRINTELLIX 20 MG TABS tablet Take 20 mg by mouth daily. 01/12/22   [provider]  UBRELVY 100 MG TABS Take 100 mg by mouth daily as needed (migraines). 07/11/21   [provider]  Vibegron (GEMTESA) 75 MG TABS Take 75 mg by mouth daily.    [provider]  VRAYLAR 3 MG capsule Take 3 mg by mouth daily. 02/01/22   [provider]  zolpidem (AMBIEN) 5 MG tablet Take 5 mg by mouth at bedtime as needed for sleep. 07/11/21   [provider]      Allergies    Ciprofloxacin    Review of Systems   Review of Systems  Constitutional:  Negative for chills and fever.  Respiratory:  Negative for shortness of breath.   Gastrointestinal:  Positive for abdominal pain, blood in stool, nausea and vomiting.  Genitourinary:  Negative for dysuria.  Neurological:  Positive for dizziness, weakness and light-headedness.    Physical Exam Updated Vital Signs BP (!) 94/53   Pulse 67   Temp 98.2 F (36.8 C) (Oral)   Resp 16   Ht 6' (1.829 m)   Wt 83.9 kg   SpO2 95%   BMI 25.09 kg/m  Physical Exam Vitals and nursing note reviewed.  Constitutional:      General: He is not in acute distress.    Appearance: He is ill-appearing. He is not toxic-appearing or diaphoretic.  HENT:     Head: Normocephalic and atraumatic.     Nose: Nose normal. No congestion.     Mouth/Throat:     Mouth: Mucous membranes are dry.     Pharynx: Oropharynx is clear.  Eyes:     Extraocular Movements: Extraocular movements intact.     Conjunctiva/sclera: Conjunctivae normal.     Pupils: Pupils are equal, round, and reactive to light.  Cardiovascular:     Rate and Rhythm: Normal rate and regular rhythm.  Pulmonary:     Effort: Pulmonary effort is normal.     Breath sounds: Normal breath sounds. No wheezing.  Abdominal:     General: Abdomen is flat. Bowel sounds are normal.     Palpations: Abdomen is soft.     Tenderness: There is generalized abdominal tenderness.  Musculoskeletal:      Cervical back: Normal range of motion and neck supple. No tenderness.  Skin:    General: Skin is warm and dry.     Capillary Refill: Capillary refill takes less than 2 seconds.  Neurological:     Mental Status: He is alert and oriented to person, place, and time.     ED Results / Procedures / Treatments   Labs (all labs ordered are listed, but only abnormal results are displayed) Labs Reviewed  CBC WITH DIFFERENTIAL/PLATELET - Abnormal; Notable for the following components:      Result Value   WBC 13.2 (*)    RBC 3.79 (*)    Hemoglobin 11.9 (*)    HCT 34.7 (*)    Platelets 149 (*)    Lymphs Abs 4.7 (*)    Monocytes Absolute 1.1 (*)    All other components within normal limits  COMPREHENSIVE METABOLIC PANEL - Abnormal; Notable for the following components:   Potassium 3.3 (*)    Chloride 113 (*)    CO2 21 (*)    Glucose, Bld 124 (*)    BUN 32 (*)    All other components within normal limits  URINALYSIS, ROUTINE W REFLEX MICROSCOPIC - Abnormal; Notable for the following components:   Specific Gravity, Urine 1.044 (*)    Glucose, UA >=500 (*)    Hgb urine dipstick SMALL (*)    Ketones, ur 20 (*)    Protein, ur 30 (*)    All other components within normal limits  LIPASE, BLOOD  OCCULT BLOOD X 1 CARD TO LAB, STOOL  POC OCCULT BLOOD, ED    EKG None  Radiology CT ABDOMEN PELVIS W CONTRAST  Result Date: 07/21/2022 CLINICAL DATA:  Abdominal pain with nausea and vomiting, history of prior pancreas transplant EXAM: CT ABDOMEN AND PELVIS WITH CONTRAST TECHNIQUE: Multidetector CT imaging of the abdomen and pelvis was performed using the standard protocol following bolus administration of intravenous contrast. RADIATION DOSE REDUCTION: This exam was performed according to the departmental dose-optimization program which includes automated exposure control, adjustment of the mA and/or kV according  to patient size and/or use of iterative reconstruction technique. CONTRAST:   OMNIPAQUE IOHEXOL 300 MG/ML  SOLN COMPARISON:  07/31/2020 FINDINGS: Lower chest: No acute abnormality. Hepatobiliary: No focal liver abnormality is seen. Status post cholecystectomy. No biliary dilatation. Minimal air is noted in the biliary tree consistent with the postoperative change. Pancreas: Pancreas is not well visualized. By history the patient has had a pancreatic transplant. Postsurgical changes along the distal stomach and proximal small bowel are seen likely related to the transplant although the transplanted organ is not well appreciated on today's exam. Spleen: Spleen is been surgically removed. Adrenals/Urinary Tract: Adrenal glands are within normal limits. Kidneys demonstrate scattered bilateral nonobstructing renal calculi similar to that seen on the prior study. Ureters are within normal limits. Bladder is well distended. Stomach/Bowel: No obstructive or inflammatory changes of colon are seen. The appendix is within normal limits. Postsurgical changes in the distal stomach and proximal small bowel are noted. No obstructive changes are seen. Vascular/Lymphatic: No significant vascular findings are present. No enlarged abdominal or pelvic lymph nodes. Reproductive: Prostate is unremarkable. Other: No abdominal wall hernia or abnormality. No abdominopelvic ascites. Musculoskeletal: No acute bony abnormality is noted. Old left iliac wing fracture is seen. InterStim device is noted on the right. IMPRESSION: Postsurgical changes without acute abnormality. Stable bilateral nonobstructing renal calculi. Electronically Signed   By: Alcide Clever M.D.   On: 07/21/2022 19:46    Procedures Procedures   Medications Ordered in ED Medications  lactated ringers bolus 1,000 mL (0 mLs Intravenous Stopped 07/21/22 2015)  metoCLOPramide (REGLAN) injection 10 mg (10 mg Intravenous Given 07/21/22 1817)  HYDROmorphone (DILAUDID) injection 1 mg (1 mg Intravenous Given 07/21/22 1818)  iohexol (OMNIPAQUE) 300 MG/ML  solution 100 mL (100 mLs Intravenous Contrast Given 07/21/22 1903)  lactated ringers bolus 1,000 mL (1,000 mLs Intravenous New Bag/Given 07/21/22 2054)  HYDROmorphone (DILAUDID) injection 0.5 mg (0.5 mg Intravenous Given 07/21/22 2118)  heparin lock flush 100 unit/mL (500 Units Intravenous Given 07/21/22 2214)    ED Course/ Medical Decision Making/ A&P                           Medical Decision Making  36 year old male presents to the ED for evaluation of abdominal pain, nausea and vomiting.  Please see HPI for further details of the event.  On examination, the patient is afebrile and nontachycardic.  The patient lung sounds are clear bilaterally, he is not hypoxic on room air.  Patient abdomen is soft and compressible in all 4 quadrants however the patient does endorse generalized abdominal tenderness that is nonfocal in nature, nonradiating.  The patient mucous membranes are dry.  The patient is ill-appearing however nontoxic appearing.  Patient follows commands appropriately.  Patient worked up utilizing the following labs and imaging studies interpreted by me personally: - Lipase unremarkable - Fecal occult negative - CMP with decreased potassium to 3.3 - CBC with elevated white blood cell count of 13.2 however on chart review this patient seems to have an elevated blood cell count consistently.  The patient had an elevated white blood cell count 4 months ago when he was seen for tick bite. - Urinalysis shows small hemoglobin, ketones, increased specific gravity - CT abdomen pelvis does not show any intra-abdominal abnormality, there is a stable appearance of the pancreatic transplant  Patient treated with the following medications: 1 mg Dilaudid, 0.5 mg Dilaudid, 1 L lactated Ringer's x2, 10 mg Reglan.  After  these medications were given, the patient showed the ability to tolerate oral liquids.  The patient passed p.o. fluid challenge.  At this time, the patient seems stable for discharge.   After discussion with my attending Dr. Lynelle Doctor, we feel that this patient is stable for discharge home.  The patient will be sent home with oral Reglan and advised to follow-up with his PCP.  The patient was provided with return precautions and he voiced understanding of these.  The patient had all of his questions answered to his satisfaction prior to discharge.  The patient is stable for discharge at this time.  Final Clinical Impression(s) / ED Diagnoses Final diagnoses:  Generalized abdominal pain  Nausea and vomiting, unspecified vomiting type    Rx / DC Orders ED Discharge Orders          Ordered    metoCLOPramide (REGLAN) 10 MG tablet  Every 6 hours PRN        07/21/22 2214              Clent Ridges 07/21/22 2215    Linwood Dibbles, MD 07/23/22 [phone]

## 2022-07-21 NOTE — Discharge Instructions (Addendum)
Please return to the ED with any new or worsening signs or symptoms such as increased nausea or vomiting, abdominal pain, fevers Please follow-up with the patient's gastroenterologist this week. Please pick up prescription for Reglan that I have sent in for you. Please read the attached guide concerning gastroparesis for further information

## 2022-07-21 NOTE — ED Notes (Signed)
Patient transported to CT 

## 2022-07-21 NOTE — ED Triage Notes (Signed)
Pt with abd pain with N/V, hx of gastroparesis.  Not able to eat today.  Pt with bright red stool x 4 days.  Pancreas transplant on May 2021.

## 2022-10-12 ENCOUNTER — Emergency Department (HOSPITAL_COMMUNITY): Payer: Medicare (Managed Care)

## 2022-10-12 ENCOUNTER — Other Ambulatory Visit: Payer: Self-pay

## 2022-10-12 ENCOUNTER — Encounter (HOSPITAL_COMMUNITY): Payer: Self-pay | Admitting: Internal Medicine

## 2022-10-12 ENCOUNTER — Inpatient Hospital Stay (HOSPITAL_COMMUNITY): Payer: Medicare (Managed Care)

## 2022-10-12 ENCOUNTER — Inpatient Hospital Stay (HOSPITAL_COMMUNITY)
Admission: EM | Admit: 2022-10-12 | Discharge: 2022-10-14 | DRG: 389 | Disposition: A | Discharge status: Home / Self Care | Payer: Medicare (Managed Care) | Attending: Internal Medicine | Admitting: Internal Medicine

## 2022-10-12 DIAGNOSIS — K56600 Partial intestinal obstruction, unspecified as to cause: Principal | ICD-10-CM | POA: Diagnosis present

## 2022-10-12 DIAGNOSIS — Z9483 Pancreas transplant status: Secondary | ICD-10-CM

## 2022-10-12 DIAGNOSIS — R1033 Periumbilical pain: Secondary | ICD-10-CM | POA: Diagnosis not present

## 2022-10-12 DIAGNOSIS — E782 Mixed hyperlipidemia: Secondary | ICD-10-CM | POA: Diagnosis present

## 2022-10-12 DIAGNOSIS — Z9489 Other transplanted organ and tissue status: Secondary | ICD-10-CM

## 2022-10-12 DIAGNOSIS — E860 Dehydration: Secondary | ICD-10-CM | POA: Diagnosis present

## 2022-10-12 DIAGNOSIS — K3184 Gastroparesis: Secondary | ICD-10-CM | POA: Diagnosis present

## 2022-10-12 DIAGNOSIS — Z90410 Acquired total absence of pancreas: Secondary | ICD-10-CM

## 2022-10-12 DIAGNOSIS — F4310 Post-traumatic stress disorder, unspecified: Secondary | ICD-10-CM | POA: Diagnosis present

## 2022-10-12 DIAGNOSIS — K56609 Unspecified intestinal obstruction, unspecified as to partial versus complete obstruction: Secondary | ICD-10-CM

## 2022-10-12 DIAGNOSIS — E876 Hypokalemia: Secondary | ICD-10-CM | POA: Diagnosis present

## 2022-10-12 DIAGNOSIS — R81 Glycosuria: Secondary | ICD-10-CM | POA: Diagnosis present

## 2022-10-12 DIAGNOSIS — Z841 Family history of disorders of kidney and ureter: Secondary | ICD-10-CM

## 2022-10-12 DIAGNOSIS — F319 Bipolar disorder, unspecified: Secondary | ICD-10-CM | POA: Diagnosis present

## 2022-10-12 DIAGNOSIS — E1143 Type 2 diabetes mellitus with diabetic autonomic (poly)neuropathy: Secondary | ICD-10-CM | POA: Diagnosis present

## 2022-10-12 DIAGNOSIS — R110 Nausea: Secondary | ICD-10-CM

## 2022-10-12 DIAGNOSIS — K219 Gastro-esophageal reflux disease without esophagitis: Secondary | ICD-10-CM | POA: Diagnosis present

## 2022-10-12 DIAGNOSIS — D696 Thrombocytopenia, unspecified: Secondary | ICD-10-CM | POA: Diagnosis present

## 2022-10-12 DIAGNOSIS — Z87891 Personal history of nicotine dependence: Secondary | ICD-10-CM

## 2022-10-12 DIAGNOSIS — E1165 Type 2 diabetes mellitus with hyperglycemia: Secondary | ICD-10-CM | POA: Diagnosis present

## 2022-10-12 DIAGNOSIS — Z8249 Family history of ischemic heart disease and other diseases of the circulatory system: Secondary | ICD-10-CM | POA: Diagnosis not present

## 2022-10-12 DIAGNOSIS — Z9081 Acquired absence of spleen: Secondary | ICD-10-CM

## 2022-10-12 DIAGNOSIS — E119 Type 2 diabetes mellitus without complications: Secondary | ICD-10-CM

## 2022-10-12 DIAGNOSIS — G43909 Migraine, unspecified, not intractable, without status migrainosus: Secondary | ICD-10-CM | POA: Diagnosis present

## 2022-10-12 DIAGNOSIS — Z79899 Other long term (current) drug therapy: Secondary | ICD-10-CM | POA: Diagnosis not present

## 2022-10-12 DIAGNOSIS — Z8669 Personal history of other diseases of the nervous system and sense organs: Secondary | ICD-10-CM

## 2022-10-12 DIAGNOSIS — R569 Unspecified convulsions: Secondary | ICD-10-CM | POA: Diagnosis not present

## 2022-10-12 DIAGNOSIS — Z794 Long term (current) use of insulin: Secondary | ICD-10-CM | POA: Diagnosis not present

## 2022-10-12 DIAGNOSIS — K8681 Exocrine pancreatic insufficiency: Secondary | ICD-10-CM | POA: Diagnosis present

## 2022-10-12 DIAGNOSIS — D72829 Elevated white blood cell count, unspecified: Secondary | ICD-10-CM

## 2022-10-12 DIAGNOSIS — Z8673 Personal history of transient ischemic attack (TIA), and cerebral infarction without residual deficits: Secondary | ICD-10-CM | POA: Diagnosis not present

## 2022-10-12 DIAGNOSIS — Z881 Allergy status to other antibiotic agents status: Secondary | ICD-10-CM

## 2022-10-12 DIAGNOSIS — Z818 Family history of other mental and behavioral disorders: Secondary | ICD-10-CM | POA: Diagnosis not present

## 2022-10-12 DIAGNOSIS — Z9049 Acquired absence of other specified parts of digestive tract: Secondary | ICD-10-CM

## 2022-10-12 HISTORY — DX: Other chronic pancreatitis: K86.1

## 2022-10-12 HISTORY — DX: Gastroparesis: K31.84

## 2022-10-12 LAB — COMPREHENSIVE METABOLIC PANEL
ALT: 36 U/L (ref 0–44)
AST: 35 U/L (ref 15–41)
Albumin: 3.6 g/dL (ref 3.5–5.0)
Alkaline Phosphatase: 104 U/L (ref 38–126)
Anion gap: 6 (ref 5–15)
BUN: 26 mg/dL — ABNORMAL HIGH (ref 6–20)
CO2: 21 mmol/L — ABNORMAL LOW (ref 22–32)
Calcium: 8.3 mg/dL — ABNORMAL LOW (ref 8.9–10.3)
Chloride: 113 mmol/L — ABNORMAL HIGH (ref 98–111)
Creatinine, Ser: 1.23 mg/dL (ref 0.61–1.24)
GFR, Estimated: >60 mL/min (ref >60)
Glucose, Bld: 221 mg/dL — ABNORMAL HIGH (ref 70–99)
Potassium: 3.2 mmol/L — ABNORMAL LOW (ref 3.5–5.1)
Sodium: 140 mmol/L (ref 135–145)
Total Bilirubin: 0.3 mg/dL (ref 0.3–1.2)
Total Protein: 6 g/dL — ABNORMAL LOW (ref 6.5–8.1)

## 2022-10-12 LAB — URINALYSIS, ROUTINE W REFLEX MICROSCOPIC
Bilirubin Urine: NEGATIVE
Glucose, UA: 150 mg/dL — AB
Hgb urine dipstick: NEGATIVE
Ketones, ur: NEGATIVE mg/dL
Leukocytes,Ua: NEGATIVE
Nitrite: NEGATIVE
Protein, ur: NEGATIVE mg/dL
Specific Gravity, Urine: 1.034 — ABNORMAL HIGH (ref 1.005–1.030)
pH: 5 (ref 5.0–8.0)

## 2022-10-12 LAB — CBG MONITORING, ED: Glucose-Capillary: 191 mg/dL — ABNORMAL HIGH (ref 70–99)

## 2022-10-12 LAB — CBC
HCT: 31.4 % — ABNORMAL LOW (ref 39.0–52.0)
Hemoglobin: 10.9 g/dL — ABNORMAL LOW (ref 13.0–17.0)
MCH: 32.3 pg (ref 26.0–34.0)
MCHC: 34.7 g/dL (ref 30.0–36.0)
MCV: 93.2 fL (ref 80.0–100.0)
Platelets: 120 10*3/uL — ABNORMAL LOW (ref 150–400)
RBC: 3.37 MIL/uL — ABNORMAL LOW (ref 4.22–5.81)
RDW: 13.6 % (ref 11.5–15.5)
WBC: 18.7 10*3/uL — ABNORMAL HIGH (ref 4.0–10.5)
nRBC: 0 % (ref 0.0–0.2)

## 2022-10-12 LAB — TROPONIN I (HIGH SENSITIVITY)
Troponin I (High Sensitivity): <2 ng/L (ref <18)
Troponin I (High Sensitivity): <2 ng/L (ref <18)

## 2022-10-12 LAB — LIPASE, BLOOD: Lipase: 20 U/L (ref 11–51)

## 2022-10-12 LAB — GLUCOSE, CAPILLARY: Glucose-Capillary: 166 mg/dL — ABNORMAL HIGH (ref 70–99)

## 2022-10-12 MED ORDER — PANCRELIPASE (LIP-PROT-AMYL) 12000-38000 UNITS PO CPEP: 36000.0000 [IU] | ORAL_CAPSULE | Freq: Every day | ORAL | Status: DC | PRN

## 2022-10-12 MED ORDER — DROPERIDOL 2.5 MG/ML IJ SOLN: 1.2500 mg | Freq: Once | INTRAMUSCULAR | Status: DC

## 2022-10-12 MED ORDER — MORPHINE SULFATE (PF) 4 MG/ML IV SOLN
4.0000 mg | Freq: Once | INTRAVENOUS | Status: AC
  Administered 2022-10-12: 4 mg via INTRAVENOUS
  Filled 2022-10-12: qty 1

## 2022-10-12 MED ORDER — MORPHINE SULFATE (PF) 4 MG/ML IV SOLN
6.0000 mg | Freq: Once | INTRAVENOUS | Status: AC
  Administered 2022-10-12: 6 mg via INTRAVENOUS
  Filled 2022-10-12: qty 2

## 2022-10-12 MED ORDER — PANTOPRAZOLE SODIUM 40 MG IV SOLR
40.0000 mg | Freq: Two times a day (BID) | INTRAVENOUS | Status: DC
  Administered 2022-10-12 – 2022-10-14 (×4): 40 mg via INTRAVENOUS
  Filled 2022-10-12 (×4): qty 10

## 2022-10-12 MED ORDER — LACTATED RINGERS IV BOLUS
1000.0000 mL | Freq: Once | INTRAVENOUS | Status: AC
  Administered 2022-10-12: 1000 mL via INTRAVENOUS

## 2022-10-12 MED ORDER — PROPRANOLOL HCL 20 MG PO TABS: 40.0000 mg | ORAL_TABLET | Freq: Two times a day (BID) | ORAL | Status: DC

## 2022-10-12 MED ORDER — LEVETIRACETAM 500 MG/5ML IV SOLN
INTRAVENOUS | Status: AC
  Filled 2022-10-12: qty 10

## 2022-10-12 MED ORDER — ARIPIPRAZOLE 10 MG PO TABS
20.0000 mg | ORAL_TABLET | Freq: Every day | ORAL | Status: DC
  Administered 2022-10-13 – 2022-10-14 (×2): 20 mg via ORAL
  Filled 2022-10-12 (×2): qty 2

## 2022-10-12 MED ORDER — UBROGEPANT 100 MG PO TABS: 100.0000 mg | ORAL_TABLET | Freq: Every day | ORAL | Status: DC | PRN

## 2022-10-12 MED ORDER — ONDANSETRON HCL 4 MG PO TABS: 4.0000 mg | ORAL_TABLET | Freq: Four times a day (QID) | ORAL | Status: DC | PRN

## 2022-10-12 MED ORDER — IOHEXOL 300 MG/ML  SOLN
100.0000 mL | Freq: Once | INTRAMUSCULAR | Status: AC | PRN
  Administered 2022-10-12: 100 mL via INTRAVENOUS

## 2022-10-12 MED ORDER — SODIUM CHLORIDE 0.9 % IV SOLN
750.0000 mg | Freq: Two times a day (BID) | INTRAVENOUS | Status: DC
  Administered 2022-10-12 – 2022-10-14 (×4): 750 mg via INTRAVENOUS
  Filled 2022-10-12 (×6): qty 7.5

## 2022-10-12 MED ORDER — FAMOTIDINE IN NACL 20-0.9 MG/50ML-% IV SOLN
20.0000 mg | Freq: Once | INTRAVENOUS | Status: AC
  Administered 2022-10-12: 20 mg via INTRAVENOUS
  Filled 2022-10-12: qty 50

## 2022-10-12 MED ORDER — ONDANSETRON HCL 4 MG/2ML IJ SOLN
4.0000 mg | Freq: Four times a day (QID) | INTRAMUSCULAR | Status: DC | PRN
  Administered 2022-10-13 (×2): 4 mg via INTRAVENOUS
  Filled 2022-10-12: qty 2

## 2022-10-12 MED ORDER — PANCRELIPASE (LIP-PROT-AMYL) 36000-114000 UNITS PO CPEP
72000.0000 [IU] | ORAL_CAPSULE | Freq: Three times a day (TID) | ORAL | Status: DC
  Administered 2022-10-14 (×2): 72000 [IU] via ORAL
  Filled 2022-10-12 (×2): qty 2

## 2022-10-12 MED ORDER — HALOPERIDOL LACTATE 5 MG/ML IJ SOLN
2.0000 mg | Freq: Once | INTRAMUSCULAR | Status: AC
  Administered 2022-10-12: 2 mg via INTRAVENOUS
  Filled 2022-10-12: qty 1

## 2022-10-12 MED ORDER — INSULIN ASPART 100 UNIT/ML IJ SOLN
0.0000 [IU] | INTRAMUSCULAR | Status: DC
  Administered 2022-10-12 – 2022-10-14 (×4): 2 [IU] via SUBCUTANEOUS

## 2022-10-12 MED ORDER — SIMVASTATIN 20 MG PO TABS: 10.0000 mg | ORAL_TABLET | Freq: Every day | ORAL | Status: DC

## 2022-10-12 MED ORDER — MORPHINE SULFATE (PF) 2 MG/ML IV SOLN
2.0000 mg | INTRAVENOUS | Status: DC | PRN
  Administered 2022-10-13 (×4): 2 mg via INTRAVENOUS
  Filled 2022-10-12 (×4): qty 1

## 2022-10-12 MED ORDER — POTASSIUM CHLORIDE CRYS ER 20 MEQ PO TBCR
40.0000 meq | EXTENDED_RELEASE_TABLET | Freq: Once | ORAL | Status: AC
  Administered 2022-10-12: 40 meq via ORAL
  Filled 2022-10-12: qty 2

## 2022-10-12 MED ORDER — SODIUM CHLORIDE 0.9 % IV SOLN: INTRAVENOUS | Status: DC

## 2022-10-12 NOTE — H&P (Incomplete)
History and Physical    Patient: Dennis Yang KZS:[id number] DOB: 10/17/1986 DOA: 10/12/2022 DOS: the patient was seen and examined on 10/12/2022 PCP: Tacy Learn, FNP  Patient coming from: Home  Chief Complaint:  Chief Complaint  Patient presents with  . Chest Pain  . Abdominal Pain   HPI: Dennis Yang is a 36 y.o. 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  Pain was aggravated with laying down and moving, it was associated with nausea without vomiting.  He endorsed prior history of abdominal surgery including cholecystectomy, pancreatic islet transplant.  He denies chest pain, shortness of breath, fever, chills.  ED Course:  In the emergency department, patient was hemodynamically stable.  Up in the ED showed WBC 10.7, hemoglobin 10.9, hematocrit 31.4, platelets 120.  BMP showed sodium 140, potassium 3.2, chloride 110, bicarb 21, glucose 221, BUN 26, creatinine 1.23, calcium 8.3, albumin 3.6, AST 35, ALT 36, ALP 104.  Troponin x2 was flat at < 2, urinalysis showed glycosuria, but was unimpressive for UTI.  Lipase 20. Abdominal x-ray showed findings consistent with partial small bowel obstruction CT abdomen and pelvis with contrast was suggestive of possible partial small bowel obstruction, possibly due to adhesions. Chest x-ray showed no active disease. She was treated with Pepcid, morphine, Haldol.  Potassium was replenished, IV hydration was provided  Review of Systems: Review of systems as noted in the HPI. All other systems reviewed and are negative.   Past Medical History:  Diagnosis Date  . Anxiety   . Back pain   . Bipolar 1 disorder (HCC)   . Depression   . Diabetes mellitus without  complication (HCC)   . Incontinence of bowel   . Incontinence of urine   . Neck pain   . Neuropathy   . Osteoarthritis   . Osteonecrosis (HCC)    left foot  . Pancreatitis   . Schizoaffective disorder (HCC)   . TIA (transient ischemic attack)    Past Surgical History:  Procedure Laterality Date  . botox injections in bladder    . CHOLECYSTECTOMY    . COMBINED KIDNEY-PANCREAS TRANSPLANT     not kidneys  . ELBOW FRACTURE SURGERY    . FOOT SURGERY    . HERNIA REPAIR    . LITHOTRIPSY    . NASAL SINUS SURGERY      Social History:  reports that he has quit smoking. He has never used smokeless tobacco. He reports that he does not currently use alcohol. He reports that he does not currently use drugs.   Allergies  Allergen Reactions  . Ciprofloxacin Hives    Family history: No pertinent family history ***  Prior to Admission medications   Medication Sig Start Date End Date Taking? Authorizing Provider  acetaminophen (TYLENOL) 325 MG tablet Take 2 tablets (650 mg total) by mouth every 6 (six) hours as needed for mild pain, fever or headache (or Fever >/= 101). 08/13/19   Emokpae, Courage, MD  anagrelide (AGRYLIN) 1 MG capsule Take 1 mg by mouth in the morning and at bedtime. 05/27/20   [provider]  ARIPiprazole (ABILIFY) 20 MG tablet Take 20 mg by mouth at bedtime. 07/11/21   [provider]  ATROVENT HFA 17 MCG/ACT inhaler Inhale 1 puff into the lungs every 4 (  four) hours as needed for wheezing.  01/16/19   [provider]  cetirizine (ZYRTEC) 10 MG tablet Take 10 mg by mouth daily. 11/16/19   [provider]  chlorhexidine (HIBICLENS) 4 % external liquid Apply 1 application topically daily as needed (for skin irritation as directed).  01/13/20   [provider]  Cholecalciferol (VITAMIN D3) 1.25 MG (50000 UT) CAPS Take 1 capsule by mouth every Monday, Wednesday, and Friday. 3 times a week 12/24/18   [provider]  CREON 36000  units CPEP capsule Take 36,000-72,000 Units by mouth See admin instructions. Take 2 capsules by mouth with meals and 1 capsule with snacks 11/16/19   [provider]  cyclobenzaprine (FLEXERIL) 10 MG tablet Take 10 mg by mouth 3 (three) times daily as needed for muscle spasms. 04/19/20   [provider]  diclofenac Sodium (VOLTAREN) 1 % GEL Apply 2 g topically daily as needed (for pain).  11/23/19   [provider]  EMGALITY 120 MG/ML SOAJ Inject 1 Dose into the muscle every 30 (thirty) days.  11/24/18   [provider]  fluticasone (FLONASE) 50 MCG/ACT nasal spray Place 1 spray into both nostrils daily.  01/20/19   [provider]  fluticasone-salmeterol (ADVAIR) 250-50 MCG/ACT AEPB Inhale 1 puff into the lungs 2 (two) times daily. 07/11/21   [provider]  HUMALOG KWIKPEN 100 UNIT/ML KwikPen Inject 16 Units into the skin 3 (three) times daily before meals. 03/20/21   [provider]  hydrOXYzine (ATARAX/VISTARIL) 25 MG tablet Take 25 mg by mouth 4 (four) times daily. 07/11/21   [provider]  levETIRAcetam (KEPPRA) 750 MG tablet Take 750 mg by mouth in the morning and at bedtime.    [provider]  lidocaine (XYLOCAINE) 4 % external solution Apply topically daily as needed for mild pain or moderate pain.  11/23/19   [provider]  linaclotide (LINZESS) 72 MCG capsule Take 72 mcg by mouth daily. 10/14/20   [provider]  meclizine (ANTIVERT) 25 MG tablet Take 25 mg by mouth every 6 (six) hours as needed for dizziness or nausea.  10/01/19   [provider]  meloxicam (MOBIC) 15 MG tablet Take 15 mg by mouth daily. 07/13/20   [provider]  metaxalone (SKELAXIN) 800 MG tablet Take 800 mg by mouth every 8 (eight) hours as needed for muscle spasms.  12/03/19   [provider]  metoCLOPramide (REGLAN) 10 MG tablet Take 2 tablets (20 mg total) by mouth every 6 (six) hours as needed  for nausea, vomiting or refractory nausea / vomiting. 07/21/22   Al DecantGroce, Christopher F, PA-C  montelukast (SINGULAIR) 10 MG tablet Take 10 mg by mouth daily.  01/12/19   [provider]  nitroGLYCERIN (NITROSTAT) 0.4 MG SL tablet Place 0.4 mg under the tongue every 5 (five) minutes as needed for chest pain.  08/18/19   [provider]  omeprazole (PRILOSEC) 40 MG capsule Take 1 capsule (40 mg total) by mouth daily. 12/19/19   Shon HaleEmokpae, Courage, MD  ondansetron (ZOFRAN-ODT) 8 MG disintegrating tablet Take 1 tablet (8 mg total) by mouth 3 (three) times daily before meals. 07/18/21   Johnson, Clanford L, MD  prazosin (MINIPRESS) 5 MG capsule Take 5 mg by mouth at bedtime. 06/30/21   [provider]  pregabalin (LYRICA) 200 MG capsule Take 200 mg by mouth 2 (two) times daily.    [provider]  propranolol (INDERAL) 40 MG tablet Take 40 mg by  mouth 2 (two) times daily.  01/12/19   [provider]  simvastatin (ZOCOR) 20 MG tablet Take 10 mg by mouth daily at 6 PM.  12/25/18   [provider]  sucralfate (CARAFATE) 1 g tablet Take 1 g by mouth 3 (three) times daily. 07/11/21   [provider]  tamsulosin (FLOMAX) 0.4 MG CAPS capsule Take 0.4 mg by mouth daily. 02/02/22   [provider]  topiramate (TOPAMAX) 100 MG tablet Take 100-200 mg by mouth 3 (three) times daily. Take 100 mg in the morning and at noon. Take 200 mg at bedtime 05/18/19   [provider]  TRINTELLIX 20 MG TABS tablet Take 20 mg by mouth daily. 01/12/22   [provider]  UBRELVY 100 MG TABS Take 100 mg by mouth daily as needed (migraines). 07/11/21   [provider]  Vibegron (GEMTESA) 75 MG TABS Take 75 mg by mouth daily.    [provider]  VRAYLAR 3 MG capsule Take 3 mg by mouth daily. 02/01/22   [provider]  zolpidem (AMBIEN) 5 MG tablet Take 5 mg by mouth at bedtime as needed for sleep. 07/11/21   [provider]    Physical  Exam: BP 117/76   Pulse 69   Temp 97.6 F (36.4 C) (Oral)   Resp 14   Ht 6' (1.829 m)   Wt 79.4 kg   SpO2 100%   BMI 23.73 kg/m   General: 36 y.o. year-old male ill appearing, but in no acute distress.  Alert and oriented x3. HEENT: NCAT, EOMI, dry mucous membrane Neck: Supple, trachea medial Cardiovascular: Regular rate and rhythm with no rubs or gallops.  No thyromegaly or JVD noted.  No lower extremity edema. 2/4 pulses in all 4 extremities. Respiratory: Clear to auscultation with no wheezes or rales. Good inspiratory effort. Abdomen: Soft, tender to palpation of epigastrium and periumbilical area.  Decreased bowel sounds x4 quadrants. Muskuloskeletal: No cyanosis, clubbing or edema noted bilaterally Neuro: CN II-XII intact, sensation, reflexes intact Skin: No ulcerative lesions noted or rashes Psychiatry: Judgement and insight appear normal. Mood is appropriate for condition and setting          Labs on Admission:  Basic Metabolic Panel: Recent Labs  Lab 10/12/22 1739  NA 140  K 3.2*  CL 113*  CO2 21*  GLUCOSE 221*  BUN 26*  CREATININE 1.23  CALCIUM 8.3*   Liver Function Tests: Recent Labs  Lab 10/12/22 1739  AST 35  ALT 36  ALKPHOS 104  BILITOT 0.3  PROT 6.0*  ALBUMIN 3.6   Recent Labs  Lab 10/12/22 1739  LIPASE 20   No results for input(s): "AMMONIA" in the last 168 hours. CBC: Recent Labs  Lab 10/12/22 1739  WBC 18.7*  HGB 10.9*  HCT 31.4*  MCV 93.2  PLT 120*   Cardiac Enzymes: No results for input(s): "CKTOTAL", "CKMB", "CKMBINDEX", "TROPONINI" in the last 168 hours.  BNP (last 3 results) No results for input(s): "BNP" in the last 8760 hours.  ProBNP (last 3 results) No results for input(s): "PROBNP" in the last 8760 hours.  CBG: Recent Labs  Lab 10/12/22 1726  GLUCAP 191*    Radiological Exams on Admission: DG Abd Portable 1 View  Result Date: 10/12/2022 CLINICAL DATA:  Evaluate nasogastric tube placement. EXAM: PORTABLE  ABDOMEN - 1 VIEW COMPARISON:  April 05, 2019 FINDINGS: A nasogastric tube is seen with its distal tip overlying the expected region of the gastric fundus. Its  distal side hole sits approximately 7.3 cm distal to the expected region of the gastroesophageal junction. Dilated loops of air-filled small bowel are seen within the upper abdomen. A large amount of stool is seen throughout the colon. Radiopaque surgical clips are seen overlying the right upper quadrant. No radio-opaque calculi or other significant radiographic abnormality are seen. IMPRESSION: 1. Nasogastric tube positioning, as described above. 2. Findings consistent with the partial small bowel obstruction seen on the prior abdomen and pelvis CT (dated October 12, 2022). Electronically Signed   By: Aram Candela M.D.   On: 10/12/2022 21:14   CT ABDOMEN PELVIS W CONTRAST  Result Date: 10/12/2022 CLINICAL DATA:  Epigastric pain, nausea, chest pain EXAM: CT ABDOMEN AND PELVIS WITH CONTRAST TECHNIQUE: Multidetector CT imaging of the abdomen and pelvis was performed using the standard protocol following bolus administration of intravenous contrast. RADIATION DOSE REDUCTION: This exam was performed according to the departmental dose-optimization program which includes automated exposure control, adjustment of the mA and/or kV according to patient size and/or use of iterative reconstruction technique. CONTRAST:  OMNIPAQUE IOHEXOL 300 MG/ML  SOLN COMPARISON:  07/21/2022 FINDINGS: Lower chest: Small linear densities in the posterior lower lung fields may suggest minimal subsegmental atelectasis. Tip of central venous catheter is seen in the right atrium. Hepatobiliary: No focal abnormalities are seen in liver. Surgical clips are seen in gallbladder fossa. Pancreas: Pancreas is not distinctly seen. Clinical history suggests previous pancreatic transplant. Spleen: Spleen is not seen. Adrenals/Urinary Tract: Adrenals are unremarkable. There is no  hydronephrosis. There are bilateral renal stones largest measuring 8 mm in size in the lower pole of left kidney. There is no perinephric fluid collection. Ureters are not dilated. Urinary bladder is unremarkable. Stomach/Bowel: Surgical staples are seen in stomach. There is dilation of proximal small bowel loops with proximal jejunum measuring 4 cm in diameter. There is fluid in the lumen of dilated proximal small bowel loops. Distal small bowel loops are decompressed. Exact level of transition is not clearly identified. There is mild fecalization in distal small bowel loops in lower mid abdomen. Appendix is not dilated. There is no significant wall thickening in colon. There is no pericolic stranding. Vascular/Lymphatic: Vascular structures are unremarkable. There are few subcentimeter nodes in mesentery and retroperitoneum. This may suggest benign reactive hyperplasia. Reproductive: Unremarkable. Other: There is no ascites or pneumoperitoneum. Small umbilical hernia containing fat is seen. Musculoskeletal: Schmorl's node is seen in the upper endplate of body of L2 vertebra with no significant change. IMPRESSION: There is dilation of proximal small bowel loops measuring up to 4 cm in diameter. Distal and terminal ileum appear to be decompressed. Findings suggest possible partial small bowel obstruction, possibly due to adhesions. Level of transition is possibly in the lower mid abdomen. There is no hydronephrosis.  Bilateral nonobstructing renal stones. Pancreas is not distinctly visualized. There are no abnormal fluid collections in pancreatic bed. Other findings as described in the body of the report. Electronically Signed   By: Ernie Avena M.D.   On: 10/12/2022 19:36   DG Chest 1 View  Result Date: 10/12/2022 CLINICAL DATA:  Epigastric/abdominal pain. EXAM: CHEST  1 VIEW COMPARISON:  Chest radiograph dated February 23, 2022. FINDINGS: The heart size and mediastinal contours are within normal limits.  Both lungs are clear. The visualized skeletal structures are unremarkable. Right IJ access Port-A-Cath with distal tip in the SVC, unchanged. IMPRESSION: No active disease. Electronically Signed   By: Larose Hires D.O.   On: 10/12/2022 18:00  EKG: I independently viewed the EKG done and my findings are as followed: Normal sinus rhythm at a rate of 94 bpm  Assessment/Plan Present on Admission: . Partial small bowel obstruction (HCC)  Principal Problem:   Partial small bowel obstruction (HCC)   Nausea, abdominal pain secondary to partial small bowel obstruction Continue NG tube  Continue NPO at this time with plan to advance diet as tolerated Continue IV hydration Continue IV morphine 2 mg q.4h p.r.n. for moderate/severe pain Continue Protonix Continue Zofran p.r.n. for nausea/vomiting General surgery was consulted and will follow-up with patient in the morning by EDP   Hypokalemia K+ 3.2, this was replenished  Dehydration Continue IV hydration  Chronic leukocytosis WBC 18.7, continue to monitor WBC with morning labs  Chronic thrombocytopenia Platelets 120, continue to monitor platelet levels with morning labs  Seizure Continue on Keppra  GERD Continue Protonix  T2DM with uncontrolled hyperglycemia Continue ISS and hypoglycemic protocol  Bipolar 1 disorder/BiPolar disorder/depression/PTSD/anxiety Continue Abilify  Auto islet cell transplant-insulin-dependent Continue Creon   Hyperlipidemia Continue Zocor  History of migraine Continue ubrogepant  DVT prophylaxis: SCDs (consider starting chemoprophylaxis if no indication for surgical intervention)  Code Status: Full code  Consults: General surgery  Family Communication: Significant other at bedside (all questions answered to satisfaction)  Severity of Illness: The appropriate patient status for this patient is INPATIENT. Inpatient status is judged to be reasonable and necessary in order to provide the  required intensity of service to ensure the patient's safety. The patient's presenting symptoms, physical exam findings, and initial radiographic and laboratory data in the context of their chronic comorbidities is felt to place them at high risk for further clinical deterioration. Furthermore, it is not anticipated that the patient will be medically stable for discharge from the hospital within 2 midnights of admission.   * I certify that at the point of admission it is my clinical judgment that the patient will require inpatient hospital care spanning beyond 2 midnights from the point of admission due to high intensity of service, high risk for further deterioration and high frequency of surveillance required.*  Author: Frankey Shown, DO 10/12/2022 8:42 PM  For on call review [email or web address].

## 2022-10-12 NOTE — ED Triage Notes (Addendum)
Pt to ED c/o epigastric pain/ chest pain that started this morning. Worse with fluid intake. Transplant recipient. Instructed to come to ED by GI MD.

## 2022-10-12 NOTE — ED Provider Notes (Signed)
[facility] EMERGENCY DEPARTMENT Provider Note   CSN: [id number] Arrival date & time: 10/12/22  1653     History {Add pertinent medical, surgical, social history, OB history to HPI:1} Chief Complaint  Patient presents with   Chest Pain   Abdominal Pain    Dennis Yang is a 36 y.o. male.  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  Patient states today he experienced sudden onset of severe epigastric abdominal pain describes it as sharp and stabbing.  Does radiate to his back.  Says that moving and laying down worsens it.  Nausea but no vomiting.  No fevers.  No diarrhea.  Uses medical marijuana every other day.  Has had multiple abdominal surgeries including partial bowel resection, cholecystectomy, and pancreatic islet transplant.  Denies any recent alcohol use.       Home Medications Prior to Admission medications   Medication Sig Start Date End Date Taking? Authorizing Provider  acetaminophen (TYLENOL) 325 MG tablet Take 2 tablets (650 mg total) by mouth every 6 (six) hours as needed for mild pain, fever or headache (or Fever >/= 101). 08/13/19   Emokpae, Courage, MD  anagrelide (AGRYLIN) 1 MG capsule Take 1 mg by mouth in the morning and at bedtime. 05/27/20   [provider]  ARIPiprazole (ABILIFY) 20 MG tablet Take 20 mg by mouth at bedtime. 07/11/21   [provider]  ATROVENT HFA 17 MCG/ACT inhaler Inhale 1 puff into the lungs every 4 (four) hours as needed for wheezing.  01/16/19   [provider]  cetirizine (ZYRTEC) 10 MG tablet Take 10 mg by mouth daily. 11/16/19   [provider]  chlorhexidine (HIBICLENS) 4 % external liquid Apply 1 application topically daily as needed (for skin irritation as directed).  01/13/20   [provider]  Cholecalciferol (VITAMIN D3) 1.25 MG (50000 UT) CAPS Take 1 capsule by mouth every Monday, Wednesday, and Friday. 3 times a week 12/24/18   [provider]  CREON 36000 units CPEP capsule Take 36,000-72,000 Units by mouth See admin instructions. Take 2 capsules by mouth with meals and 1 capsule with snacks 11/16/19   [provider]  cyclobenzaprine (FLEXERIL) 10 MG tablet Take 10 mg by mouth 3 (three) times daily as needed for muscle spasms. 04/19/20   [provider]  diclofenac Sodium (VOLTAREN) 1 % GEL Apply 2 g topically daily as needed (for pain).  11/23/19   [provider]  EMGALITY 120 MG/ML SOAJ Inject 1 Dose into the muscle every 30 (thirty) days.  11/24/18   [provider]  fluticasone (FLONASE) 50 MCG/ACT nasal spray Place 1 spray into both nostrils daily.  01/20/19   [provider]  fluticasone-salmeterol (ADVAIR) 250-50 MCG/ACT AEPB Inhale 1 puff into the lungs 2 (two) times daily. 07/11/21   [provider]  HUMALOG KWIKPEN 100 UNIT/ML KwikPen Inject 16 Units into the skin 3 (three) times daily before meals. 03/20/21   [provider]  hydrOXYzine (ATARAX/VISTARIL) 25 MG tablet Take 25 mg by mouth 4 (four) times daily. 07/11/21   [provider]  levETIRAcetam (KEPPRA) 750 MG tablet Take 750 mg by mouth in the morning and at bedtime.    [provider]  lidocaine (XYLOCAINE) 4 % external solution Apply topically daily as needed for mild pain or moderate pain.  11/23/19  [provider]  linaclotide (LINZESS) 72 MCG capsule Take 72 mcg by mouth daily. 10/14/20   [provider]  meclizine (ANTIVERT) 25 MG tablet Take 25 mg by mouth every 6 (six) hours as needed for dizziness or nausea.  10/01/19   [provider]  meloxicam (MOBIC) 15 MG tablet Take 15 mg by mouth daily. 07/13/20   [provider]  metaxalone (SKELAXIN) 800 MG tablet Take 800 mg by mouth  every 8 (eight) hours as needed for muscle spasms.  12/03/19   [provider]  metoCLOPramide (REGLAN) 10 MG tablet Take 2 tablets (20 mg total) by mouth every 6 (six) hours as needed for nausea, vomiting or refractory nausea / vomiting. 07/21/22   Azucena Cecil, PA-C  montelukast (SINGULAIR) 10 MG tablet Take 10 mg by mouth daily.  01/12/19   [provider]  nitroGLYCERIN (NITROSTAT) 0.4 MG SL tablet Place 0.4 mg under the tongue every 5 (five) minutes as needed for chest pain.  08/18/19   [provider]  omeprazole (PRILOSEC) 40 MG capsule Take 1 capsule (40 mg total) by mouth daily. 12/19/19   Roxan Hockey, MD  ondansetron (ZOFRAN-ODT) 8 MG disintegrating tablet Take 1 tablet (8 mg total) by mouth 3 (three) times daily before meals. 07/18/21   Johnson, Clanford L, MD  prazosin (MINIPRESS) 5 MG capsule Take 5 mg by mouth at bedtime. 06/30/21   [provider]  pregabalin (LYRICA) 200 MG capsule Take 200 mg by mouth 2 (two) times daily.    [provider]  propranolol (INDERAL) 40 MG tablet Take 40 mg by mouth 2 (two) times daily.  01/12/19   [provider]  simvastatin (ZOCOR) 20 MG tablet Take 10 mg by mouth daily at 6 PM.  12/25/18   [provider]  sucralfate (CARAFATE) 1 g tablet Take 1 g by mouth 3 (three) times daily. 07/11/21   [provider]  tamsulosin (FLOMAX) 0.4 MG CAPS capsule Take 0.4 mg by mouth daily. 02/02/22   [provider]  topiramate (TOPAMAX) 100 MG tablet Take 100-200 mg by mouth 3 (three) times daily. Take 100 mg in the morning and at noon. Take 200 mg at bedtime 05/18/19   [provider]  TRINTELLIX 20 MG TABS tablet Take 20 mg by mouth daily. 01/12/22   [provider]  UBRELVY 100 MG TABS Take 100 mg by mouth daily as needed (migraines). 07/11/21   [provider]  Vibegron (GEMTESA) 75 MG TABS Take 75 mg by mouth daily.    [provider]  VRAYLAR 3 MG  capsule Take 3 mg by mouth daily. 02/01/22   [provider]  zolpidem (AMBIEN) 5 MG tablet Take 5 mg by mouth at bedtime as needed for sleep. 07/11/21   [provider]      Allergies    Ciprofloxacin    Review of Systems   Review of Systems  Physical Exam Updated Vital Signs BP 124/81   Pulse 92   Temp 97.9 F (36.6 C) (Oral)   Resp 18   Ht 6' (1.829 m)   Wt 79.4 kg   SpO2 100%   BMI 23.73 kg/m  Physical Exam Vitals and nursing note reviewed.  Constitutional:      General: He is not in acute distress.    Appearance: He is well-developed.     Comments: Uncomfortable appearing  HENT:     Head: Normocephalic and atraumatic.     Right Ear:  External ear normal.     Left Ear: External ear normal.     Nose: Nose normal.  Eyes:     Extraocular Movements: Extraocular movements intact.     Conjunctiva/sclera: Conjunctivae normal.     Pupils: Pupils are equal, round, and reactive to light.  Cardiovascular:     Rate and Rhythm: Normal rate and regular rhythm.  Pulmonary:     Effort: Pulmonary effort is normal. No respiratory distress.  Abdominal:     General: There is no distension.     Palpations: Abdomen is soft. There is no mass.     Tenderness: There is abdominal tenderness (Epigastrium). There is no guarding.     Comments: Surgical scars present in abdomen from prior surgeries  Musculoskeletal:        General: No swelling.     Cervical back: Normal range of motion and neck supple.     Right lower leg: No edema.     Left lower leg: No edema.  Skin:    General: Skin is warm and dry.     Capillary Refill: Capillary refill takes 2 to 3 seconds.  Neurological:     Mental Status: He is alert. Mental status is at baseline.  Psychiatric:        Mood and Affect: Mood normal.        Behavior: Behavior normal.     ED Results / Procedures / Treatments   Labs (all labs ordered are listed, but only abnormal results are displayed) Labs Reviewed  BASIC  METABOLIC PANEL  CBC  TROPONIN I (HIGH SENSITIVITY)    EKG None  Radiology No results found.  Procedures Procedures   Medications Ordered in ED Medications - No data to display  ED Course/ Medical Decision Making/ A&P                           Medical Decision Making Amount and/or Complexity of Data Reviewed Labs: ordered.  Risk Prescription drug management.   Dennis Yang is a 36 y.o. 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  This patient presents to the ED for concern of complaints listed in HPI, this involves an extensive number of treatment options, and is a complaint that carries with it a high risk of complications and morbidity. Disposition including potential need for admission considered.   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  Also considering gastroparesis which may be exacerbated by the patient's frequent marijuana use.  Given his multiple abdominal surgeries also at risk for bowel obstruction.  Considered MI and cardiopulmonary pathology but given that the patient's symptoms appear more epigastric feel that this is less likely.  Plan:  Labs Lipase Urinalysis Troponin EKG Chest x-ray CT abdomen pelvis with IV contrast Pain and nausea medication  ED Summary/Re-evaluation:  ***  Dispo: {Disposition:28069}   Additional history obtained from {Additional History:28067} Records reviewed {Records Reviewed:28068} The following labs were independently interpreted: {labs  interpreted:28064} and show {lab findings:28250} I independently reviewed the following imaging with scope of interpretation limited to determining acute life threatening conditions related to  emergency care: {imaging interpreted:28065}, which revealed {No acute abnormality:28066}  I personally reviewed and interpreted cardiac monitoring: {cardiac monitoring:28251} I personally reviewed and interpreted the pt's EKG: see above for interpretation  I have reviewed the patients home medications and made adjustments as needed Consults: {Consultants:28063} Social Determinants of health:  ***  Final Clinical Impression(s) / ED Diagnoses Final diagnoses:  None    Rx / DC Orders ED Discharge Orders     None

## 2022-10-12 NOTE — H&P (Incomplete)
History and Physical    Patient: Dennis Yang [id number] DOB: 11/21/1986 DOA: 10/12/2022 DOS: the patient was seen and examined on 10/13/2022 PCP: Beola Cord, [place]  Patient coming from: Home  Chief Complaint:  Chief Complaint  Patient presents with   Chest Pain   Abdominal Pain   HPI: Dennis Yang is a 36 y.o. 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  Pain was aggravated with laying down and moving, it was associated with nausea without vomiting.  He endorsed prior history of abdominal surgery including cholecystectomy, pancreatic islet transplant.  He denies chest pain, shortness of breath, fever, chills.  ED Course:  In the emergency department, patient was hemodynamically stable.  Up in the ED showed WBC 10.7, hemoglobin 10.9, hematocrit 31.4, platelets 120.  BMP showed sodium 140, potassium 3.2, chloride 110, bicarb 21, glucose 221, BUN 26, creatinine 1.23, calcium 8.3, albumin 3.6, AST 35, ALT 36, ALP 104.  Troponin x2 was flat at < 2, urinalysis showed glycosuria, but was unimpressive for UTI.  Lipase 20. Abdominal x-ray showed findings consistent with partial small bowel obstruction CT abdomen and pelvis with contrast was suggestive of possible partial small bowel obstruction, possibly due to adhesions. Chest x-ray showed no active disease. She was treated with Pepcid, morphine, Haldol.  Potassium was replenished, IV hydration was provided  Review of Systems: Review of systems as noted in the HPI. All other systems reviewed and are negative.   Past Medical History:  Diagnosis Date   Anxiety    Back pain    Bipolar 1 disorder ([place])    Depression    Diabetes mellitus without complication  (HCC)    Incontinence of bowel    Incontinence of urine    Neck pain    Neuropathy    Osteoarthritis    Osteonecrosis (HCC)    left foot   Pancreatitis    Schizoaffective disorder (HCC)    TIA (transient ischemic attack)    Past Surgical History:  Procedure Laterality Date   botox injections in bladder     CHOLECYSTECTOMY     COMBINED KIDNEY-PANCREAS TRANSPLANT     not kidneys   ELBOW FRACTURE SURGERY     FOOT SURGERY     HERNIA REPAIR     LITHOTRIPSY     NASAL SINUS SURGERY      Social History:  reports that he has quit smoking. He has never used smokeless tobacco. He reports that he does not currently use alcohol. He reports that he does not currently use drugs.   Allergies  Allergen Reactions   Ciprofloxacin Hives    Family history: No pertinent family history   Prior to Admission medications   Medication Sig Start Date End Date Taking? Authorizing Provider  acetaminophen (TYLENOL) 325 MG tablet Take 2 tablets (650 mg total) by mouth every 6 (six) hours as needed for mild pain, fever or headache (or Fever >/= 101). 08/13/19   Emokpae, Courage, MD  anagrelide (AGRYLIN) 1 MG capsule Take 1 mg by mouth in the morning and at bedtime. 05/27/20   [provider]  ARIPiprazole (ABILIFY) 20 MG tablet Take 20 mg by mouth at bedtime. 07/11/21   [provider]  ATROVENT HFA 17 MCG/ACT inhaler Inhale 1 puff into the lungs every 4 (  four) hours as needed for wheezing.  01/16/19   [provider]  cetirizine (ZYRTEC) 10 MG tablet Take 10 mg by mouth daily. 11/16/19   [provider]  chlorhexidine (HIBICLENS) 4 % external liquid Apply 1 application topically daily as needed (for skin irritation as directed).  01/13/20   [provider]  Cholecalciferol (VITAMIN D3) 1.25 MG (50000 UT) CAPS Take 1 capsule by mouth every Monday, Wednesday, and Friday. 3 times a week 12/24/18   [provider]  CREON 36000 units CPEP capsule Take 36,000-72,000  Units by mouth See admin instructions. Take 2 capsules by mouth with meals and 1 capsule with snacks 11/16/19   [provider]  cyclobenzaprine (FLEXERIL) 10 MG tablet Take 10 mg by mouth 3 (three) times daily as needed for muscle spasms. 04/19/20   [provider]  diclofenac Sodium (VOLTAREN) 1 % GEL Apply 2 g topically daily as needed (for pain).  11/23/19   [provider]  EMGALITY 120 MG/ML SOAJ Inject 1 Dose into the muscle every 30 (thirty) days.  11/24/18   [provider]  fluticasone (FLONASE) 50 MCG/ACT nasal spray Place 1 spray into both nostrils daily.  01/20/19   [provider]  fluticasone-salmeterol (ADVAIR) 250-50 MCG/ACT AEPB Inhale 1 puff into the lungs 2 (two) times daily. 07/11/21   [provider]  HUMALOG KWIKPEN 100 UNIT/ML KwikPen Inject 16 Units into the skin 3 (three) times daily before meals. 03/20/21   [provider]  hydrOXYzine (ATARAX/VISTARIL) 25 MG tablet Take 25 mg by mouth 4 (four) times daily. 07/11/21   [provider]  levETIRAcetam (KEPPRA) 750 MG tablet Take 750 mg by mouth in the morning and at bedtime.    [provider]  lidocaine (XYLOCAINE) 4 % external solution Apply topically daily as needed for mild pain or moderate pain.  11/23/19   [provider]  linaclotide (LINZESS) 72 MCG capsule Take 72 mcg by mouth daily. 10/14/20   [provider]  meclizine (ANTIVERT) 25 MG tablet Take 25 mg by mouth every 6 (six) hours as needed for dizziness or nausea.  10/01/19   [provider]  meloxicam (MOBIC) 15 MG tablet Take 15 mg by mouth daily. 07/13/20   [provider]  metaxalone (SKELAXIN) 800 MG tablet Take 800 mg by mouth every 8 (eight) hours as needed for muscle spasms.  12/03/19   [provider]  metoCLOPramide (REGLAN) 10 MG tablet Take 2 tablets (20 mg total) by mouth every 6 (six) hours as needed for nausea, vomiting or refractory  nausea / vomiting. 07/21/22   Al DecantGroce, Christopher F, PA-C  montelukast (SINGULAIR) 10 MG tablet Take 10 mg by mouth daily.  01/12/19   [provider]  nitroGLYCERIN (NITROSTAT) 0.4 MG SL tablet Place 0.4 mg under the tongue every 5 (five) minutes as needed for chest pain.  08/18/19   [provider]  omeprazole (PRILOSEC) 40 MG capsule Take 1 capsule (40 mg total) by mouth daily. 12/19/19   Shon HaleEmokpae, Courage, MD  ondansetron (ZOFRAN-ODT) 8 MG disintegrating tablet Take 1 tablet (8 mg total) by mouth 3 (three) times daily before meals. 07/18/21   Johnson, Clanford L, MD  prazosin (MINIPRESS) 5 MG capsule Take 5 mg by mouth at bedtime. 06/30/21   [provider]  pregabalin (LYRICA) 200 MG capsule Take 200 mg by mouth 2 (two) times daily.    [provider]  propranolol (INDERAL) 40 MG tablet Take 40 mg by  mouth 2 (two) times daily.  01/12/19   [provider]  simvastatin (ZOCOR) 20 MG tablet Take 10 mg by mouth daily at 6 PM.  12/25/18   [provider]  sucralfate (CARAFATE) 1 g tablet Take 1 g by mouth 3 (three) times daily. 07/11/21   [provider]  tamsulosin (FLOMAX) 0.4 MG CAPS capsule Take 0.4 mg by mouth daily. 02/02/22   [provider]  topiramate (TOPAMAX) 100 MG tablet Take 100-200 mg by mouth 3 (three) times daily. Take 100 mg in the morning and at noon. Take 200 mg at bedtime 05/18/19   [provider]  TRINTELLIX 20 MG TABS tablet Take 20 mg by mouth daily. 01/12/22   [provider]  UBRELVY 100 MG TABS Take 100 mg by mouth daily as needed (migraines). 07/11/21   [provider]  Vibegron (GEMTESA) 75 MG TABS Take 75 mg by mouth daily.    [provider]  VRAYLAR 3 MG capsule Take 3 mg by mouth daily. 02/01/22   [provider]  zolpidem (AMBIEN) 5 MG tablet Take 5 mg by mouth at bedtime as needed for sleep. 07/11/21   [provider]    Physical Exam: BP 97/67 (BP Location: Left  Arm)   Pulse 77   Temp 97.8 F (36.6 C) (Oral)   Resp 18   Ht 6' (1.829 m)   Wt 79.4 kg   SpO2 98%   BMI 23.73 kg/m   General: 36 y.o. year-old male ill appearing, but in no acute distress.  Alert and oriented x3. HEENT: NCAT, EOMI, dry mucous membrane Neck: Supple, trachea medial Cardiovascular: Regular rate and rhythm with no rubs or gallops.  No thyromegaly or JVD noted.  No lower extremity edema. 2/4 pulses in all 4 extremities. Respiratory: Clear to auscultation with no wheezes or rales. Good inspiratory effort. Abdomen: Soft, tender to palpation of epigastrium and periumbilical area.  Decreased bowel sounds x4 quadrants. Muskuloskeletal: No cyanosis, clubbing or edema noted bilaterally Neuro: CN II-XII intact, sensation, reflexes intact Skin: No ulcerative lesions noted or rashes Psychiatry: Judgement and insight appear normal. Mood is appropriate for condition and setting          Labs on Admission:  Basic Metabolic Panel: Recent Labs  Lab 10/12/22 1739  NA 140  K 3.2*  CL 113*  CO2 21*  GLUCOSE 221*  BUN 26*  CREATININE 1.23  CALCIUM 8.3*   Liver Function Tests: Recent Labs  Lab 10/12/22 1739  AST 35  ALT 36  ALKPHOS 104  BILITOT 0.3  PROT 6.0*  ALBUMIN 3.6   Recent Labs  Lab 10/12/22 1739  LIPASE 20   No results for input(s): "AMMONIA" in the last 168 hours. CBC: Recent Labs  Lab 10/12/22 1739 10/13/22 0357  WBC 18.7* 13.8*  HGB 10.9* 10.2*  HCT 31.4* 29.8*  MCV 93.2 94.0  PLT 120* 102*   Cardiac Enzymes: No results for input(s): "CKTOTAL", "CKMB", "CKMBINDEX", "TROPONINI" in the last 168 hours.  BNP (last 3 results) No results for input(s): "BNP" in the last 8760 hours.  ProBNP (last 3 results) No results for input(s): "PROBNP" in the last 8760 hours.  CBG: Recent Labs  Lab 10/12/22 1726 10/12/22 2323 10/13/22 0408  GLUCAP 191* 166* 98    Radiological Exams on Admission: DG Chest Port 1 View  Result Date:  10/12/2022 CLINICAL DATA:  NG tube placement; epigastric and chest pain EXAM: PORTABLE CHEST 1 VIEW COMPARISON:  Radiographs earlier today  FINDINGS: Interval placement of a enteric tube with tip and side-port in the stomach. Remainder unchanged. Accessed right chest wall Port-A-Cath with tip at the superior cavoatrial junction. Normal cardiomediastinal silhouette. No focal consolidation, pleural effusion, or pneumothorax. No acute osseous abnormality. IMPRESSION: Enteric tube tip and side-port in the stomach. Otherwise no change from earlier today. Electronically Signed   By: Minerva Fester M.D.   On: 10/12/2022 21:36   DG Abd Portable 1 View  Result Date: 10/12/2022 CLINICAL DATA:  Evaluate nasogastric tube placement. EXAM: PORTABLE ABDOMEN - 1 VIEW COMPARISON:  April 05, 2019 FINDINGS: A nasogastric tube is seen with its distal tip overlying the expected region of the gastric fundus. Its distal side hole sits approximately 7.3 cm distal to the expected region of the gastroesophageal junction. Dilated loops of air-filled small bowel are seen within the upper abdomen. A large amount of stool is seen throughout the colon. Radiopaque surgical clips are seen overlying the right upper quadrant. No radio-opaque calculi or other significant radiographic abnormality are seen. IMPRESSION: 1. Nasogastric tube positioning, as described above. 2. Findings consistent with the partial small bowel obstruction seen on the prior abdomen and pelvis CT (dated October 12, 2022). Electronically Signed   By: Aram Candela M.D.   On: 10/12/2022 21:14   CT ABDOMEN PELVIS W CONTRAST  Result Date: 10/12/2022 CLINICAL DATA:  Epigastric pain, nausea, chest pain EXAM: CT ABDOMEN AND PELVIS WITH CONTRAST TECHNIQUE: Multidetector CT imaging of the abdomen and pelvis was performed using the standard protocol following bolus administration of intravenous contrast. RADIATION DOSE REDUCTION: This exam was performed according to the  departmental dose-optimization program which includes automated exposure control, adjustment of the mA and/or kV according to patient size and/or use of iterative reconstruction technique. CONTRAST:  OMNIPAQUE IOHEXOL 300 MG/ML  SOLN COMPARISON:  07/21/2022 FINDINGS: Lower chest: Small linear densities in the posterior lower lung fields may suggest minimal subsegmental atelectasis. Tip of central venous catheter is seen in the right atrium. Hepatobiliary: No focal abnormalities are seen in liver. Surgical clips are seen in gallbladder fossa. Pancreas: Pancreas is not distinctly seen. Clinical history suggests previous pancreatic transplant. Spleen: Spleen is not seen. Adrenals/Urinary Tract: Adrenals are unremarkable. There is no hydronephrosis. There are bilateral renal stones largest measuring 8 mm in size in the lower pole of left kidney. There is no perinephric fluid collection. Ureters are not dilated. Urinary bladder is unremarkable. Stomach/Bowel: Surgical staples are seen in stomach. There is dilation of proximal small bowel loops with proximal jejunum measuring 4 cm in diameter. There is fluid in the lumen of dilated proximal small bowel loops. Distal small bowel loops are decompressed. Exact level of transition is not clearly identified. There is mild fecalization in distal small bowel loops in lower mid abdomen. Appendix is not dilated. There is no significant wall thickening in colon. There is no pericolic stranding. Vascular/Lymphatic: Vascular structures are unremarkable. There are few subcentimeter nodes in mesentery and retroperitoneum. This may suggest benign reactive hyperplasia. Reproductive: Unremarkable. Other: There is no ascites or pneumoperitoneum. Small umbilical hernia containing fat is seen. Musculoskeletal: Schmorl's node is seen in the upper endplate of body of L2 vertebra with no significant change. IMPRESSION: There is dilation of proximal small bowel loops measuring up to 4 cm  in diameter. Distal and terminal ileum appear to be decompressed. Findings suggest possible partial small bowel obstruction, possibly due to adhesions. Level of transition is possibly in the lower mid abdomen. There is no hydronephrosis.  Bilateral  nonobstructing renal stones. Pancreas is not distinctly visualized. There are no abnormal fluid collections in pancreatic bed. Other findings as described in the body of the report. Electronically Signed   By: Elmer Picker M.D.   On: 10/12/2022 19:36   DG Chest 1 View  Result Date: 10/12/2022 CLINICAL DATA:  Epigastric/abdominal pain. EXAM: CHEST  1 VIEW COMPARISON:  Chest radiograph dated February 23, 2022. FINDINGS: The heart size and mediastinal contours are within normal limits. Both lungs are clear. The visualized skeletal structures are unremarkable. Right IJ access Port-A-Cath with distal tip in the SVC, unchanged. IMPRESSION: No active disease. Electronically Signed   By: Keane Police D.O.   On: 10/12/2022 18:00    EKG: I independently viewed the EKG done and my findings are as followed: Normal sinus rhythm at a rate of 94 bpm  Assessment/Plan Present on Admission:  Partial small bowel obstruction (HCC)  Nausea  Abdominal pain  Hypokalemia  GERD (gastroesophageal reflux disease)  Bipolar 1 disorder/BiPolar disorder/depression/PTSD/anxiety.--  Principal Problem:   Partial small bowel obstruction (HCC) Active Problems:   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   Bipolar 1 disorder/BiPolar disorder/depression/PTSD/anxiety.--   GERD (gastroesophageal reflux disease)   Hypokalemia   Nausea   Abdominal pain   Dehydration   Leukocytosis   Thrombocytopenia (HCC)   Mixed hyperlipidemia   History of migraine   Nausea, abdominal pain secondary to partial small bowel obstruction Continue NG tube  Continue NPO at this time with plan to advance diet as  tolerated Continue IV hydration Continue IV morphine 2 mg q.4h p.r.n. for moderate/severe pain Continue Protonix Continue Zofran p.r.n. for nausea/vomiting General surgery was consulted and will follow-up with patient in the morning by EDP   Hypokalemia K+ 3.2, this was replenished  Dehydration Continue IV hydration  Chronic leukocytosis WBC 18.7, continue to monitor WBC with morning labs  Chronic thrombocytopenia Platelets 120, continue to monitor platelet levels with morning labs  Seizure Continue on Keppra  GERD Continue Protonix  T2DM with uncontrolled hyperglycemia Continue ISS and hypoglycemic protocol  Bipolar 1 disorder/BiPolar disorder/depression/PTSD/anxiety Continue Abilify  Auto islet cell transplant-insulin-dependent Continue Creon  Mixed hyperlipidemia Continue Zocor  History of migraine Continue ubrogepant  DVT prophylaxis: SCDs (consider starting chemoprophylaxis if no indication for surgical intervention)  Code Status: Full code  Consults: General surgery  Family Communication: Significant other at bedside (all questions answered to satisfaction)  Severity of Illness: The appropriate patient status for this patient is INPATIENT. Inpatient status is judged to be reasonable and necessary in order to provide the required intensity of service to ensure the patient's safety. The patient's presenting symptoms, physical exam findings, and initial radiographic and laboratory data in the context of their chronic comorbidities is felt to place them at high risk for further clinical deterioration. Furthermore, it is not anticipated that the patient will be medically stable for discharge from the hospital within 2 midnights of admission.   * I certify that at the point of admission it is my clinical judgment that the patient will require inpatient hospital care spanning beyond 2 midnights from the point of admission due to high intensity of service, high risk  for further deterioration and high frequency of surveillance required.*  Author: Bernadette Hoit, DO 10/13/2022 5:35 AM  For on call review [email or web address].

## 2022-10-13 ENCOUNTER — Encounter (HOSPITAL_COMMUNITY): Payer: Self-pay | Admitting: Internal Medicine

## 2022-10-13 ENCOUNTER — Inpatient Hospital Stay (HOSPITAL_COMMUNITY): Payer: Medicare (Managed Care)

## 2022-10-13 DIAGNOSIS — F319 Bipolar disorder, unspecified: Secondary | ICD-10-CM

## 2022-10-13 DIAGNOSIS — K56600 Partial intestinal obstruction, unspecified as to cause: Principal | ICD-10-CM

## 2022-10-13 DIAGNOSIS — R569 Unspecified convulsions: Secondary | ICD-10-CM

## 2022-10-13 DIAGNOSIS — D72829 Elevated white blood cell count, unspecified: Secondary | ICD-10-CM | POA: Insufficient documentation

## 2022-10-13 DIAGNOSIS — E119 Type 2 diabetes mellitus without complications: Secondary | ICD-10-CM

## 2022-10-13 DIAGNOSIS — Z8669 Personal history of other diseases of the nervous system and sense organs: Secondary | ICD-10-CM

## 2022-10-13 DIAGNOSIS — D696 Thrombocytopenia, unspecified: Secondary | ICD-10-CM | POA: Insufficient documentation

## 2022-10-13 DIAGNOSIS — Z794 Long term (current) use of insulin: Secondary | ICD-10-CM

## 2022-10-13 LAB — COMPREHENSIVE METABOLIC PANEL
ALT: 33 U/L (ref 0–44)
AST: 27 U/L (ref 15–41)
Albumin: 3.2 g/dL — ABNORMAL LOW (ref 3.5–5.0)
Alkaline Phosphatase: 90 U/L (ref 38–126)
Anion gap: 6 (ref 5–15)
BUN: 24 mg/dL — ABNORMAL HIGH (ref 6–20)
CO2: 23 mmol/L (ref 22–32)
Calcium: 8.3 mg/dL — ABNORMAL LOW (ref 8.9–10.3)
Chloride: 115 mmol/L — ABNORMAL HIGH (ref 98–111)
Creatinine, Ser: 1.09 mg/dL (ref 0.61–1.24)
GFR, Estimated: >60 mL/min (ref >60)
Glucose, Bld: 95 mg/dL (ref 70–99)
Potassium: 3 mmol/L — ABNORMAL LOW (ref 3.5–5.1)
Sodium: 144 mmol/L (ref 135–145)
Total Bilirubin: 0.4 mg/dL (ref 0.3–1.2)
Total Protein: 5.3 g/dL — ABNORMAL LOW (ref 6.5–8.1)

## 2022-10-13 LAB — CBC
HCT: 29.8 % — ABNORMAL LOW (ref 39.0–52.0)
Hemoglobin: 10.2 g/dL — ABNORMAL LOW (ref 13.0–17.0)
MCH: 32.2 pg (ref 26.0–34.0)
MCHC: 34.2 g/dL (ref 30.0–36.0)
MCV: 94 fL (ref 80.0–100.0)
Platelets: 102 10*3/uL — ABNORMAL LOW (ref 150–400)
RBC: 3.17 MIL/uL — ABNORMAL LOW (ref 4.22–5.81)
RDW: 13.9 % (ref 11.5–15.5)
WBC: 13.8 10*3/uL — ABNORMAL HIGH (ref 4.0–10.5)
nRBC: 0 % (ref 0.0–0.2)

## 2022-10-13 LAB — GLUCOSE, CAPILLARY
Glucose-Capillary: 98 mg/dL (ref 70–99)
Glucose-Capillary: 91 mg/dL (ref 70–99)
Glucose-Capillary: 98 mg/dL (ref 70–99)
Glucose-Capillary: 117 mg/dL — ABNORMAL HIGH (ref 70–99)
Glucose-Capillary: 94 mg/dL (ref 70–99)

## 2022-10-13 LAB — PHOSPHORUS: Phosphorus: 3.6 mg/dL (ref 2.5–4.6)

## 2022-10-13 LAB — HIV ANTIBODY (ROUTINE TESTING W REFLEX): HIV Screen 4th Generation wRfx: NONREACTIVE

## 2022-10-13 LAB — MAGNESIUM: Magnesium: 1.9 mg/dL (ref 1.7–2.4)

## 2022-10-13 LAB — HEMOGLOBIN A1C
Hgb A1c MFr Bld: 10 % — ABNORMAL HIGH (ref 4.8–5.6)
Mean Plasma Glucose: 240.3 mg/dL

## 2022-10-13 MED ORDER — ORAL CARE MOUTH RINSE: 15.0000 mL | OROMUCOSAL | Status: DC | PRN

## 2022-10-13 MED ORDER — POTASSIUM CHLORIDE 10 MEQ/100ML IV SOLN
10.0000 meq | INTRAVENOUS | Status: AC
  Administered 2022-10-13 (×4): 10 meq via INTRAVENOUS
  Filled 2022-10-13 (×3): qty 100

## 2022-10-13 MED ORDER — CHLORHEXIDINE GLUCONATE CLOTH 2 % EX PADS
6.0000 | MEDICATED_PAD | Freq: Every day | CUTANEOUS | Status: DC
  Administered 2022-10-13 – 2022-10-14 (×2): 6 via TOPICAL

## 2022-10-13 MED ORDER — DIATRIZOATE MEGLUMINE & SODIUM 66-10 % PO SOLN
ORAL | Status: AC
  Administered 2022-10-13: 30 mL
  Filled 2022-10-13: qty 90

## 2022-10-13 MED ORDER — DIATRIZOATE MEGLUMINE & SODIUM 66-10 % PO SOLN: 90.0000 mL | Freq: Once | ORAL | Status: AC

## 2022-10-13 MED ORDER — ORAL CARE MOUTH RINSE
15.0000 mL | OROMUCOSAL | Status: DC
  Administered 2022-10-13 – 2022-10-14 (×4): 15 mL via OROMUCOSAL

## 2022-10-13 NOTE — Assessment & Plan Note (Signed)
Seems compensated - We will have to hold Trintellix, Vraylar, zolpidem, and prazosin for now

## 2022-10-13 NOTE — TOC Progression Note (Signed)
  Transition of Care [facility]) Screening Note   Patient Details  Name: Dennis Yang Date of Birth: 1986/07/01   Transition of Care [facility]) CM/SW Contact:    Shade Flood, LCSW Phone Number: 10/13/2022, 10:46 AM    Transition of Care Department [facility]) has reviewed patient and no TOC needs have been identified at this time. We will continue to monitor patient advancement through interdisciplinary progression rounds. If new patient transition needs arise, please place a TOC consult.

## 2022-10-13 NOTE — Assessment & Plan Note (Addendum)
No pancreatitis on CT - We will have to hold Creon for now

## 2022-10-13 NOTE — Assessment & Plan Note (Signed)
No seizures - Continue Keppra in IV form

## 2022-10-13 NOTE — Progress Notes (Signed)
Patient awake and alert. Vital signs stable. NGT to LWS in left nostril. PRN pain medication administered x2 and effective per patient. Gastrografin was administered this morning and radiology made aware. Patient mobilized at highest level of mobility. Spouse is at the bedside. Safety maintained, call bell within reach, bed in lowest position, bed alarm on and functioning without any issues. Will continue to monitor and endorse.

## 2022-10-13 NOTE — Hospital Course (Signed)
Mr. Stitely is a 36 y.o. M with DM, chronic pancreatitis, hx pancreatectomy with auto-islet cell transplant, Bipolar, chronic pain, and seizures who presented with abdominal pain.   In the ER, CT suggested pSBO.   11/3: Admitted on fluids, NG placed

## 2022-10-13 NOTE — Assessment & Plan Note (Signed)
Treated and resolved 

## 2022-10-13 NOTE — Assessment & Plan Note (Signed)
Admitted with NG placed overnight.  Some thick brownish, not too copious output in the canister.  Already passing gas, still some discomfort, not too bad. - Maintain NG - Start small bowel protocol - Consult general surgery, appreciate recommendations - Continue IV fluids

## 2022-10-13 NOTE — Assessment & Plan Note (Signed)
-   We will have to hold omeprazole for now

## 2022-10-13 NOTE — Progress Notes (Signed)
[facility]  Kub with contrast through colon. Can dc NG and start clears; patient to go slow.  Curlene Labrum, MD

## 2022-10-13 NOTE — Assessment & Plan Note (Signed)
Glucose controlled - Continue sliding scale corrections 

## 2022-10-13 NOTE — Consult Note (Signed)
[facility] Consult  Reason for Consult: SBO Referring Physician:  Dr. Maryfrances Bunnell  Chief Complaint   Chest Pain; Abdominal Pain     HPI: Dennis Yang is a 36 y.o. male with a complicated medical and surgical history that was not fully documented in our Epic System until this Note after which I have placed the appropriate information for his medical and surgical history, which is important to understand his imaging and possible issues he could have regarding his intestine.  The patient had a history of chronic abdominal pain from chronic pancreatitis.   The patient had surgery at [facility] with Dr. Verdie Drown Transplant on May 2021. He underwent an Ex lap, 1. 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 Antrectomy 4. Splenectomy 5. Gastrojejunostomy (retrocolic) 6. Roux Y hepaticojejunostomy (Retrocolic) 7. Placement of nasoenteric feeding tube 8. Lysis of extensive peripancreatic adhesions.  He has followed with Dr. Lacie Draft team every year  since that time.   He has been having issues with abdominal pain and nausea/vomiting. He had a CT here in August that demonstrated no major concerns and a CT this admission with concern for SBO but no distinct transition point. Dr. Maryfrances Bunnell saw the patient this AM and ordered a SBFT which will have the 8 hour delay at 630pm.   The patient is resting now but still feels bloated. He says his last BM was this week and that he has had some issues with constipation but that his Bms are soft normally. He says that he is passing gas now.   He reports that since 2021 he has lost 120 lbs. He is at risk for other complications other than a simple SBO from adhesions and could have things like internal hernia. He denies any NSAID use.   When he does have Bms he has abdominal pain. He use to have Bms daily but now it is more like every 3 days. He has used stool softeners and takes motegrity. He is a patient of [facility] he  had a normal EGD in July 2023, and was started on motegrity daily and lactulose PRN and was noted to have gastroparesis on imaging and was referred to [facility] gastroparesis clinic.    Past Medical History:  Diagnosis Date   Anxiety    Back pain    Bipolar 1 disorder (HCC)    Depression    Diabetes mellitus without complication (HCC)    Incontinence of bowel    Incontinence of urine    Neck pain    Neuropathy    Osteoarthritis    Osteonecrosis (HCC)    left foot   Pancreatitis    Pancreatitis, chronic (HCC) history treated with total pancreatectomy 2021    05/03/20 Dr. Verdie Drown -[facility] Transplant Team 1. 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 Antrectomy 4. Splenectomy 5. Gastrojejunostomy (retrocolic) 6. Roux Y hepaticojejunostomy (Retrocolic) 7. Placement of nasoenteric feeding tube 8. Lysis of extensive peripancreatic adhesions.   Schizoaffective disorder (HCC)    TIA (transient ischemic attack)     Past Surgical History:  Procedure Laterality Date   botox injections in bladder     CHOLECYSTECTOMY     ELBOW FRACTURE SURGERY     FOOT SURGERY     HERNIA REPAIR     LITHOTRIPSY     NASAL SINUS SURGERY     PANCREATECTOMY  04/2020   05/03/20 Dr. Verdie Drown- [facility] Transplant Surgery 1. 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 Antrectomy 4. Splenectomy 5. Gastrojejunostomy (  retrocolic) 6. Roux Y hepaticojejunostomy (Retrocolic) 7. Placement of nasoenteric feeding tube 8. Lysis of extensive peripancreatic adhesions (This was treatment for chronic pancreatitis/ chronic pain).    Family History  Problem Relation Age of Onset   Kidney disease Mother    Depression Mother    Hypertension Mother    Hypertension Father     Social History   Tobacco Use   Smoking status: Former   Smokeless tobacco: Never  Building services engineer Use: Never used  Substance Use Topics   Alcohol use: Not Currently   Drug use: Not Currently     Medications: I have reviewed the patient's current medications. Prior to Admission:  Medications Prior to Admission  Medication Sig Dispense Refill Last Dose   acetaminophen (TYLENOL) 325 MG tablet Take 2 tablets (650 mg total) by mouth every 6 (six) hours as needed for mild pain, fever or headache (or Fever >/= 101). 12 tablet 0    anagrelide (AGRYLIN) 1 MG capsule Take 1 mg by mouth in the morning and at bedtime.      ARIPiprazole (ABILIFY) 20 MG tablet Take 20 mg by mouth at bedtime.      ATROVENT HFA 17 MCG/ACT inhaler Inhale 1 puff into the lungs every 4 (four) hours as needed for wheezing.       cetirizine (ZYRTEC) 10 MG tablet Take 10 mg by mouth daily.      chlorhexidine (HIBICLENS) 4 % external liquid Apply 1 application topically daily as needed (for skin irritation as directed).       Cholecalciferol (VITAMIN D3) 1.25 MG (50000 UT) CAPS Take 1 capsule by mouth every Monday, Wednesday, and Friday. 3 times a week      CREON 36000 units CPEP capsule Take 36,000-72,000 Units by mouth See admin instructions. Take 2 capsules by mouth with meals and 1 capsule with snacks      cyclobenzaprine (FLEXERIL) 10 MG tablet Take 10 mg by mouth 3 (three) times daily as needed for muscle spasms.      diclofenac Sodium (VOLTAREN) 1 % GEL Apply 2 g topically daily as needed (for pain).       EMGALITY 120 MG/ML SOAJ Inject 1 Dose into the muscle every 30 (thirty) days.    More than a month   fluticasone (FLONASE) 50 MCG/ACT nasal spray Place 1 spray into both nostrils daily.       fluticasone-salmeterol (ADVAIR) 250-50 MCG/ACT AEPB Inhale 1 puff into the lungs 2 (two) times daily.      HUMALOG KWIKPEN 100 UNIT/ML KwikPen Inject 16 Units into the skin 3 (three) times daily before meals.      hydrOXYzine (ATARAX/VISTARIL) 25 MG tablet Take 25 mg by mouth 4 (four) times daily.      levETIRAcetam (KEPPRA) 750 MG tablet Take 750 mg by mouth in the morning and at bedtime.      lidocaine (XYLOCAINE) 4 %  external solution Apply topically daily as needed for mild pain or moderate pain.       linaclotide (LINZESS) 72 MCG capsule Take 72 mcg by mouth daily.   More than a month   meclizine (ANTIVERT) 25 MG tablet Take 25 mg by mouth every 6 (six) hours as needed for dizziness or nausea.       meloxicam (MOBIC) 15 MG tablet Take 15 mg by mouth daily.      metaxalone (SKELAXIN) 800 MG tablet Take 800 mg by mouth every 8 (eight) hours as needed for muscle spasms.  metoCLOPramide (REGLAN) 10 MG tablet Take 2 tablets (20 mg total) by mouth every 6 (six) hours as needed for nausea, vomiting or refractory nausea / vomiting. 40 tablet 0    montelukast (SINGULAIR) 10 MG tablet Take 10 mg by mouth daily.       nitroGLYCERIN (NITROSTAT) 0.4 MG SL tablet Place 0.4 mg under the tongue every 5 (five) minutes as needed for chest pain.       omeprazole (PRILOSEC) 40 MG capsule Take 1 capsule (40 mg total) by mouth daily. 30 capsule 3    ondansetron (ZOFRAN-ODT) 8 MG disintegrating tablet Take 1 tablet (8 mg total) by mouth 3 (three) times daily before meals.      prazosin (MINIPRESS) 5 MG capsule Take 5 mg by mouth at bedtime.      pregabalin (LYRICA) 200 MG capsule Take 200 mg by mouth 2 (two) times daily.      propranolol (INDERAL) 40 MG tablet Take 40 mg by mouth 2 (two) times daily.       simvastatin (ZOCOR) 20 MG tablet Take 10 mg by mouth daily at 6 PM.       sucralfate (CARAFATE) 1 g tablet Take 1 g by mouth 3 (three) times daily.      tamsulosin (FLOMAX) 0.4 MG CAPS capsule Take 0.4 mg by mouth daily.      topiramate (TOPAMAX) 100 MG tablet Take 100-200 mg by mouth 3 (three) times daily. Take 100 mg in the morning and at noon. Take 200 mg at bedtime      TRINTELLIX 20 MG TABS tablet Take 20 mg by mouth daily.      UBRELVY 100 MG TABS Take 100 mg by mouth daily as needed (migraines).      Vibegron (GEMTESA) 75 MG TABS Take 75 mg by mouth daily.      VRAYLAR 3 MG capsule Take 3 mg by mouth daily.       zolpidem (AMBIEN) 5 MG tablet Take 5 mg by mouth at bedtime as needed for sleep.      Scheduled:  ARIPiprazole  20 mg Oral Daily   Chlorhexidine Gluconate Cloth  6 each Topical Daily   insulin aspart  0-9 Units Subcutaneous Q4H   lipase/protease/amylase  72,000 Units Oral TID with meals   mouth rinse  15 mL Mouth Rinse 4 times per day   pantoprazole (PROTONIX) IV  40 mg Intravenous Q12H   simvastatin  10 mg Oral q1800   Continuous:  levETIRAcetam 750 mg (10/13/22 1202)   YQM:VHQION/GEXBMWUX/LKGMWNU, morphine injection, ondansetron **OR** ondansetron (ZOFRAN) IV, mouth rinse, Ubrogepant  Allergies  Allergen Reactions   Ciprofloxacin Hives     ROS:  A comprehensive review of systems was negative except for: Gastrointestinal: positive for abdominal pain, constipation, nausea, and vomiting  Blood pressure 122/79, pulse (!) 57, temperature (!) 97.5 F (36.4 C), temperature source Oral, resp. rate 20, height 6' (1.829 m), weight 79.4 kg, SpO2 98 %. Physical Exam Vitals reviewed.  HENT:     Head: Normocephalic.  Cardiovascular:     Rate and Rhythm: Normal rate.  Pulmonary:     Effort: Pulmonary effort is normal.  Abdominal:     General: There is distension.     Palpations: Abdomen is soft.     Tenderness: There is generalized abdominal tenderness.     Comments: Minor pain with palpation  Musculoskeletal:        General: Normal range of motion.     Cervical back: Normal range of  motion.  Skin:    General: Skin is warm.  Neurological:     General: No focal deficit present.     Mental Status: He is alert and oriented to person, place, and time.  Psychiatric:        Mood and Affect: Mood normal.        Behavior: Behavior normal.     Results: Results for orders placed or performed during the hospital encounter of 10/12/22 (from the past 48 hour(s))  POC CBG, ED     Status: Abnormal   Collection Time: 10/12/22  5:26 PM  Result Value Ref Range   Glucose-Capillary 191 (H)  70 - 99 mg/dL    Comment: Glucose reference range applies only to samples taken after fasting for at least 8 hours.  CBC     Status: Abnormal   Collection Time: 10/12/22  5:39 PM  Result Value Ref Range   WBC 18.7 (H) 4.0 - 10.5 K/uL   RBC 3.37 (L) 4.22 - 5.81 MIL/uL   Hemoglobin 10.9 (L) 13.0 - 17.0 g/dL   HCT 16.131.4 (L) 09.639.0 - 04.552.0 %   MCV 93.2 80.0 - 100.0 fL   MCH 32.3 26.0 - 34.0 pg   MCHC 34.7 30.0 - 36.0 g/dL   RDW 40.913.6 81.111.5 - 91.415.5 %   Platelets 120 (L) 150 - 400 K/uL   nRBC 0.0 0.0 - 0.2 %    Comment: Performed at [facility], [address] [place], [place] [address]  Troponin I (High Sensitivity)     Status: None   Collection Time: 10/12/22  5:39 PM  Result Value Ref Range   Troponin I (High Sensitivity) <2 <18 ng/L    Comment: (NOTE) Elevated high sensitivity troponin I (hsTnI) values and significant  changes across serial measurements may suggest ACS but many other  chronic and acute conditions are known to elevate hsTnI results.  Refer to the Links section for chest pain algorithms and additional  guidance. Performed at [facility], [address] [place], [place] [address]   Lipase, blood     Status: None   Collection Time: 10/12/22  5:39 PM  Result Value Ref Range   Lipase 20 11 - 51 U/L    Comment: Performed at [facility], [address] [place], [place] [address]  Comprehensive metabolic panel     Status: Abnormal   Collection Time: 10/12/22  5:39 PM  Result Value Ref Range   Sodium 140 135 - 145 mmol/L   Potassium 3.2 (L) 3.5 - 5.1 mmol/L   Chloride 113 (H) 98 - 111 mmol/L   CO2 21 (L) 22 - 32 mmol/L   Glucose, Bld 221 (H) 70 - 99 mg/dL    Comment: Glucose reference range applies only to samples taken after fasting for at least 8 hours.   BUN 26 (H) 6 - 20 mg/dL   Creatinine, Ser 4.691.23 0.61 - 1.24 mg/dL   Calcium 8.3 (L) 8.9 - 10.3 mg/dL   Total Protein 6.0 (L) 6.5 - 8.1 g/dL   Albumin 3.6 3.5 - 5.0 g/dL   AST 35 15 - 41 U/L   ALT 36 0 - 44 U/L    Alkaline Phosphatase 104 38 - 126 U/L   Total Bilirubin 0.3 0.3 - 1.2 mg/dL   GFR, Estimated >62>60 >95>60 mL/min    Comment: (NOTE) Calculated using the CKD-EPI Creatinine Equation (2021)    Anion gap 6 5 - 15    Comment: Performed at [facility],  [address], [place], [place] [address]  Troponin I (High Sensitivity)     Status: None   Collection Time: 10/12/22  7:25 PM  Result Value Ref Range   Troponin I (High Sensitivity) <2 <18 ng/L    Comment: (NOTE) Elevated high sensitivity troponin I (hsTnI) values and significant  changes across serial measurements may suggest ACS but many other  chronic and acute conditions are known to elevate hsTnI results.  Refer to the "Links" section for chest pain algorithms and additional  guidance. Performed at [facility], [address], [place], [place] [address]   Urinalysis, Routine w reflex microscopic Urine, Clean Catch     Status: Abnormal   Collection Time: 10/12/22  7:40 PM  Result Value Ref Range   Color, Urine YELLOW YELLOW   APPearance CLEAR CLEAR   Specific Gravity, Urine 1.034 (H) 1.005 - 1.030   pH 5.0 5.0 - 8.0   Glucose, UA 150 (A) NEGATIVE mg/dL   Hgb urine dipstick NEGATIVE NEGATIVE   Bilirubin Urine NEGATIVE NEGATIVE   Ketones, ur NEGATIVE NEGATIVE mg/dL   Protein, ur NEGATIVE NEGATIVE mg/dL   Nitrite NEGATIVE NEGATIVE   Leukocytes,Ua NEGATIVE NEGATIVE    Comment: Performed at [facility], [address], [place], [place] [address]  Hemoglobin A1c     Status: Abnormal   Collection Time: 10/12/22 11:06 PM  Result Value Ref Range   Hgb A1c MFr Bld 10.0 (H) 4.8 - 5.6 %    Comment: (NOTE) Pre diabetes:          5.7%-6.4%  Diabetes:              >6.4%  Glycemic control for   <7.0% adults with diabetes    Mean Plasma Glucose 240.3 mg/dL    Comment: Performed at [facility] [address], [place], [place] [address]  Glucose, capillary     Status: Abnormal   Collection Time: 10/12/22 11:23 PM  Result Value Ref  Range   Glucose-Capillary 166 (H) 70 - 99 mg/dL    Comment: Glucose reference range applies only to samples taken after fasting for at least 8 hours.  HIV Antibody (routine testing w rflx)     Status: None   Collection Time: 10/13/22  3:56 AM  Result Value Ref Range   HIV Screen 4th Generation wRfx Non Reactive Non Reactive    Comment: Performed at [facility], [address], [place], [place] [address]  Comprehensive metabolic panel     Status: Abnormal   Collection Time: 10/13/22  3:57 AM  Result Value Ref Range   Sodium 144 135 - 145 mmol/L   Potassium 3.0 (L) 3.5 - 5.1 mmol/L   Chloride 115 (H) 98 - 111 mmol/L   CO2 23 22 - 32 mmol/L   Glucose, Bld 95 70 - 99 mg/dL    Comment: Glucose reference range applies only to samples taken after fasting for at least 8 hours.   BUN 24 (H) 6 - 20 mg/dL   Creatinine, Ser 1.09 0.61 - 1.24 mg/dL   Calcium 8.3 (L) 8.9 - 10.3 mg/dL   Total Protein 5.3 (L) 6.5 - 8.1 g/dL   Albumin 3.2 (L) 3.5 - 5.0 g/dL   AST 27 15 - 41 U/L   ALT 33 0 - 44 U/L   Alkaline Phosphatase 90 38 - 126 U/L   Total Bilirubin 0.4 0.3 - 1.2 mg/dL   GFR, Estimated >60 >60 mL/min    Comment: (NOTE) Calculated  using the CKD-EPI Creatinine Equation (2021)    Anion gap 6 5 - 15    Comment: Performed at [facility], [address] [place], [place] [address]  CBC     Status: Abnormal   Collection Time: 10/13/22  3:57 AM  Result Value Ref Range   WBC 13.8 (H) 4.0 - 10.5 K/uL   RBC 3.17 (L) 4.22 - 5.81 MIL/uL   Hemoglobin 10.2 (L) 13.0 - 17.0 g/dL   HCT 10.1 (L) 75.1 - 02.5 %   MCV 94.0 80.0 - 100.0 fL   MCH 32.2 26.0 - 34.0 pg   MCHC 34.2 30.0 - 36.0 g/dL   RDW 85.2 77.8 - 24.2 %   Platelets 102 (L) 150 - 400 K/uL   nRBC 0.0 0.0 - 0.2 %    Comment: Performed at [facility], [address], [place], [place] [address]  Magnesium     Status: None   Collection Time: 10/13/22  3:57 AM  Result Value Ref Range   Magnesium 1.9 1.7 - 2.4 mg/dL    Comment: Performed  at [facility], [address] [place], [place] [address]  Phosphorus     Status: None   Collection Time: 10/13/22  3:57 AM  Result Value Ref Range   Phosphorus 3.6 2.5 - 4.6 mg/dL    Comment: Performed at [facility], [address], [place], [place] [address]  Glucose, capillary     Status: None   Collection Time: 10/13/22  4:08 AM  Result Value Ref Range   Glucose-Capillary 98 70 - 99 mg/dL    Comment: Glucose reference range applies only to samples taken after fasting for at least 8 hours.  Glucose, capillary     Status: None   Collection Time: 10/13/22  7:31 AM  Result Value Ref Range   Glucose-Capillary 91 70 - 99 mg/dL    Comment: Glucose reference range applies only to samples taken after fasting for at least 8 hours.  Glucose, capillary     Status: None   Collection Time: 10/13/22 11:59 AM  Result Value Ref Range   Glucose-Capillary 98 70 - 99 mg/dL    Comment: Glucose reference range applies only to samples taken after fasting for at least 8 hours.   Reviewed CT and called Radiology and Dr. Loni Dolly reviewed it again after I told him the exact surgery patient had, he is only seeing the dilated jejunum at the gastrojejunostomy, no obvious internal hernias or anything.  DG Abd Portable 1V-Small Bowel Protocol-Position Verification  Result Date: 10/13/2022 CLINICAL DATA:  Nasogastric tube placement EXAM: PORTABLE ABDOMEN - 1 VIEW COMPARISON:  Prior abdominal radiographs yesterday FINDINGS: Nasogastric tube in good position with the tip overlying the gastric antrum. Partially imaged port catheter overlying the cavoatrial junction. The lungs are clear. Surgical clips in the right upper quadrant consistent with prior cholecystectomy. IMPRESSION: Well-positioned gastric tube. Electronically Signed   By: Malachy Moan M.D.   On: 10/13/2022 10:26   DG Chest Port 1 View  Result Date: 10/12/2022 CLINICAL DATA:  NG tube placement; epigastric and chest pain EXAM: PORTABLE CHEST 1  VIEW COMPARISON:  Radiographs earlier today FINDINGS: Interval placement of a enteric tube with tip and side-port in the stomach. Remainder unchanged. Accessed right chest wall Port-A-Cath with tip at the superior cavoatrial junction. Normal cardiomediastinal silhouette. No focal consolidation, pleural effusion, or pneumothorax. No acute osseous abnormality. IMPRESSION: Enteric tube tip and side-port in the stomach. Otherwise no change from earlier today. Electronically Signed  By: Minerva Fester M.D.   On: 10/12/2022 21:36   DG Abd Portable 1 View  Result Date: 10/12/2022 CLINICAL DATA:  Evaluate nasogastric tube placement. EXAM: PORTABLE ABDOMEN - 1 VIEW COMPARISON:  April 05, 2019 FINDINGS: A nasogastric tube is seen with its distal tip overlying the expected region of the gastric fundus. Its distal side hole sits approximately 7.3 cm distal to the expected region of the gastroesophageal junction. Dilated loops of air-filled small bowel are seen within the upper abdomen. A large amount of stool is seen throughout the colon. Radiopaque surgical clips are seen overlying the right upper quadrant. No radio-opaque calculi or other significant radiographic abnormality are seen. IMPRESSION: 1. Nasogastric tube positioning, as described above. 2. Findings consistent with the partial small bowel obstruction seen on the prior abdomen and pelvis CT (dated October 12, 2022). Electronically Signed   By: Aram Candela M.D.   On: 10/12/2022 21:14   CT ABDOMEN PELVIS W CONTRAST  Result Date: 10/12/2022 CLINICAL DATA:  Epigastric pain, nausea, chest pain EXAM: CT ABDOMEN AND PELVIS WITH CONTRAST TECHNIQUE: Multidetector CT imaging of the abdomen and pelvis was performed using the standard protocol following bolus administration of intravenous contrast. RADIATION DOSE REDUCTION: This exam was performed according to the departmental dose-optimization program which includes automated exposure control, adjustment of the  mA and/or kV according to patient size and/or use of iterative reconstruction technique. CONTRAST:  OMNIPAQUE IOHEXOL 300 MG/ML  SOLN COMPARISON:  07/21/2022 FINDINGS: Lower chest: Small linear densities in the posterior lower lung fields may suggest minimal subsegmental atelectasis. Tip of central venous catheter is seen in the right atrium. Hepatobiliary: No focal abnormalities are seen in liver. Surgical clips are seen in gallbladder fossa. Pancreas: Pancreas is not distinctly seen. Clinical history suggests previous pancreatic transplant. Spleen: Spleen is not seen. Adrenals/Urinary Tract: Adrenals are unremarkable. There is no hydronephrosis. There are bilateral renal stones largest measuring 8 mm in size in the lower pole of left kidney. There is no perinephric fluid collection. Ureters are not dilated. Urinary bladder is unremarkable. Stomach/Bowel: Surgical staples are seen in stomach. There is dilation of proximal small bowel loops with proximal jejunum measuring 4 cm in diameter. There is fluid in the lumen of dilated proximal small bowel loops. Distal small bowel loops are decompressed. Exact level of transition is not clearly identified. There is mild fecalization in distal small bowel loops in lower mid abdomen. Appendix is not dilated. There is no significant wall thickening in colon. There is no pericolic stranding. Vascular/Lymphatic: Vascular structures are unremarkable. There are few subcentimeter nodes in mesentery and retroperitoneum. This may suggest benign reactive hyperplasia. Reproductive: Unremarkable. Other: There is no ascites or pneumoperitoneum. Small umbilical hernia containing fat is seen. Musculoskeletal: Schmorl's node is seen in the upper endplate of body of L2 vertebra with no significant change. IMPRESSION: There is dilation of proximal small bowel loops measuring up to 4 cm in diameter. Distal and terminal ileum appear to be decompressed. Findings suggest possible partial  small bowel obstruction, possibly due to adhesions. Level of transition is possibly in the lower mid abdomen. There is no hydronephrosis.  Bilateral nonobstructing renal stones. Pancreas is not distinctly visualized. There are no abnormal fluid collections in pancreatic bed. Other findings as described in the body of the report. Electronically Signed   By: Ernie Avena M.D.   On: 10/12/2022 19:36   DG Chest 1 View  Result Date: 10/12/2022 CLINICAL DATA:  Epigastric/abdominal pain. EXAM: CHEST  1 VIEW  COMPARISON:  Chest radiograph dated February 23, 2022. FINDINGS: The heart size and mediastinal contours are within normal limits. Both lungs are clear. The visualized skeletal structures are unremarkable. Right IJ access Port-A-Cath with distal tip in the SVC, unchanged. IMPRESSION: No active disease. Electronically Signed   By: Larose Hires D.O.   On: 10/12/2022 18:00     Assessment & Plan:  Daelan Gatt is a 36 y.o. male with a complicated medical and surgical history. I have now updated our records to help with future admissions and this admission. He has as history of a work up with [facility] out of [place] and has been found to have gastroparesis and started on motegrity and lactulose for constipation. No signs of any internal hernia on repeat look by Dr. Loni Dolly. The only dilated bowel is the jejunum close to the GJ anastomosis. This all could be just dilated bowel from it being de-innervated and not really any true obstruction.  Pending SBFT will determine if we can put him back on his motegrity and lactulose.  Regardless I think he needs to have the images from here sent to [facility] so they have them. This could be via PACS or via a CD that the patient takes.   All questions were answered to the satisfaction of the patient and family.  Updated team.   Greater than 50% of the 75 minute visit was spent in counseling/ coordination of care regarding his history, updating our epic  system, talking with the patient and radiology and reviewing, and updating the team.   Dennis Yang 10/13/2022, 4:00 PM

## 2022-10-13 NOTE — Assessment & Plan Note (Signed)
Platelets seems stable - We will restart anagrelide when able to take p.o.

## 2022-10-13 NOTE — Assessment & Plan Note (Addendum)
No migraines - We will have to hold Topamax and Emgality for now

## 2022-10-13 NOTE — Progress Notes (Signed)
Patient c/o pain. Floating RN administered the pain medication and patient's husband informed assigned RN that the medication was not administered but thrown away. Float nurse and assigned nurse explained to patient and spouse that the medication was administered and the empty syringe was thrown away. Assigned nurse devised a plan to allow patient and spouse to keep up with administration times and next dose of pain medication. Patient and spouse are exceptive of the idea. CN made aware.

## 2022-10-13 NOTE — Progress Notes (Signed)
  Progress Note   Patient: Dennis Yang URK:[id number] DOB: 07/16/1986 DOA: 10/12/2022     1 DOS: the patient was seen and examined on 10/13/2022 at 8:51 AM      Brief hospital course: Dennis Yang is a 36 y.o. M with DM, chronic pancreatitis, hx pancreatectomy with auto-islet cell transplant, Bipolar, chronic pain, and seizures who presented with abdominal pain.   In the ER, CT suggested pSBO.   11/3: Admitted on fluids, NG placed     Assessment and Plan: * Partial small bowel obstruction ([place]) Admitted with NG placed overnight.  Some thick brownish, not too copious output in the canister.  Already passing gas, still some discomfort, not too bad. - Maintain NG - Start small bowel protocol - Consult general surgery, appreciate recommendations - Continue IV fluids      Seizures ([place]) No seizures - Continue Keppra in IV form  History of migraine No migraines - We will have to hold Topamax and Emgality for now  Thrombocytopenia (HCC) Platelets seems stable - We will restart anagrelide when able to take p.o.  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     Physical Exam: BP 101/68 (BP Location: Left Arm)   Pulse 62   Temp 97.6 F (36.4 C)   Resp 16   Ht 6' (1.829 m)   Wt 79.4 kg   SpO2 98%   BMI 23.73 kg/m   Adult  male, lying in bed, appears tired and uncomfortable RRR, no murmurs, no peripheral edema Respiratory rate normal, lungs clear without rales or wheezes Abdomen soft, diffuse tenderness but no rigidity, guarding, rebound, distention. Attention normal, affect blunted, judgment insight appear normal, face symmetric, speech fluent    Data Reviewed: Discussed with general surgery Basic metabolic panel shows worsening hypokalemia, normal renal function and sodium Magnesium normal White blood cell count down to 13 CT of the abdomen and pelvis shows partial small bowel obstruction, no pancreatitis Platelets 102 Hemoglobin stable at 10    Family Communication: Support person at the bedside    Disposition: Status is: Inpatient         Author: Edwin Dada, MD 10/13/2022 11:12 AM  For on call review [email or web address].

## 2022-10-14 LAB — GLUCOSE, CAPILLARY
Glucose-Capillary: 110 mg/dL — ABNORMAL HIGH (ref 70–99)
Glucose-Capillary: 162 mg/dL — ABNORMAL HIGH (ref 70–99)
Glucose-Capillary: 173 mg/dL — ABNORMAL HIGH (ref 70–99)
Glucose-Capillary: 195 mg/dL — ABNORMAL HIGH (ref 70–99)

## 2022-10-14 LAB — BASIC METABOLIC PANEL
Anion gap: 7 (ref 5–15)
BUN: 22 mg/dL — ABNORMAL HIGH (ref 6–20)
CO2: 22 mmol/L (ref 22–32)
Calcium: 8.6 mg/dL — ABNORMAL LOW (ref 8.9–10.3)
Chloride: 114 mmol/L — ABNORMAL HIGH (ref 98–111)
Creatinine, Ser: 1.06 mg/dL (ref 0.61–1.24)
GFR, Estimated: >60 mL/min (ref >60)
Glucose, Bld: 160 mg/dL — ABNORMAL HIGH (ref 70–99)
Potassium: 3.7 mmol/L (ref 3.5–5.1)
Sodium: 143 mmol/L (ref 135–145)

## 2022-10-14 MED ORDER — HEPARIN SOD (PORK) LOCK FLUSH 100 UNIT/ML IV SOLN
500.0000 [IU] | INTRAVENOUS | Status: DC | PRN
  Filled 2022-10-14: qty 5

## 2022-10-14 NOTE — Discharge Summary (Signed)
Physician Discharge Summary  Dennis Yang [id number] DOB: 01-Aug-1986 DOA: 10/12/2022  PCP: Beola Cord, FNP  Admit date: 10/12/2022  Discharge date: 10/14/2022  Admitted From:Home  Disposition:  Home  Recommendations for Outpatient Follow-up:  Follow up with PCP in 1-2 weeks Follow-up with [facility] transplant team Continue other home medications as prior  Home Health: None  Equipment/Devices: None  Discharge Condition:Stable  CODE STATUS: Full  Diet recommendation: Heart Healthy/carb modified  Brief/Interim Summary:  Dennis Yang is a 36 y.o. male with medical history significant of chronic dermatitis, T2DM with pancreatectomy and auto islet cell transplant-insulin-dependent,, chronic back pain, seizure disorder, prior alcohol abuse, neuropathy who presented to the ED due to epigastric pain.  He was noted to have signs of partial small bowel obstruction versus some gastroparesis and was admitted for further evaluation work-up with NG tube and n.p.o. status.  He was maintained on some IV hydration as well.  General surgery had consulted and seen the patient and it was determined that patient was not an operative candidate and seem to be tolerating dietary advancement quite well with no further abdominal pain, nausea, or vomiting.  He is currently in stable condition for discharge and will follow-up at [facility] as previously scheduled.  He will receive radiology imaging studies on a CD and a letter has been put in for this.  No other acute events noted throughout the course of this hospitalization.  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  Discharge Instructions  Discharge Instructions     Diet - low sodium heart healthy   Complete by: As directed    Increase activity slowly   Complete by: As directed       Allergies as of 10/14/2022       Reactions   Ciprofloxacin Hives        Medication List     TAKE these medications    acetaminophen 325 MG tablet Commonly known as: TYLENOL Take 2 tablets (650 mg total) by mouth every 6 (six) hours as needed for mild pain, fever or headache (or Fever >/= 101).   amitriptyline 10 MG tablet Commonly known as: ELAVIL Take 1 tablet by mouth at bedtime.   anagrelide 1 MG capsule Commonly known as: AGRYLIN Take 1 mg by mouth in the morning and at bedtime.   ARIPiprazole 20 MG tablet Commonly known as: ABILIFY Take 20 mg by mouth at bedtime.   Atrovent HFA 17 MCG/ACT inhaler Generic drug: ipratropium Inhale 1 puff into the lungs every 4 (four) hours as needed for wheezing.   Creon 36000 UNITS Cpep capsule Generic drug: lipase/protease/amylase Take [id number] Units by mouth See admin instructions. Take 2 capsules by mouth with meals and 1 capsule with snacks   cyclobenzaprine 10 MG tablet Commonly known as: FLEXERIL Take 10 mg by mouth 3 (three) times daily as needed for muscle spasms.   diclofenac Sodium 1 % Gel Commonly known as: VOLTAREN Apply 2 g topically daily as needed (for pain).   dicyclomine 20 MG tablet Commonly known as: BENTYL Take 20 mg by mouth 4 (four) times daily as needed.  DSS 100 MG Caps Take 1 capsule by mouth daily.   emtricitabine-tenofovir 200-300 MG tablet Commonly known as: TRUVADA Take 1 tablet by mouth daily.   eszopiclone 2 MG Tabs tablet Commonly known as: LUNESTA Take 1 tablet by mouth daily with breakfast.   FeroSul 325 (65 FE) MG tablet Generic drug: ferrous sulfate Take 325 mg by  mouth daily.   fluticasone 50 MCG/ACT nasal spray Commonly known as: FLONASE Place 1 spray into both nostrils daily.   Gemtesa 75 MG Tabs Generic drug: Vibegron Take 75 mg by mouth daily.   Gvoke HypoPen 2-Pack 0.5 MG/0.1ML Soaj Generic drug: Glucagon Inject 0.5 mg into the skin as needed (low blood sugar).   hydrochlorothiazide 12.5 MG tablet Commonly known as: HYDRODIURIL Take 12.5 mg by mouth daily.   hydrOXYzine 25 MG tablet Commonly known as: ATARAX Take 25 mg by mouth 4 (four) times daily.   IBU 800 MG tablet Generic drug: ibuprofen Take 800 mg by mouth every 8 (eight) hours as needed for moderate pain.   Lantus SoloStar 100 UNIT/ML Solostar Pen Generic drug: insulin glargine 16 Units at bedtime.   levETIRAcetam 750 MG tablet Commonly known as: KEPPRA Take 750 mg by mouth in the morning and at bedtime.   meclizine 25 MG tablet Commonly known as: ANTIVERT Take 25 mg by mouth every 6 (six) hours as needed for dizziness or nausea.   meloxicam 15 MG tablet Commonly known as: MOBIC Take 15 mg by mouth daily.   methenamine 1 g tablet Commonly known as: HIPREX Take 1 g by mouth 2 (two) times daily with a meal.   Motegrity 2 MG Tabs Generic drug: Prucalopride Succinate Take 1 tablet by mouth daily.   nitroGLYCERIN 0.4 MG SL tablet Commonly known as: NITROSTAT Place 0.4 mg under the tongue every 5 (five) minutes as needed for chest pain.   nystatin powder Commonly known as: MYCOSTATIN/NYSTOP Apply 1 Application topically daily as needed (itching and yeast).   ondansetron 8 MG disintegrating tablet Commonly known as: ZOFRAN-ODT Take 1 tablet (8 mg total) by mouth 3 (three) times daily before meals.   pantoprazole 40 MG tablet Commonly known as: PROTONIX Take 40 mg by mouth 2 (two) times daily.   prazosin 5 MG capsule Commonly known as: MINIPRESS Take 2 mg by mouth at bedtime.   pregabalin 200 MG capsule Commonly known as: LYRICA Take 200 mg by mouth  3 (three) times daily.   promethazine 6.25 MG/5ML syrup Commonly known as: PHENERGAN Take by mouth.   promethazine 25 MG suppository Commonly known as: PHENERGAN   propranolol 40 MG tablet Commonly known as: INDERAL Take 40 mg by mouth 2 (two) times daily.   Qulipta 60 MG Tabs Generic drug: Atogepant Take 1 tablet by mouth at bedtime.   simvastatin 20 MG tablet Commonly known as: ZOCOR Take 10 mg by mouth daily at 6 PM.   sucralfate 1 g tablet Commonly known as: CARAFATE Take 1 g by mouth 3 (three) times daily.   tadalafil 5 MG tablet Commonly known as: CIALIS Take 1 tablet by mouth daily.   topiramate 100 MG tablet Commonly known as: TOPAMAX Take 100-200 mg by mouth 2 (two) times daily. Take 100 mg in the morning, Take 200 mg at bedtime   Trintellix 20 MG Tabs tablet Generic drug: vortioxetine HBr Take 20 mg by mouth daily.   Trudhesa 0.725 MG/ACT Aers Generic drug: Dihydroergotamine Mesylate HFA Place 2 sprays into the nose daily.   Ubrelvy 100 MG  Tabs Generic drug: Ubrogepant Take 100 mg by mouth daily as needed (migraines).   Vitamin D3 1.25 MG (50000 UT) Caps Take 1 capsule by mouth every Monday, Wednesday, and Friday. 3 times a week   Vraylar 3 MG capsule Generic drug: cariprazine Take 4.5 mg by mouth daily.   Xiidra 5 % Soln Generic drug: Lifitegrast Apply 1 drop to eye 2 (two) times daily.        Follow-up Information     Beola Cord, [place]. Schedule an appointment as soon as possible for a visit in 1 week(s).   Specialty: Family Medicine Contact information: [place] [address] [phone]                Allergies  Allergen Reactions   Ciprofloxacin Hives    Consultations: General surgery   Procedures/Studies: DG Abd Portable 1V-Small Bowel Obstruction Protocol-initial, 8 hr delay  Result Date: 10/13/2022 CLINICAL DATA:  8 hour small-bowel delay.  Small-bowel obstruction. EXAM: PORTABLE  ABDOMEN - 1 VIEW COMPARISON:  Radiograph earlier today.  CT yesterday. FINDINGS: Enteric tube tip and side-port below the diaphragm in the stomach. Enteric contrast is seen throughout the entire colon. Mild gaseous distension of central small bowel, similar. Presacral stimulator in place. IMPRESSION: There is enteric contrast throughout the colon. Mild gaseous distension of central small bowel is similar to prior. Electronically Signed   By: Keith Rake M.D.   On: 10/13/2022 19:28   DG Abd Portable 1V-Small Bowel Protocol-Position Verification  Result Date: 10/13/2022 CLINICAL DATA:  Nasogastric tube placement EXAM: PORTABLE ABDOMEN - 1 VIEW COMPARISON:  Prior abdominal radiographs yesterday FINDINGS: Nasogastric tube in good position with the tip overlying the gastric antrum. Partially imaged port catheter overlying the cavoatrial junction. The lungs are clear. Surgical clips in the right upper quadrant consistent with prior cholecystectomy. IMPRESSION: Well-positioned gastric tube. Electronically Signed   By: Jacqulynn Cadet M.D.   On: 10/13/2022 10:26   DG Chest Port 1 View  Result Date: 10/12/2022 CLINICAL DATA:  NG tube placement; epigastric and chest pain EXAM: PORTABLE CHEST 1 VIEW COMPARISON:  Radiographs earlier today FINDINGS: Interval placement of a enteric tube with tip and side-port in the stomach. Remainder unchanged. Accessed right chest wall Port-A-Cath with tip at the superior cavoatrial junction. Normal cardiomediastinal silhouette. No focal consolidation, pleural effusion, or pneumothorax. No acute osseous abnormality. IMPRESSION: Enteric tube tip and side-port in the stomach. Otherwise no change from earlier today. Electronically Signed   By: Placido Sou M.D.   On: 10/12/2022 21:36   DG Abd Portable 1 View  Result Date: 10/12/2022 CLINICAL DATA:  Evaluate nasogastric tube placement. EXAM: PORTABLE ABDOMEN - 1 VIEW COMPARISON:  April 05, 2019 FINDINGS: A nasogastric tube is  seen with its distal tip overlying the expected region of the gastric fundus. Its distal side hole sits approximately 7.3 cm distal to the expected region of the gastroesophageal junction. Dilated loops of air-filled small bowel are seen within the upper abdomen. A large amount of stool is seen throughout the colon. Radiopaque surgical clips are seen overlying the right upper quadrant. No radio-opaque calculi or other significant radiographic abnormality are seen. IMPRESSION: 1. Nasogastric tube positioning, as described above. 2. Findings consistent with the partial small bowel obstruction seen on the prior abdomen and pelvis CT (dated October 12, 2022). Electronically Signed   By: Virgina Norfolk M.D.   On: 10/12/2022 21:14   CT ABDOMEN PELVIS W CONTRAST  Result Date: 10/12/2022 CLINICAL DATA:  Epigastric pain, nausea, chest pain EXAM: CT ABDOMEN AND PELVIS WITH CONTRAST TECHNIQUE: Multidetector CT imaging of the abdomen and pelvis was performed using the standard protocol following bolus administration of intravenous contrast. RADIATION DOSE REDUCTION: This exam was performed according to the departmental dose-optimization program which includes automated exposure control, adjustment of the mA and/or kV according to patient size and/or use of iterative reconstruction technique. CONTRAST:  188mL OMNIPAQUE IOHEXOL 300 MG/ML  SOLN COMPARISON:  07/21/2022 FINDINGS: Lower chest: Small linear densities in the posterior lower lung fields may suggest minimal subsegmental atelectasis. Tip of central venous catheter is seen in the right atrium. Hepatobiliary: No focal abnormalities are seen in liver. Surgical clips are seen in gallbladder fossa. Pancreas: Pancreas is not distinctly seen. Clinical history suggests previous pancreatic transplant. Spleen: Spleen is not seen. Adrenals/Urinary Tract: Adrenals are unremarkable. There is no hydronephrosis. There are bilateral renal stones largest measuring 8 mm in size in  the lower pole of left kidney. There is no perinephric fluid collection. Ureters are not dilated. Urinary bladder is unremarkable. Stomach/Bowel: Surgical staples are seen in stomach. There is dilation of proximal small bowel loops with proximal jejunum measuring 4 cm in diameter. There is fluid in the lumen of dilated proximal small bowel loops. Distal small bowel loops are decompressed. Exact level of transition is not clearly identified. There is mild fecalization in distal small bowel loops in lower mid abdomen. Appendix is not dilated. There is no significant wall thickening in colon. There is no pericolic stranding. Vascular/Lymphatic: Vascular structures are unremarkable. There are few subcentimeter nodes in mesentery and retroperitoneum. This may suggest benign reactive hyperplasia. Reproductive: Unremarkable. Other: There is no ascites or pneumoperitoneum. Small umbilical hernia containing fat is seen. Musculoskeletal: Schmorl's node is seen in the upper endplate of body of L2 vertebra with no significant change. IMPRESSION: There is dilation of proximal small bowel loops measuring up to 4 cm in diameter. Distal and terminal ileum appear to be decompressed. Findings suggest possible partial small bowel obstruction, possibly due to adhesions. Level of transition is possibly in the lower mid abdomen. There is no hydronephrosis.  Bilateral nonobstructing renal stones. Pancreas is not distinctly visualized. There are no abnormal fluid collections in pancreatic bed. Other findings as described in the body of the report. Electronically Signed   By: Elmer Picker M.D.   On: 10/12/2022 19:36   DG Chest 1 View  Result Date: 10/12/2022 CLINICAL DATA:  Epigastric/abdominal pain. EXAM: CHEST  1 VIEW COMPARISON:  Chest radiograph dated February 23, 2022. FINDINGS: The heart size and mediastinal contours are within normal limits. Both lungs are clear. The visualized skeletal structures are unremarkable. Right IJ  access Port-A-Cath with distal tip in the SVC, unchanged. IMPRESSION: No active disease. Electronically Signed   By: Keane Police D.O.   On: 10/12/2022 18:00     Discharge Exam: Vitals:   10/14/22 0514 10/14/22 0814  BP: 130/75 110/69  Pulse: 64 (!) 49  Resp: 16 18  Temp: 97.8 F (36.6 C) 98.3 F (36.8 C)  SpO2: 97% 97%   Vitals:   10/13/22 1456 10/13/22 1932 10/14/22 0514 10/14/22 0814  BP: 122/79 123/72 130/75 110/69  Pulse: (!) 57 63 64 (!) 49  Resp: 20 18 16 18   Temp: (!) 97.5 F (36.4 C) 97.8 F (36.6 C) 97.8 F (36.6 C) 98.3 F (36.8 C)  TempSrc: Oral Oral Oral Oral  SpO2: 98% 95% 97% 97%  Weight:      Height:  General: Pt is alert, awake, not in acute distress Cardiovascular: RRR, S1/S2 +, no rubs, no gallops Respiratory: CTA bilaterally, no wheezing, no rhonchi Abdominal: Soft, NT, ND, bowel sounds + Extremities: no edema, no cyanosis    The results of significant diagnostics from this hospitalization (including imaging, microbiology, ancillary and laboratory) are listed below for reference.     Microbiology: No results found for this or any previous visit (from the past 240 hour(s)).   Labs: BNP (last 3 results) No results for input(s): "BNP" in the last 8760 hours. Basic Metabolic Panel: Recent Labs  Lab 10/12/22 1739 10/13/22 0357 10/14/22 0417  NA 140 144 143  K 3.2* 3.0* 3.7  CL 113* 115* 114*  CO2 21* 23 22  GLUCOSE 221* 95 160*  BUN 26* 24* 22*  CREATININE 1.23 1.09 1.06  CALCIUM 8.3* 8.3* 8.6*  MG  --  1.9  --   PHOS  --  3.6  --    Liver Function Tests: Recent Labs  Lab 10/12/22 1739 10/13/22 0357  AST 35 27  ALT 36 33  ALKPHOS 104 90  BILITOT 0.3 0.4  PROT 6.0* 5.3*  ALBUMIN 3.6 3.2*   Recent Labs  Lab 10/12/22 1739  LIPASE 20   No results for input(s): "AMMONIA" in the last 168 hours. CBC: Recent Labs  Lab 10/12/22 1739 10/13/22 0357  WBC 18.7* 13.8*  HGB 10.9* 10.2*  HCT 31.4* 29.8*  MCV 93.2 94.0   PLT 120* 102*   Cardiac Enzymes: No results for input(s): "CKTOTAL", "CKMB", "CKMBINDEX", "TROPONINI" in the last 168 hours. BNP: Invalid input(s): "POCBNP" CBG: Recent Labs  Lab 10/13/22 1703 10/13/22 1935 10/14/22 0002 10/14/22 0514 10/14/22 0816  GLUCAP 117* 94 110* 162* 173*   D-Dimer No results for input(s): "DDIMER" in the last 72 hours. Hgb A1c Recent Labs    10/12/22 2306  HGBA1C 10.0*   Lipid Profile No results for input(s): "CHOL", "HDL", "LDLCALC", "TRIG", "CHOLHDL", "LDLDIRECT" in the last 72 hours. Thyroid function studies No results for input(s): "TSH", "T4TOTAL", "T3FREE", "THYROIDAB" in the last 72 hours.  Invalid input(s): "FREET3" Anemia work up No results for input(s): "VITAMINB12", "FOLATE", "FERRITIN", "TIBC", "IRON", "RETICCTPCT" in the last 72 hours. Urinalysis    Component Value Date/Time   COLORURINE YELLOW 10/12/2022 1940   APPEARANCEUR CLEAR 10/12/2022 1940   LABSPEC 1.034 (H) 10/12/2022 1940   PHURINE 5.0 10/12/2022 1940   GLUCOSEU 150 (A) 10/12/2022 1940   HGBUR NEGATIVE 10/12/2022 1940   BILIRUBINUR NEGATIVE 10/12/2022 1940   KETONESUR NEGATIVE 10/12/2022 1940   PROTEINUR NEGATIVE 10/12/2022 1940   NITRITE NEGATIVE 10/12/2022 1940   LEUKOCYTESUR NEGATIVE 10/12/2022 1940   Sepsis Labs Recent Labs  Lab 10/12/22 1739 10/13/22 0357  WBC 18.7* 13.8*   Microbiology No results found for this or any previous visit (from the past 240 hour(s)).   Time coordinating discharge: 35 minutes  SIGNED:   Rodena Goldmann, DO [facility] 10/14/2022, 11:09 AM  If 7PM-7AM, please contact night-coverage [email or web address]

## 2022-10-14 NOTE — Plan of Care (Signed)

## 2022-10-14 NOTE — Progress Notes (Addendum)
Pt  had had small amount of clear liquids with no vomiting so far, d/o feeling a little nauseous. Pt also had large semi-loose BM this shift.

## 2022-10-14 NOTE — Progress Notes (Signed)
[facility] Progress Note     Subjective: Having Bms. KUB with contrast in the colon.   Objective: Vital signs in last 24 hours: Temp:  [97.5 F (36.4 C)-98.3 F (36.8 C)] 98.3 F (36.8 C) (11/05 0814) Pulse Rate:  [49-64] 49 (11/05 0814) Resp:  [16-20] 18 (11/05 0814) BP: (110-130)/(69-79) 110/69 (11/05 0814) SpO2:  [95 %-98 %] 97 % (11/05 0814) Last BM Date : 10/14/22  Intake/Output from previous day: 11/04 0701 - 11/05 0700 In: 662.2 [NG/GT:180; IV Piggyback:482.2] Out: 450 [Urine:400; Emesis/NG output:50] Intake/Output this shift: No intake/output data recorded.  General appearance: alert and no distress Resp: normal work of breathing GI: soft, non-tender; bowel sounds normal; no masses,  no organomegaly  Lab Results:  Recent Labs    10/12/22 1739 10/13/22 0357  WBC 18.7* 13.8*  HGB 10.9* 10.2*  HCT 31.4* 29.8*  PLT 120* 102*   BMET Recent Labs    10/13/22 0357 10/14/22 0417  NA 144 143  K 3.0* 3.7  CL 115* 114*  CO2 23 22  GLUCOSE 95 160*  BUN 24* 22*  CREATININE 1.09 1.06  CALCIUM 8.3* 8.6*   PT/INR No results for input(s): "LABPROT", "INR" in the last 72 hours.  Studies/Results: DG Abd Portable 1V-Small Bowel Obstruction Protocol-initial, 8 hr delay  Result Date: 10/13/2022 CLINICAL DATA:  8 hour small-bowel delay.  Small-bowel obstruction. EXAM: PORTABLE ABDOMEN - 1 VIEW COMPARISON:  Radiograph earlier today.  CT yesterday. FINDINGS: Enteric tube tip and side-port below the diaphragm in the stomach. Enteric contrast is seen throughout the entire colon. Mild gaseous distension of central small bowel, similar. Presacral stimulator in place. IMPRESSION: There is enteric contrast throughout the colon. Mild gaseous distension of central small bowel is similar to prior. Electronically Signed   By: Narda Rutherford M.D.   On: 10/13/2022 19:28   DG Abd Portable 1V-Small Bowel Protocol-Position Verification  Result Date:  10/13/2022 CLINICAL DATA:  Nasogastric tube placement EXAM: PORTABLE ABDOMEN - 1 VIEW COMPARISON:  Prior abdominal radiographs yesterday FINDINGS: Nasogastric tube in good position with the tip overlying the gastric antrum. Partially imaged port catheter overlying the cavoatrial junction. The lungs are clear. Surgical clips in the right upper quadrant consistent with prior cholecystectomy. IMPRESSION: Well-positioned gastric tube. Electronically Signed   By: Malachy Moan M.D.   On: 10/13/2022 10:26   DG Chest Port 1 View  Result Date: 10/12/2022 CLINICAL DATA:  NG tube placement; epigastric and chest pain EXAM: PORTABLE CHEST 1 VIEW COMPARISON:  Radiographs earlier today FINDINGS: Interval placement of a enteric tube with tip and side-port in the stomach. Remainder unchanged. Accessed right chest wall Port-A-Cath with tip at the superior cavoatrial junction. Normal cardiomediastinal silhouette. No focal consolidation, pleural effusion, or pneumothorax. No acute osseous abnormality. IMPRESSION: Enteric tube tip and side-port in the stomach. Otherwise no change from earlier today. Electronically Signed   By: Minerva Fester M.D.   On: 10/12/2022 21:36   DG Abd Portable 1 View  Result Date: 10/12/2022 CLINICAL DATA:  Evaluate nasogastric tube placement. EXAM: PORTABLE ABDOMEN - 1 VIEW COMPARISON:  April 05, 2019 FINDINGS: A nasogastric tube is seen with its distal tip overlying the expected region of the gastric fundus. Its distal side hole sits approximately 7.3 cm distal to the expected region of the gastroesophageal junction. Dilated loops of air-filled small bowel are seen within the upper abdomen. A large amount of stool is seen throughout the colon. Radiopaque surgical clips are seen overlying the right upper quadrant.  No radio-opaque calculi or other significant radiographic abnormality are seen. IMPRESSION: 1. Nasogastric tube positioning, as described above. 2. Findings consistent with the partial  small bowel obstruction seen on the prior abdomen and pelvis CT (dated October 12, 2022). Electronically Signed   By: Virgina Norfolk M.D.   On: 10/12/2022 21:14   CT ABDOMEN PELVIS W CONTRAST  Result Date: 10/12/2022 CLINICAL DATA:  Epigastric pain, nausea, chest pain EXAM: CT ABDOMEN AND PELVIS WITH CONTRAST TECHNIQUE: Multidetector CT imaging of the abdomen and pelvis was performed using the standard protocol following bolus administration of intravenous contrast. RADIATION DOSE REDUCTION: This exam was performed according to the departmental dose-optimization program which includes automated exposure control, adjustment of the mA and/or kV according to patient size and/or use of iterative reconstruction technique. CONTRAST:  142mL OMNIPAQUE IOHEXOL 300 MG/ML  SOLN COMPARISON:  07/21/2022 FINDINGS: Lower chest: Small linear densities in the posterior lower lung fields may suggest minimal subsegmental atelectasis. Tip of central venous catheter is seen in the right atrium. Hepatobiliary: No focal abnormalities are seen in liver. Surgical clips are seen in gallbladder fossa. Pancreas: Pancreas is not distinctly seen. Clinical history suggests previous pancreatic transplant. Spleen: Spleen is not seen. Adrenals/Urinary Tract: Adrenals are unremarkable. There is no hydronephrosis. There are bilateral renal stones largest measuring 8 mm in size in the lower pole of left kidney. There is no perinephric fluid collection. Ureters are not dilated. Urinary bladder is unremarkable. Stomach/Bowel: Surgical staples are seen in stomach. There is dilation of proximal small bowel loops with proximal jejunum measuring 4 cm in diameter. There is fluid in the lumen of dilated proximal small bowel loops. Distal small bowel loops are decompressed. Exact level of transition is not clearly identified. There is mild fecalization in distal small bowel loops in lower mid abdomen. Appendix is not dilated. There is no significant wall  thickening in colon. There is no pericolic stranding. Vascular/Lymphatic: Vascular structures are unremarkable. There are few subcentimeter nodes in mesentery and retroperitoneum. This may suggest benign reactive hyperplasia. Reproductive: Unremarkable. Other: There is no ascites or pneumoperitoneum. Small umbilical hernia containing fat is seen. Musculoskeletal: Schmorl's node is seen in the upper endplate of body of L2 vertebra with no significant change. IMPRESSION: There is dilation of proximal small bowel loops measuring up to 4 cm in diameter. Distal and terminal ileum appear to be decompressed. Findings suggest possible partial small bowel obstruction, possibly due to adhesions. Level of transition is possibly in the lower mid abdomen. There is no hydronephrosis.  Bilateral nonobstructing renal stones. Pancreas is not distinctly visualized. There are no abnormal fluid collections in pancreatic bed. Other findings as described in the body of the report. Electronically Signed   By: Elmer Picker M.D.   On: 10/12/2022 19:36   DG Chest 1 View  Result Date: 10/12/2022 CLINICAL DATA:  Epigastric/abdominal pain. EXAM: CHEST  1 VIEW COMPARISON:  Chest radiograph dated February 23, 2022. FINDINGS: The heart size and mediastinal contours are within normal limits. Both lungs are clear. The visualized skeletal structures are unremarkable. Right IJ access Port-A-Cath with distal tip in the SVC, unchanged. IMPRESSION: No active disease. Electronically Signed   By: Keane Police D.O.   On: 10/12/2022 18:00    Anti-infectives: Anti-infectives (From admission, onward)    None       Assessment/Plan: Patient with pSBO versus just constipation, gastroparesis. Doing well and having Bms. He follows with [facility].   Diet as tolerated CD of imaging needed, we cannot send  his images via PACS or make a CD today Patient should get CD      LOS: 2 days    Lucretia Roers 10/14/2022

## 2023-01-21 ENCOUNTER — Other Ambulatory Visit: Payer: Self-pay

## 2023-01-21 ENCOUNTER — Emergency Department (HOSPITAL_COMMUNITY): Payer: Medicare (Managed Care)

## 2023-01-21 ENCOUNTER — Emergency Department (HOSPITAL_COMMUNITY)
Admission: EM | Admit: 2023-01-21 | Discharge: 2023-01-21 | Disposition: A | Discharge status: Home / Self Care | Payer: Medicare (Managed Care) | Attending: Emergency Medicine | Admitting: Emergency Medicine

## 2023-01-21 ENCOUNTER — Encounter (HOSPITAL_COMMUNITY): Payer: Self-pay | Admitting: Emergency Medicine

## 2023-01-21 DIAGNOSIS — Z8673 Personal history of transient ischemic attack (TIA), and cerebral infarction without residual deficits: Secondary | ICD-10-CM | POA: Diagnosis not present

## 2023-01-21 DIAGNOSIS — Z9049 Acquired absence of other specified parts of digestive tract: Secondary | ICD-10-CM | POA: Diagnosis not present

## 2023-01-21 DIAGNOSIS — I10 Essential (primary) hypertension: Secondary | ICD-10-CM | POA: Insufficient documentation

## 2023-01-21 DIAGNOSIS — E1165 Type 2 diabetes mellitus with hyperglycemia: Secondary | ICD-10-CM | POA: Insufficient documentation

## 2023-01-21 DIAGNOSIS — R519 Headache, unspecified: Secondary | ICD-10-CM | POA: Insufficient documentation

## 2023-01-21 DIAGNOSIS — Z9081 Acquired absence of spleen: Secondary | ICD-10-CM | POA: Diagnosis not present

## 2023-01-21 DIAGNOSIS — E860 Dehydration: Secondary | ICD-10-CM | POA: Diagnosis not present

## 2023-01-21 DIAGNOSIS — N200 Calculus of kidney: Secondary | ICD-10-CM | POA: Diagnosis not present

## 2023-01-21 DIAGNOSIS — Z79899 Other long term (current) drug therapy: Secondary | ICD-10-CM | POA: Insufficient documentation

## 2023-01-21 DIAGNOSIS — Z794 Long term (current) use of insulin: Secondary | ICD-10-CM | POA: Insufficient documentation

## 2023-01-21 LAB — COMPREHENSIVE METABOLIC PANEL
ALT: 28 U/L (ref 0–44)
AST: 30 U/L (ref 15–41)
Albumin: 4 g/dL (ref 3.5–5.0)
Alkaline Phosphatase: 133 U/L — ABNORMAL HIGH (ref 38–126)
Anion gap: 9 (ref 5–15)
BUN: 26 mg/dL — ABNORMAL HIGH (ref 6–20)
CO2: 20 mmol/L — ABNORMAL LOW (ref 22–32)
Calcium: 9.4 mg/dL (ref 8.9–10.3)
Chloride: 111 mmol/L (ref 98–111)
Creatinine, Ser: 1.47 mg/dL — ABNORMAL HIGH (ref 0.61–1.24)
GFR, Estimated: >60 mL/min (ref >60)
Glucose, Bld: 242 mg/dL — ABNORMAL HIGH (ref 70–99)
Potassium: 4 mmol/L (ref 3.5–5.1)
Sodium: 140 mmol/L (ref 135–145)
Total Bilirubin: 0.6 mg/dL (ref 0.3–1.2)
Total Protein: 6.5 g/dL (ref 6.5–8.1)

## 2023-01-21 LAB — URINALYSIS, ROUTINE W REFLEX MICROSCOPIC
Bacteria, UA: NONE SEEN
Bilirubin Urine: NEGATIVE
Glucose, UA: >=500 mg/dL — AB
Ketones, ur: NEGATIVE mg/dL
Leukocytes,Ua: NEGATIVE
Nitrite: NEGATIVE
Protein, ur: NEGATIVE mg/dL
Specific Gravity, Urine: 1.029 (ref 1.005–1.030)
pH: 6 (ref 5.0–8.0)

## 2023-01-21 LAB — BLOOD GAS, VENOUS
Acid-base deficit: 2.8 mmol/L — ABNORMAL HIGH (ref 0.0–2.0)
Bicarbonate: 22.6 mmol/L (ref 20.0–28.0)
Drawn by: 40707
O2 Saturation: 68.5 %
Patient temperature: 36.4
pCO2, Ven: 40 mmHg — ABNORMAL LOW (ref 44–60)
pH, Ven: 7.36 (ref 7.25–7.43)
pO2, Ven: 38 mmHg (ref 32–45)

## 2023-01-21 LAB — CBC
HCT: 34.4 % — ABNORMAL LOW (ref 39.0–52.0)
Hemoglobin: 11.4 g/dL — ABNORMAL LOW (ref 13.0–17.0)
MCH: 31.8 pg (ref 26.0–34.0)
MCHC: 33.1 g/dL (ref 30.0–36.0)
MCV: 96.1 fL (ref 80.0–100.0)
Platelets: 113 10*3/uL — ABNORMAL LOW (ref 150–400)
RBC: 3.58 MIL/uL — ABNORMAL LOW (ref 4.22–5.81)
RDW: 13.2 % (ref 11.5–15.5)
WBC: 12.3 10*3/uL — ABNORMAL HIGH (ref 4.0–10.5)
nRBC: 0 % (ref 0.0–0.2)

## 2023-01-21 LAB — LACTIC ACID, PLASMA
Lactic Acid, Venous: 1.9 mmol/L (ref 0.5–1.9)
Lactic Acid, Venous: 1.7 mmol/L (ref 0.5–1.9)

## 2023-01-21 LAB — LIPASE, BLOOD: Lipase: 22 U/L (ref 11–51)

## 2023-01-21 LAB — BETA-HYDROXYBUTYRIC ACID: Beta-Hydroxybutyric Acid: 0.13 mmol/L (ref 0.05–0.27)

## 2023-01-21 LAB — CBG MONITORING, ED
Glucose-Capillary: 244 mg/dL — ABNORMAL HIGH (ref 70–99)
Glucose-Capillary: 151 mg/dL — ABNORMAL HIGH (ref 70–99)

## 2023-01-21 MED ORDER — TOPIRAMATE 25 MG PO TABS
200.0000 mg | ORAL_TABLET | Freq: Once | ORAL | Status: AC
  Administered 2023-01-21: 200 mg via ORAL
  Filled 2023-01-21: qty 8

## 2023-01-21 MED ORDER — METOCLOPRAMIDE HCL 5 MG/ML IJ SOLN
10.0000 mg | Freq: Once | INTRAMUSCULAR | Status: AC
  Administered 2023-01-21: 10 mg via INTRAVENOUS
  Filled 2023-01-21: qty 2

## 2023-01-21 MED ORDER — HEPARIN SOD (PORK) LOCK FLUSH 100 UNIT/ML IV SOLN
INTRAVENOUS | Status: AC
  Administered 2023-01-21: 500 [IU]
  Filled 2023-01-21: qty 5

## 2023-01-21 MED ORDER — IOHEXOL 300 MG/ML  SOLN
80.0000 mL | Freq: Once | INTRAMUSCULAR | Status: AC | PRN
  Administered 2023-01-21: 80 mL via INTRAVENOUS

## 2023-01-21 MED ORDER — LACTATED RINGERS IV BOLUS
2000.0000 mL | Freq: Once | INTRAVENOUS | Status: AC
  Administered 2023-01-21: 2000 mL via INTRAVENOUS

## 2023-01-21 MED ORDER — LEVETIRACETAM 500 MG PO TABS
750.0000 mg | ORAL_TABLET | Freq: Once | ORAL | Status: AC
  Administered 2023-01-21: 750 mg via ORAL
  Filled 2023-01-21: qty 1

## 2023-01-21 NOTE — ED Notes (Signed)
Asked pt about urine sample, states he is unable to at the moment but will try.

## 2023-01-21 NOTE — Discharge Instructions (Signed)
Your lab work today shows decreased kidney function. This is likely due to dehydration from your elevated blood sugars over the past several days.  Continue to drink plenty of fluids at home.  Continue to utilize insulin to maintain good sugar control.  Continue your home medications as prescribed.  When you see your neurologist next week, discuss your recent symptoms and see if he does want to increase dosing on your seizure prevention medications.  Return the emergency department at any time for any new or worsening symptoms of concern.

## 2023-01-21 NOTE — ED Provider Notes (Signed)
[facility] Provider Note   CSN: [id number] Arrival date & time: 01/21/23  1509     History  Chief Complaint  Patient presents with   Hyperglycemia    Dennis Yang is a 37 y.o. male.   Hyperglycemia Associated symptoms: abdominal pain, confusion, fatigue and nausea   Patient presents for multiple complaints.  Medical history includes anxiety, bipolar disorder, depression, DM, gastroparesis, arthritis, pancreatitis, TIA, HTN, migraines, seizures.  He has been adherent to his home insulin.  Despite this, he has had elevated blood glucose readings at home for the past 4 days.  Today, he woke up from a nap and felt confused.  Additional symptoms were upper abdominal pain, frontal headache, and nausea.  He feels that the confusion lasted for 2 hours.  He did take his long-acting insulin last night.  Last dose of insulin was 2 PM today, when he took 40 units of short acting.       Home Medications Prior to Admission medications   Medication Sig Start Date End Date Taking? Authorizing Provider  acetaminophen (TYLENOL) 325 MG tablet Take 2 tablets (650 mg total) by mouth every 6 (six) hours as needed for mild pain, fever or headache (or Fever >/= 101). 08/13/19   Emokpae, Courage, MD  amitriptyline (ELAVIL) 10 MG tablet Take 1 tablet by mouth at bedtime. 09/19/22   [provider]  anagrelide (AGRYLIN) 1 MG capsule Take 1 mg by mouth in the morning and at bedtime. 05/27/20   [provider]  ARIPiprazole (ABILIFY) 20 MG tablet Take 20 mg by mouth at bedtime. 07/11/21   [provider]  ATROVENT HFA 17 MCG/ACT inhaler Inhale 1 puff into the lungs every 4 (four) hours as needed for wheezing.  01/16/19   [provider]  Cholecalciferol (VITAMIN D3) 1.25 MG (50000 UT) CAPS Take 1 capsule by mouth every Monday, Wednesday, and Friday. 3 times a week 12/24/18   [provider]  CREON 36000 units CPEP capsule Take  [id number] Units by mouth See admin instructions. Take 2 capsules by mouth with meals and 1 capsule with snacks 11/16/19   [provider]  cyclobenzaprine (FLEXERIL) 10 MG tablet Take 10 mg by mouth 3 (three) times daily as needed for muscle spasms. 04/19/20   [provider]  diclofenac Sodium (VOLTAREN) 1 % GEL Apply 2 g topically daily as needed (for pain).  11/23/19   [provider]  dicyclomine (BENTYL) 20 MG tablet Take 20 mg by mouth 4 (four) times daily as needed.    [provider]  Docusate Sodium (DSS) 100 MG CAPS Take 1 capsule by mouth daily.    [provider]  emtricitabine-tenofovir (TRUVADA) 200-300 MG tablet Take 1 tablet by mouth daily.    [provider]  eszopiclone (LUNESTA) 2 MG TABS tablet Take 1 tablet by mouth daily with breakfast. 07/05/22   [provider]  FEROSUL 325 (65 Fe) MG tablet Take 325 mg by mouth daily. 07/30/22   [provider]  fluticasone (FLONASE) 50 MCG/ACT nasal spray Place 1 spray into both nostrils daily.  01/20/19   [provider]  Glucagon (GVOKE HYPOPEN 2-PACK) 0.5 MG/0.1ML SOAJ Inject 0.5 mg into the skin as needed (low blood sugar).    [provider]  hydrochlorothiazide (HYDRODIURIL) 12.5 MG tablet Take 12.5 mg by mouth daily.    [provider]  hydrOXYzine (ATARAX/VISTARIL) 25 MG tablet Take 25 mg by mouth 4 (four) times  daily. 07/11/21   [provider]  IBU 800 MG tablet Take 800 mg by mouth every 8 (eight) hours as needed for moderate pain.    [provider]  LANTUS SOLOSTAR 100 UNIT/ML Solostar Pen 16 Units at bedtime. 07/04/22   [provider]  levETIRAcetam (KEPPRA) 750 MG tablet Take 750 mg by mouth in the morning and at bedtime.    [provider]  Lifitegrast Shirley Friar) 5 % SOLN Apply 1 drop to eye 2 (two) times daily.    [provider]  meclizine (ANTIVERT) 25 MG tablet Take 25 mg by mouth  every 6 (six) hours as needed for dizziness or nausea.  10/01/19   [provider]  meloxicam (MOBIC) 15 MG tablet Take 15 mg by mouth daily. 07/13/20   [provider]  methenamine (HIPREX) 1 g tablet Take 1 g by mouth 2 (two) times daily with a meal.    [provider]  MOTEGRITY 2 MG TABS Take 1 tablet by mouth daily.    [provider]  nitroGLYCERIN (NITROSTAT) 0.4 MG SL tablet Place 0.4 mg under the tongue every 5 (five) minutes as needed for chest pain.  08/18/19   [provider]  nystatin (MYCOSTATIN/NYSTOP) powder Apply 1 Application topically daily as needed (itching and yeast).    [provider]  ondansetron (ZOFRAN-ODT) 8 MG disintegrating tablet Take 1 tablet (8 mg total) by mouth 3 (three) times daily before meals. 07/18/21   Johnson, Clanford L, MD  pantoprazole (PROTONIX) 40 MG tablet Take 40 mg by mouth 2 (two) times daily.    [provider]  prazosin (MINIPRESS) 5 MG capsule Take 2 mg by mouth at bedtime. 06/30/21   [provider]  pregabalin (LYRICA) 200 MG capsule Take 200 mg by mouth 3 (three) times daily.    [provider]  promethazine (PHENERGAN) 25 MG suppository     [provider]  promethazine (PHENERGAN) 6.25 MG/5ML syrup Take by mouth.    [provider]  propranolol (INDERAL) 40 MG tablet Take 40 mg by mouth 2 (two) times daily.  01/12/19   [provider]  QULIPTA 60 MG TABS Take 1 tablet by mouth at bedtime.    [provider]  simvastatin (ZOCOR) 20 MG tablet Take 10 mg by mouth daily at 6 PM.  12/25/18   [provider]  sucralfate (CARAFATE) 1 g tablet Take 1 g by mouth 3 (three) times daily. 07/11/21   [provider]  tadalafil (CIALIS) 5 MG tablet Take 1 tablet by mouth daily.    [provider]  topiramate (TOPAMAX) 100 MG tablet Take 100-200 mg by mouth 2 (two) times daily. Take 100 mg in the morning, Take 200 mg at bedtime  05/18/19   [provider]  TRINTELLIX 20 MG TABS tablet Take 20 mg by mouth daily. 01/12/22   [provider]  TRUDHESA 0.725 MG/ACT AERS Place 2 sprays into the nose daily.    [provider]  UBRELVY 100 MG TABS Take 100 mg by mouth daily as needed (migraines). 07/11/21   [provider]  Vibegron (GEMTESA) 75 MG TABS Take 75 mg by mouth daily.    [provider]  VRAYLAR 3 MG capsule Take 4.5 mg by mouth daily. 02/01/22   [provider]      Allergies    Ciprofloxacin    Review of Systems   Review of Systems  Constitutional:  Positive for appetite change  and fatigue.  Gastrointestinal:  Positive for abdominal pain, diarrhea and nausea.  Neurological:  Positive for headaches.  Psychiatric/Behavioral:  Positive for confusion.   All other systems reviewed and are negative.   Physical Exam Updated Vital Signs BP 112/77   Pulse (!) 59   Temp 97.6 F (36.4 C) (Oral)   Resp 13   Ht 6' (1.829 m)   Wt 83.9 kg   SpO2 100%   BMI 25.09 kg/m  Physical Exam Vitals and nursing note reviewed.  Constitutional:      General: He is not in acute distress.    Appearance: Normal appearance. He is well-developed. He is not ill-appearing, toxic-appearing or diaphoretic.  HENT:     Head: Normocephalic and atraumatic.     Right Ear: External ear normal.     Left Ear: External ear normal.     Nose: Nose normal.     Mouth/Throat:     Mouth: Mucous membranes are moist.  Eyes:     Extraocular Movements: Extraocular movements intact.     Conjunctiva/sclera: Conjunctivae normal.  Cardiovascular:     Rate and Rhythm: Normal rate and regular rhythm.     Heart sounds: No murmur heard. Pulmonary:     Effort: Pulmonary effort is normal. No respiratory distress.     Breath sounds: Normal breath sounds.  Abdominal:     General: There is no distension.     Palpations: Abdomen is soft.     Tenderness: There is abdominal tenderness. There is no  guarding or rebound.  Musculoskeletal:        General: No swelling. Normal range of motion.     Cervical back: Normal range of motion and neck supple.     Right lower leg: No edema.     Left lower leg: No edema.  Skin:    General: Skin is warm and dry.     Coloration: Skin is not jaundiced or pale.  Neurological:     General: No focal deficit present.     Mental Status: He is alert and oriented to person, place, and time.     Cranial Nerves: No cranial nerve deficit.     Sensory: No sensory deficit.     Motor: No weakness.     Coordination: Coordination normal.  Psychiatric:        Mood and Affect: Mood normal.        Behavior: Behavior is slowed and withdrawn. Behavior is cooperative.        Thought Content: Thought content normal.        Judgment: Judgment normal.     ED Results / Procedures / Treatments   Labs (all labs ordered are listed, but only abnormal results are displayed) Labs Reviewed  CBC - Abnormal; Notable for the following components:      Result Value   WBC 12.3 (*)    RBC 3.58 (*)    Hemoglobin 11.4 (*)    HCT 34.4 (*)    Platelets 113 (*)    All other components within normal limits  URINALYSIS, ROUTINE W REFLEX MICROSCOPIC - Abnormal; Notable for the following components:   Glucose, UA >=500 (*)    Hgb urine dipstick SMALL (*)    All other components within normal limits  COMPREHENSIVE METABOLIC PANEL - Abnormal; Notable for the following components:   CO2 20 (*)    Glucose, Bld 242 (*)    BUN 26 (*)    Creatinine, Ser 1.47 (*)    Alkaline  Phosphatase 133 (*)    All other components within normal limits  BLOOD GAS, VENOUS - Abnormal; Notable for the following components:   pCO2, Ven 40 (*)    Acid-base deficit 2.8 (*)    All other components within normal limits  CBG MONITORING, ED - Abnormal; Notable for the following components:   Glucose-Capillary 244 (*)    All other components within normal limits  CBG MONITORING, ED - Abnormal; Notable  for the following components:   Glucose-Capillary 151 (*)    All other components within normal limits  LACTIC ACID, PLASMA  LACTIC ACID, PLASMA  BETA-HYDROXYBUTYRIC ACID  LIPASE, BLOOD    EKG None  Radiology CT Head Wo Contrast  Result Date: 01/21/2023 CLINICAL DATA:  Mental status change of unknown causes. Worsening confusion. Diabetic. EXAM: CT HEAD WITHOUT CONTRAST TECHNIQUE: Contiguous axial images were obtained from the base of the skull through the vertex without intravenous contrast. RADIATION DOSE REDUCTION: This exam was performed according to the departmental dose-optimization program which includes automated exposure control, adjustment of the mA and/or kV according to patient size and/or use of iterative reconstruction technique. COMPARISON:  03/10/2022 FINDINGS: Brain: The brain shows a normal appearance without evidence of malformation, atrophy, old or acute small or large vessel infarction, mass lesion, hemorrhage, hydrocephalus or extra-axial collection. Vascular: No hyperdense vessel. No evidence of atherosclerotic calcification. Skull: Normal.  No traumatic finding.  No focal bone lesion. Sinuses/Orbits: Sinuses are clear. Orbits appear normal. Mastoids are clear. Other: None significant IMPRESSION: Normal head CT. Electronically Signed   By: Nelson Chimes M.D.   On: 01/21/2023 19:27   CT ABDOMEN PELVIS W CONTRAST  Result Date: 01/21/2023 CLINICAL DATA:  Abdominal pain, acute, nonlocalized. History of pancreatitis. Hyperglycemia. EXAM: CT ABDOMEN AND PELVIS WITH CONTRAST TECHNIQUE: Multidetector CT imaging of the abdomen and pelvis was performed using the standard protocol following bolus administration of intravenous contrast. RADIATION DOSE REDUCTION: This exam was performed according to the departmental dose-optimization program which includes automated exposure control, adjustment of the mA and/or kV according to patient size and/or use of iterative reconstruction technique.  CONTRAST:  34m OMNIPAQUE IOHEXOL 300 MG/ML  SOLN COMPARISON:  10/12/2022 FINDINGS: Lower chest: Lung bases are clear.  No pleural or pericardial fluid. Hepatobiliary: Liver parenchyma is normal. Previous cholecystectomy. Pancreas: No identifiable pancreatic tissue. Pancreatic tissue was last seen to be approximally normal in 2020. 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 No acute pancreatic pathology is discernible. Spleen: Previous splenectomy. Adrenals/Urinary Tract: Adrenal glands are normal. Bilateral nonobstructing renal calculi, the largest in the lower pole on the left measuring 1 cm in size. No evidence of hydronephrosis or passing stone. Stomach/Bowel: Probable previous gastrojejunostomy. No bowel obstruction presently. Hyperdense material in the appendix, but no CT evidence of appendicitis. No acute colon finding. Vascular/Lymphatic: Aorta and IVC are normal.  No adenopathy. Reproductive: Normal Other: No free fluid or air. Musculoskeletal: Minimal lower lumbar degenerative changes. IMPRESSION: 1. No acute finding to explain the clinical presentation. 2. Previous cholecystectomy, splenectomy and some sort of subsequent transplant, by history. 3. Bilateral nonobstructing renal calculi, the largest in the lower pole on the left measuring 1 cm in size. 4. No evidence of bowel obstruction or inflammation. Hyperdense material in the appendix, but no CT evidence of appendicitis. Previous gastrojejunostomy without visible complicating feature. Electronically Signed   By: MNelson ChimesM.D.   On: 01/21/2023 19:26    Procedures Procedures    Medications Ordered in ED  Medications  levETIRAcetam (KEPPRA) tablet 750 mg (has no administration in time range)  topiramate (TOPAMAX) tablet 200 mg (has no administration in time range)  lactated ringers bolus 2,000 mL (0 mLs Intravenous Stopped 01/21/23 2110)  metoCLOPramide (REGLAN) injection 10 mg (10 mg Intravenous  Given 01/21/23 1836)  iohexol (OMNIPAQUE) 300 MG/ML solution 80 mL (80 mLs Intravenous Contrast Given 01/21/23 1904)    ED Course/ Medical Decision Making/ A&P                             Medical Decision Making Amount and/or Complexity of Data Reviewed Labs: ordered. Radiology: ordered.  Risk Prescription drug management.   This patient presents to the ED for concern of multiple complaints, this involves an extensive number of treatment options, and is a complaint that carries with it a high risk of complications and morbidity.  The differential diagnosis includes dehydration, DKA, infection, polypharmacy, seizure   Co morbidities that complicate the patient evaluation  anxiety, bipolar disorder, depression, DM, gastroparesis, arthritis, pancreatitis, TIA, HTN, migraines, seizures   Additional history obtained:  Additional history obtained from N/A External records from outside source obtained and reviewed including EMR   Lab Tests:  I Ordered, and personally interpreted labs.  The pertinent results include: AKI is present.  There is mild hyperglycemia without evidence of DKA.  Lactate is normal.  Hemoglobin is baseline.  There is a mild leukocytosis was also appears to be baseline.  Urinalysis showed no evidence of infection.   Imaging Studies ordered:  I ordered imaging studies including CT head, abdomen, pelvis I independently visualized and interpreted imaging which showed no acute findings to explain recent abdominal discomfort I agree with the radiologist interpretation   Cardiac Monitoring: / EKG:  The patient was maintained on a cardiac monitor.  I personally viewed and interpreted the cardiac monitored which showed an underlying rhythm of: Sinus rhythm  Problem List / ED Course / Critical interventions / Medication management  Patient presents for multiple complaints.  Most of these began today, after he took a nap.  He endorses confusion, headache, abdominal  pain.  He does have a seizure history and may have had a seizure prior to the onset of the symptoms.  He also states that his sugars have been elevated over the past several days.  Prior to being bedded in the ED, laboratory workup was initiated.  Lab results are notable for AKI.  I suspect this is prerenal from his persistently elevated sugars for the past 4 days.  2 L of IV fluid were ordered.  Initial glucose was in the range of 240.  No anion gap is present on CMP.  Per chart review, patient is prescribed Keppra and Topamax for seizure prophylaxis.  He did take his doses this morning.  Reglan was ordered for nausea.  Patient had improved symptoms with IV fluids.  His lab work is notable for an AKI.  I suspect this is from polyuria from hyperglycemia over the past several days.  Fortunately, his glucose is now only slightly elevated.  Lab work shows no evidence of DKA.  CT imaging of head, abdomen, pelvis are reassuring.  I spoke with him about having a possible seizure today.  He does feel that he did have a seizure today and thinks he may have had 1 last night as well.  Triggers for breakthrough seizures, besides the dehydration and hyperglycemia, include poor sleep and THC vaping.  Patient  was given his evening AEDs.  Currently, he has a neurology appointment scheduled in 8 days.  He was advised to continue to stay hydrated at home, to continue to optimize his blood sugars and nightly sleep.  For now, will maintain on current doses of AEDs but he was advised to discuss this with his neurologist.  Patient was discharged in stable condition. I ordered medication including IV fluids for hydration; Reglan for nausea; Keppra and Topamax for seizure prophylaxis Reevaluation of the patient after these medicines showed that the patient improved I have reviewed the patients home medicines and have made adjustments as needed   Social Determinants of Health:  Has access to outpatient care, including close  follow-ups with endocrinology and neurology        Final Clinical Impression(s) / ED Diagnoses Final diagnoses:  Dehydration    Rx / DC Orders ED Discharge Orders     None         Godfrey Pick, MD 01/21/23 2111

## 2023-01-21 NOTE — ED Triage Notes (Signed)
Pt via POV c/o hyperglycemia with CBG >400 per Dexcom at home and unreadable x 2 days consecutively. Pt says he has been more confused and thinks he may be going into DKA. C/O 8/10 LUQ abdominal pain radiating to back. Pt is type 1 diabetic, stage 2 renal disease, allen cell transplant, splenectomy, numerous abdominal surgeries. He appears unwell in triage.

## 2023-01-21 NOTE — ED Provider Triage Note (Signed)
Emergency Medicine Provider Triage Evaluation Note  Dennis Yang , a 37 y.o. male  was evaluated in triage.  He has a history of type 2 diabetes, chronic/recurrent pancreatitis, hypertension, pancreatectomy with auto islet cell transplant pt complains of hypoglycemia x 2 days.  He has a Dexcom monitor and states his blood sugars have been greater than 400 despite taking insulin.  At times his Dexcom has been unreadable.  He also endorses increased confusion x 2 days.  He took a nap earlier today and states he woke up and could not recall any events where he was at or what day of the week it was.  He complains of persistent sharp pain of his abdomen.  He also is having some intermittent episodes of brown watery stools, no hematemesis or melena.  Denies any vomiting fever or chills.  He is concerned he is in  DKA before and he is concerned that he is in DKA again.  Review of Systems  Positive: Abdominal pain, diarrhea, hypoglycemia, confusion Negative: Fever, vomiting, shortness of breath, chest pain  Physical Exam  BP 91/64   Pulse 79   Temp 97.6 F (36.4 C) (Oral)   Resp 16   Ht 6' (1.829 m)   Wt 83.9 kg   SpO2 98%   BMI 25.09 kg/m  Gen:   Uncomfortable appearing  resp:  Normal effort  MSK:   Moves extremities without difficulty  Other:  Mucous membranes slightly dry, diffuse tenderness to mid abdomen  Medical Decision Making  Medically screening exam initiated at 4:15 PM.  Appropriate orders placed.  Dennis Yang was informed that the remainder of the evaluation will be completed by another provider, this initial triage assessment does not replace that evaluation, and the importance of remaining in the ED until their evaluation is complete.     Kem Parkinson, PA-C 01/21/23 1622

## 2023-02-06 ENCOUNTER — Emergency Department (HOSPITAL_COMMUNITY): Payer: Medicare (Managed Care)

## 2023-02-06 ENCOUNTER — Encounter (HOSPITAL_COMMUNITY): Payer: Self-pay

## 2023-02-06 ENCOUNTER — Other Ambulatory Visit: Payer: Self-pay

## 2023-02-06 ENCOUNTER — Emergency Department (HOSPITAL_COMMUNITY)
Admission: EM | Admit: 2023-02-06 | Discharge: 2023-02-06 | Disposition: A | Discharge status: Home / Self Care | Payer: Medicare (Managed Care) | Attending: Emergency Medicine | Admitting: Emergency Medicine

## 2023-02-06 DIAGNOSIS — G5791 Unspecified mononeuropathy of right lower limb: Secondary | ICD-10-CM | POA: Insufficient documentation

## 2023-02-06 DIAGNOSIS — E119 Type 2 diabetes mellitus without complications: Secondary | ICD-10-CM | POA: Diagnosis not present

## 2023-02-06 DIAGNOSIS — G5792 Unspecified mononeuropathy of left lower limb: Secondary | ICD-10-CM | POA: Insufficient documentation

## 2023-02-06 DIAGNOSIS — Z794 Long term (current) use of insulin: Secondary | ICD-10-CM | POA: Diagnosis not present

## 2023-02-06 DIAGNOSIS — G629 Polyneuropathy, unspecified: Secondary | ICD-10-CM

## 2023-02-06 LAB — COMPREHENSIVE METABOLIC PANEL
ALT: 30 U/L (ref 0–44)
AST: 21 U/L (ref 15–41)
Albumin: 4 g/dL (ref 3.5–5.0)
Alkaline Phosphatase: 122 U/L (ref 38–126)
Anion gap: 8 (ref 5–15)
BUN: 27 mg/dL — ABNORMAL HIGH (ref 6–20)
CO2: 21 mmol/L — ABNORMAL LOW (ref 22–32)
Calcium: 9.1 mg/dL (ref 8.9–10.3)
Chloride: 108 mmol/L (ref 98–111)
Creatinine, Ser: 1.08 mg/dL (ref 0.61–1.24)
GFR, Estimated: >60 mL/min (ref >60)
Glucose, Bld: 416 mg/dL — ABNORMAL HIGH (ref 70–99)
Potassium: 4.2 mmol/L (ref 3.5–5.1)
Sodium: 137 mmol/L (ref 135–145)
Total Bilirubin: 0.7 mg/dL (ref 0.3–1.2)
Total Protein: 6.8 g/dL (ref 6.5–8.1)

## 2023-02-06 LAB — CBC WITH DIFFERENTIAL/PLATELET
Abs Immature Granulocytes: 0.02 10*3/uL (ref 0.00–0.07)
Basophils Absolute: 0.2 10*3/uL — ABNORMAL HIGH (ref 0.0–0.1)
Basophils Relative: 2 %
Eosinophils Absolute: 0.2 10*3/uL (ref 0.0–0.5)
Eosinophils Relative: 2 %
HCT: 35.4 % — ABNORMAL LOW (ref 39.0–52.0)
Hemoglobin: 11.7 g/dL — ABNORMAL LOW (ref 13.0–17.0)
Immature Granulocytes: 0 %
Lymphocytes Relative: 41 %
Lymphs Abs: 3.9 10*3/uL (ref 0.7–4.0)
MCH: 32.1 pg (ref 26.0–34.0)
MCHC: 33.1 g/dL (ref 30.0–36.0)
MCV: 97.3 fL (ref 80.0–100.0)
Monocytes Absolute: 1.1 10*3/uL — ABNORMAL HIGH (ref 0.1–1.0)
Monocytes Relative: 11 %
Neutro Abs: 4.1 10*3/uL (ref 1.7–7.7)
Neutrophils Relative %: 44 %
Platelets: 148 10*3/uL — ABNORMAL LOW (ref 150–400)
RBC: 3.64 MIL/uL — ABNORMAL LOW (ref 4.22–5.81)
RDW: 13.6 % (ref 11.5–15.5)
WBC: 9.5 10*3/uL (ref 4.0–10.5)
nRBC: 0 % (ref 0.0–0.2)

## 2023-02-06 MED ORDER — INSULIN ASPART 100 UNIT/ML IJ SOLN
10.0000 [IU] | Freq: Once | INTRAMUSCULAR | Status: AC
  Administered 2023-02-06: 10 [IU] via SUBCUTANEOUS
  Filled 2023-02-06: qty 1

## 2023-02-06 NOTE — Discharge Instructions (Addendum)
Check your glucose tonight and adjust your insulin by your sliding scale.  See your Physicain for recheck

## 2023-02-06 NOTE — ED Provider Notes (Signed)
[facility] Provider Note   CSN: [id number] Arrival date & time: 02/06/23  1055     History  Chief Complaint  Patient presents with   Leg Swelling    Dennis Yang is a 37 y.o. male.  Patient complains of chronic pain in bilateral feet.  Patient reports he is wearing a boot on his left foot after seeing a podiatrist and being told that the bones in his foot were soft because of his diabetes.  Patient reports he now has swelling in both of his lower legs.  Patient reports he is having pain in his right leg as well.  Patient reports he has been diagnosed with neuropathy and is on medication for neuropathy.  Patient states he was told that he needed to have vein studies.  Patient denies any current open wounds.  Patient is insulin-dependent diabetic  The history is provided by the patient. No language interpreter was used.       Home Medications Prior to Admission medications   Medication Sig Start Date End Date Taking? Authorizing Provider  acetaminophen (TYLENOL) 325 MG tablet Take 2 tablets (650 mg total) by mouth every 6 (six) hours as needed for mild pain, fever or headache (or Fever >/= 101). 08/13/19   Emokpae, Courage, MD  amitriptyline (ELAVIL) 10 MG tablet Take 1 tablet by mouth at bedtime. 09/19/22   [provider]  anagrelide (AGRYLIN) 1 MG capsule Take 1 mg by mouth in the morning and at bedtime. 05/27/20   [provider]  ARIPiprazole (ABILIFY) 20 MG tablet Take 20 mg by mouth at bedtime. 07/11/21   [provider]  ATROVENT HFA 17 MCG/ACT inhaler Inhale 1 puff into the lungs every 4 (four) hours as needed for wheezing.  01/16/19   [provider]  Cholecalciferol (VITAMIN D3) 1.25 MG (50000 UT) CAPS Take 1 capsule by mouth every Monday, Wednesday, and Friday. 3 times a week 12/24/18   [provider]  CREON 36000 units CPEP capsule Take [id number] Units by mouth See admin instructions.  Take 2 capsules by mouth with meals and 1 capsule with snacks 11/16/19   [provider]  cyclobenzaprine (FLEXERIL) 10 MG tablet Take 10 mg by mouth 3 (three) times daily as needed for muscle spasms. 04/19/20   [provider]  diclofenac Sodium (VOLTAREN) 1 % GEL Apply 2 g topically daily as needed (for pain).  11/23/19   [provider]  dicyclomine (BENTYL) 20 MG tablet Take 20 mg by mouth 4 (four) times daily as needed.    [provider]  Docusate Sodium (DSS) 100 MG CAPS Take 1 capsule by mouth daily.    [provider]  emtricitabine-tenofovir (TRUVADA) 200-300 MG tablet Take 1 tablet by mouth daily.    [provider]  eszopiclone (LUNESTA) 2 MG TABS tablet Take 1 tablet by mouth daily with breakfast. 07/05/22   [provider]  FEROSUL 325 (65 Fe) MG tablet Take 325 mg by mouth daily. 07/30/22   [provider]  fluticasone (FLONASE) 50 MCG/ACT nasal spray Place 1 spray into both nostrils daily.  01/20/19   [provider]  Glucagon (GVOKE HYPOPEN 2-PACK) 0.5 MG/0.1ML SOAJ Inject 0.5 mg into the skin as needed (low blood sugar).    [provider]  hydrochlorothiazide (HYDRODIURIL) 12.5 MG tablet Take 12.5 mg by mouth daily.    [provider]  hydrOXYzine (ATARAX/VISTARIL) 25 MG tablet Take 25 mg by mouth 4 (  four) times daily. 07/11/21   [provider]  IBU 800 MG tablet Take 800 mg by mouth every 8 (eight) hours as needed for moderate pain.    [provider]  LANTUS SOLOSTAR 100 UNIT/ML Solostar Pen 16 Units at bedtime. 07/04/22   [provider]  levETIRAcetam (KEPPRA) 750 MG tablet Take 750 mg by mouth in the morning and at bedtime.    [provider]  Lifitegrast Shirley Friar) 5 % SOLN Apply 1 drop to eye 2 (two) times daily.    [provider]  meclizine (ANTIVERT) 25 MG tablet Take 25 mg by mouth every 6 (six) hours as needed for dizziness or nausea.   10/01/19   [provider]  meloxicam (MOBIC) 15 MG tablet Take 15 mg by mouth daily. 07/13/20   [provider]  methenamine (HIPREX) 1 g tablet Take 1 g by mouth 2 (two) times daily with a meal.    [provider]  MOTEGRITY 2 MG TABS Take 1 tablet by mouth daily.    [provider]  nitroGLYCERIN (NITROSTAT) 0.4 MG SL tablet Place 0.4 mg under the tongue every 5 (five) minutes as needed for chest pain.  08/18/19   [provider]  nystatin (MYCOSTATIN/NYSTOP) powder Apply 1 Application topically daily as needed (itching and yeast).    [provider]  ondansetron (ZOFRAN-ODT) 8 MG disintegrating tablet Take 1 tablet (8 mg total) by mouth 3 (three) times daily before meals. 07/18/21   Johnson, Clanford L, MD  pantoprazole (PROTONIX) 40 MG tablet Take 40 mg by mouth 2 (two) times daily.    [provider]  prazosin (MINIPRESS) 5 MG capsule Take 2 mg by mouth at bedtime. 06/30/21   [provider]  pregabalin (LYRICA) 200 MG capsule Take 200 mg by mouth 3 (three) times daily.    [provider]  promethazine (PHENERGAN) 25 MG suppository     [provider]  promethazine (PHENERGAN) 6.25 MG/5ML syrup Take by mouth.    [provider]  propranolol (INDERAL) 40 MG tablet Take 40 mg by mouth 2 (two) times daily.  01/12/19   [provider]  QULIPTA 60 MG TABS Take 1 tablet by mouth at bedtime.    [provider]  simvastatin (ZOCOR) 20 MG tablet Take 10 mg by mouth daily at 6 PM.  12/25/18   [provider]  sucralfate (CARAFATE) 1 g tablet Take 1 g by mouth 3 (three) times daily. 07/11/21   [provider]  tadalafil (CIALIS) 5 MG tablet Take 1 tablet by mouth daily.    [provider]  topiramate (TOPAMAX) 100 MG tablet Take 100-200 mg by mouth 2 (two) times daily. Take 100 mg in the morning, Take 200 mg at bedtime 05/18/19   [provider]  TRINTELLIX 20 MG  TABS tablet Take 20 mg by mouth daily. 01/12/22   [provider]  TRUDHESA 0.725 MG/ACT AERS Place 2 sprays into the nose daily.    [provider]  UBRELVY 100 MG TABS Take 100 mg by mouth daily as needed (migraines). 07/11/21   [provider]  Vibegron (GEMTESA) 75 MG TABS Take 75 mg by mouth daily.    [provider]  VRAYLAR 3 MG capsule Take 4.5 mg by mouth daily. 02/01/22   [provider]      Allergies    Ciprofloxacin    Review of Systems   Review of Systems  Cardiovascular:  Positive for  leg swelling.  All other systems reviewed and are negative.   Physical Exam Updated Vital Signs BP 130/75 (BP Location: Left Arm)   Pulse 63   Temp 98 F (36.7 C) (Oral)   Resp 10   Ht 6' (1.829 m)   Wt 83.9 kg   SpO2 100%   BMI 25.09 kg/m  Physical Exam Vitals reviewed.  Constitutional:      Appearance: Normal appearance.  HENT:     Nose: Nose normal.     Mouth/Throat:     Mouth: Mucous membranes are moist.  Cardiovascular:     Rate and Rhythm: Normal rate.     Pulses: Normal pulses.  Pulmonary:     Effort: Pulmonary effort is normal.  Musculoskeletal:        General: Normal range of motion.  Skin:    General: Skin is warm.  Neurological:     General: No focal deficit present.     Mental Status: He is alert.  Psychiatric:        Mood and Affect: Mood normal.     ED Results / Procedures / Treatments   Labs (all labs ordered are listed, but only abnormal results are displayed) Labs Reviewed  CBC WITH DIFFERENTIAL/PLATELET - Abnormal; Notable for the following components:      Result Value   RBC 3.64 (*)    Hemoglobin 11.7 (*)    HCT 35.4 (*)    Platelets 148 (*)    Monocytes Absolute 1.1 (*)    Basophils Absolute 0.2 (*)    All other components within normal limits  COMPREHENSIVE METABOLIC PANEL - Abnormal; Notable for the following components:   CO2 21 (*)    Glucose, Bld 416 (*)    BUN 27 (*)    All other  components within normal limits    EKG None  Radiology US Venous Img Lower Bilateral (DVT)  Result Date: 02/06/2023 CLINICAL DATA:  Bilateral lower extremity pain and edema for the past 2 days, left-greater-than-right. Evaluate for DVT. EXAM: BILATERAL LOWER EXTREMITY VENOUS DOPPLER ULTRASOUND TECHNIQUE: Gray-scale sonography with graded compression, as well as color Doppler and duplex ultrasound were performed to evaluate the lower extremity deep venous systems from the level of the common femoral vein and including the common femoral, femoral, profunda femoral, popliteal and calf veins including the posterior tibial, peroneal and gastrocnemius veins when visible. The superficial great saphenous vein was also interrogated. Spectral Doppler was utilized to evaluate flow at rest and with distal augmentation maneuvers in the common femoral, femoral and popliteal veins. COMPARISON:  None Available. FINDINGS: RIGHT LOWER EXTREMITY Common Femoral Vein: No evidence of thrombus. Normal compressibility, respiratory phasicity and response to augmentation. Saphenofemoral Junction: No evidence of thrombus. Normal compressibility and flow on color Doppler imaging. Profunda Femoral Vein: No evidence of thrombus. Normal compressibility and flow on color Doppler imaging. Femoral Vein: No evidence of thrombus. Normal compressibility, respiratory phasicity and response to augmentation. Popliteal Vein: No evidence of thrombus. Normal compressibility, respiratory phasicity and response to augmentation. Calf Veins: No evidence of thrombus. Normal compressibility and flow on color Doppler imaging. Superficial Great Saphenous Vein: No evidence of thrombus. Normal compressibility. Other Findings:  None. LEFT LOWER EXTREMITY Common Femoral Vein: No evidence of thrombus. Normal compressibility, respiratory phasicity and response to augmentation. Saphenofemoral Junction: No evidence of thrombus. Normal compressibility and flow on  color Doppler imaging. Profunda Femoral Vein: No evidence of thrombus. Normal compressibility and flow on color Doppler imaging. Femoral Vein: No evidence of thrombus.  Normal compressibility, respiratory phasicity and response to augmentation. Popliteal Vein: No evidence of thrombus. Normal compressibility, respiratory phasicity and response to augmentation. Calf Veins: No evidence of thrombus. Normal compressibility and flow on color Doppler imaging. Superficial Great Saphenous Vein: No evidence of thrombus. Normal compressibility. Other Findings:  None. IMPRESSION: No evidence of DVT within either lower extremity. Electronically Signed   By: Sandi Mariscal M.D.   On: 02/06/2023 16:47    Procedures Procedures    Medications Ordered in ED Medications - No data to display  ED Course/ Medical Decision Making/ A&P                             Medical Decision Making Pt seen by Podiatrist who was told pt he could have a vein problem.    Amount and/or Complexity of Data Reviewed Labs: ordered. Decision-making details documented in ED Course.    Details: Labs ordered reviewed and interpreted.  Pt has a normal wbc count   Radiology: ordered and independent interpretation performed. Decision-making details documented in ED Course.    Details: Ultrasound bilat lower extremities  no dvt.    Risk Risk Details: Patient counseled on results of ultrasounds.  Ultrasound DVT study is normal bilateral.  Patient has an elevated glucose of 416.  Patient has not had his diabetes medicines today.  I will give the patient 10 units of regular insulin he is not in DKA.  Patient is advised to follow-up with his primary care physician for further evaluation of neuropathy.           Final Clinical Impression(s) / ED Diagnoses Final diagnoses:  Neuropathy    Rx / DC Orders ED Discharge Orders     None      An After Visit Summary was printed and given to the patient.    Fransico Meadow,  [place] 02/06/23 1803    Hayden Rasmussen, MD 02/07/23 [phone]

## 2023-02-06 NOTE — ED Provider Triage Note (Signed)
Emergency Medicine Provider Triage Evaluation Note  Dennis Yang , a 37 y.o. male  was evaluated in triage.  Pt complains of swelling to right leg.  Pt is seeing podiatrist for neuropathy in left leg.  Review of Systems  Positive: Swelling right and left leg Negative: fever  Physical Exam  BP 98/76   Pulse 62   Temp 97.6 F (36.4 C) (Oral)   Resp 19   Ht 6' (1.829 m)   Wt 83.9 kg   SpO2 100%   BMI 25.09 kg/m  Gen:   Awake, no distress   Resp:  Normal effort  MSK:  Boot left leg Other:    Medical Decision Making  Medically screening exam initiated at 12:36 PM.  Appropriate orders placed.  Dennis Yang was informed that the remainder of the evaluation will be completed by another provider, this initial triage assessment does not replace that evaluation, and the importance of remaining in the ED until their evaluation is complete.     Dennis Yang, [place] 02/06/23 1238

## 2023-02-06 NOTE — ED Notes (Signed)
Pt alert, NAD, calm, interactive. Given urinal per request.

## 2023-02-06 NOTE — ED Triage Notes (Signed)
Pt presents to ED from home C/O RLE swelling and pain.

## 2023-06-26 ENCOUNTER — Other Ambulatory Visit: Payer: Self-pay

## 2023-06-26 ENCOUNTER — Emergency Department (HOSPITAL_COMMUNITY): Payer: Medicare (Managed Care)

## 2023-06-26 ENCOUNTER — Encounter (HOSPITAL_COMMUNITY): Payer: Self-pay | Admitting: Emergency Medicine

## 2023-06-26 ENCOUNTER — Emergency Department (HOSPITAL_COMMUNITY)
Admission: EM | Admit: 2023-06-26 | Discharge: 2023-06-27 | Disposition: A | Discharge status: Home / Self Care | Payer: Medicare (Managed Care) | Attending: Emergency Medicine | Admitting: Emergency Medicine

## 2023-06-26 DIAGNOSIS — Z794 Long term (current) use of insulin: Secondary | ICD-10-CM | POA: Diagnosis not present

## 2023-06-26 DIAGNOSIS — R319 Hematuria, unspecified: Secondary | ICD-10-CM | POA: Diagnosis not present

## 2023-06-26 DIAGNOSIS — R1033 Periumbilical pain: Secondary | ICD-10-CM | POA: Insufficient documentation

## 2023-06-26 DIAGNOSIS — N201 Calculus of ureter: Secondary | ICD-10-CM

## 2023-06-26 DIAGNOSIS — R739 Hyperglycemia, unspecified: Secondary | ICD-10-CM | POA: Diagnosis not present

## 2023-06-26 LAB — URINALYSIS, ROUTINE W REFLEX MICROSCOPIC
Bilirubin Urine: NEGATIVE
Glucose, UA: >=500 mg/dL — AB
Ketones, ur: NEGATIVE mg/dL
Leukocytes,Ua: NEGATIVE
Nitrite: NEGATIVE
Protein, ur: NEGATIVE mg/dL
Specific Gravity, Urine: 1.033 — ABNORMAL HIGH (ref 1.005–1.030)
pH: 5 (ref 5.0–8.0)

## 2023-06-26 LAB — BASIC METABOLIC PANEL
Anion gap: 9 (ref 5–15)
BUN: 32 mg/dL — ABNORMAL HIGH (ref 6–20)
CO2: 19 mmol/L — ABNORMAL LOW (ref 22–32)
Calcium: 8.9 mg/dL (ref 8.9–10.3)
Chloride: 104 mmol/L (ref 98–111)
Creatinine, Ser: 1.46 mg/dL — ABNORMAL HIGH (ref 0.61–1.24)
GFR, Estimated: >60 mL/min (ref >60)
Glucose, Bld: 471 mg/dL — ABNORMAL HIGH (ref 70–99)
Potassium: 3.9 mmol/L (ref 3.5–5.1)
Sodium: 132 mmol/L — ABNORMAL LOW (ref 135–145)

## 2023-06-26 LAB — HEPATIC FUNCTION PANEL
ALT: 30 U/L (ref 0–44)
AST: 30 U/L (ref 15–41)
Albumin: 3.7 g/dL (ref 3.5–5.0)
Alkaline Phosphatase: 127 U/L — ABNORMAL HIGH (ref 38–126)
Bilirubin, Direct: <0.1 mg/dL (ref 0.0–0.2)
Total Bilirubin: 0.3 mg/dL (ref 0.3–1.2)
Total Protein: 6.6 g/dL (ref 6.5–8.1)

## 2023-06-26 LAB — CBG MONITORING, ED
Glucose-Capillary: 237 mg/dL — ABNORMAL HIGH (ref 70–99)
Glucose-Capillary: 417 mg/dL — ABNORMAL HIGH (ref 70–99)

## 2023-06-26 LAB — BLOOD GAS, VENOUS
Acid-base deficit: 0.7 mmol/L (ref 0.0–2.0)
Bicarbonate: 24.9 mmol/L (ref 20.0–28.0)
Drawn by: 62434
O2 Saturation: 70.6 %
Patient temperature: 36.4
pCO2, Ven: 43 mmHg — ABNORMAL LOW (ref 44–60)
pH, Ven: 7.37 (ref 7.25–7.43)
pO2, Ven: 39 mmHg (ref 32–45)

## 2023-06-26 LAB — CBC
HCT: 35.1 % — ABNORMAL LOW (ref 39.0–52.0)
Hemoglobin: 11.9 g/dL — ABNORMAL LOW (ref 13.0–17.0)
MCH: 32.1 pg (ref 26.0–34.0)
MCHC: 33.9 g/dL (ref 30.0–36.0)
MCV: 94.6 fL (ref 80.0–100.0)
Platelets: 389 10*3/uL (ref 150–400)
RBC: 3.71 MIL/uL — ABNORMAL LOW (ref 4.22–5.81)
RDW: 13 % (ref 11.5–15.5)
WBC: 13.4 10*3/uL — ABNORMAL HIGH (ref 4.0–10.5)
nRBC: 0 % (ref 0.0–0.2)

## 2023-06-26 LAB — LIPASE, BLOOD: Lipase: 22 U/L (ref 11–51)

## 2023-06-26 LAB — BETA-HYDROXYBUTYRIC ACID: Beta-Hydroxybutyric Acid: 0.11 mmol/L (ref 0.05–0.27)

## 2023-06-26 MED ORDER — ONDANSETRON 4 MG PO TBDP: 4.0000 mg | ORAL_TABLET | Freq: Three times a day (TID) | ORAL | 1 refills | Status: AC | PRN

## 2023-06-26 MED ORDER — SODIUM CHLORIDE 0.9 % IV SOLN: INTRAVENOUS | Status: DC

## 2023-06-26 MED ORDER — ONDANSETRON HCL 4 MG/2ML IJ SOLN
4.0000 mg | Freq: Once | INTRAMUSCULAR | Status: AC
  Administered 2023-06-26: 4 mg via INTRAVENOUS
  Filled 2023-06-26: qty 2

## 2023-06-26 MED ORDER — HEPARIN SOD (PORK) LOCK FLUSH 100 UNIT/ML IV SOLN
INTRAVENOUS | Status: AC
  Administered 2023-06-27: 500 [IU]
  Filled 2023-06-26: qty 5

## 2023-06-26 MED ORDER — SODIUM CHLORIDE 0.9 % IV BOLUS
1000.0000 mL | Freq: Once | INTRAVENOUS | Status: AC
  Administered 2023-06-26: 1000 mL via INTRAVENOUS

## 2023-06-26 MED ORDER — HYDROCODONE-ACETAMINOPHEN 5-325 MG PO TABS: 1.0000 | ORAL_TABLET | Freq: Four times a day (QID) | ORAL | 0 refills | Status: DC | PRN

## 2023-06-26 MED ORDER — IOHEXOL 300 MG/ML  SOLN
100.0000 mL | Freq: Once | INTRAMUSCULAR | Status: AC | PRN
  Administered 2023-06-26: 100 mL via INTRAVENOUS

## 2023-06-26 MED ORDER — INSULIN ASPART 100 UNIT/ML IJ SOLN: 10.0000 [IU] | Freq: Once | INTRAMUSCULAR | Status: DC

## 2023-06-26 NOTE — ED Provider Notes (Addendum)
[place] EMERGENCY DEPARTMENT AT [facility] Provider Note   CSN: [id number] Arrival date & time: 06/26/23  2109     History  Chief Complaint  Patient presents with   Flank Pain   Hyperglycemia    Dennis Yang is a 37 y.o. male.  Patient here with a complaint of blood sugar being high blood in the urine and some hematuria according to primary care office visit today.  Patient with some left lumbar back pain and some periumbilical abdominal pain.  Patient apparently was diagnosed with UTI whether that was official antibiotic prescription provided but he has not started it yet.  Patient's temperature here 97.5 oxygen sats 95% pulse 89.  Patient's allergies significant only for Cipro.  Patient's status post pancreatectomy followed at [facility] for this.  And had islet cell transplant several years ago.  Has not had great success in controlling his sugars though they last saw him in June 28.  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  Has had trouble with some abdominal pain since that time and has had trouble with sugars being poorly controlled.  According to their note sugars have been with highs in the 500s and lows in the 30s.  Patient is also followed by gastroenterology in [place].  According to their assessment he is 3 years out from [facility] AT endocrine pancreatic insufficiency poorly controlled.  Exocrine pancreatic insufficiency poorly controlled gastroparesis hiatal hernia history of gastric ulcer.       Home Medications Prior to Admission medications   Medication Sig Start Date End Date Taking? Authorizing Provider  acetaminophen (TYLENOL) 325 MG tablet Take 2 tablets (650 mg total) by mouth every 6 (six) hours as needed for mild pain, fever or headache (or Fever >/= 101). 08/13/19   Emokpae, Courage, MD  amitriptyline (ELAVIL) 10 MG tablet Take 1 tablet by mouth at bedtime. 09/19/22    [provider]  anagrelide (AGRYLIN) 1 MG capsule Take 1 mg by mouth in the morning and at bedtime. 05/27/20   [provider]  ARIPiprazole (ABILIFY) 20 MG tablet Take 20 mg by mouth at bedtime. 07/11/21   [provider]  ATROVENT HFA 17 MCG/ACT inhaler Inhale 1 puff into the lungs every 4 (four) hours as needed for wheezing.  01/16/19   [provider]  Cholecalciferol (VITAMIN D3) 1.25 MG (50000 UT) CAPS Take 1 capsule by mouth every Monday, Wednesday, and Friday. 3 times a week 12/24/18   [provider]  CREON 36000 units CPEP capsule Take 36,000-72,000 Units by mouth See admin instructions. Take 2 capsules by mouth with meals and 1 capsule with snacks 11/16/19   [provider]  cyclobenzaprine (FLEXERIL) 10 MG tablet Take 10 mg by mouth 3 (three) times daily as needed for muscle spasms. 04/19/20   [provider]  diclofenac Sodium (VOLTAREN) 1 % GEL Apply 2 g topically daily as needed (for pain).  11/23/19   [provider]  dicyclomine (BENTYL) 20 MG tablet Take 20 mg by mouth 4 (four) times daily as needed.    [provider]  Docusate Sodium (DSS) 100 MG CAPS Take 1 capsule by mouth daily.    [provider]  emtricitabine-tenofovir (TRUVADA) 200-300 MG tablet Take 1 tablet by mouth daily.    [provider]  eszopiclone (LUNESTA) 2 MG TABS tablet Take 1 tablet by mouth daily with breakfast. 07/05/22   [provider]  FEROSUL 325 (65 Fe) MG tablet Take 325 mg by mouth daily. 07/30/22   [provider]  fluticasone (FLONASE) 50 MCG/ACT nasal spray Place 1 spray into both nostrils daily.  01/20/19   [provider]  Glucagon (GVOKE HYPOPEN 2-PACK) 0.5 MG/0.1ML SOAJ Inject 0.5 mg into the skin as needed (low blood sugar).    [provider]  hydrochlorothiazide (HYDRODIURIL) 12.5 MG tablet Take 12.5 mg by mouth daily.    [provider]  hydrOXYzine  (ATARAX/VISTARIL) 25 MG tablet Take 25 mg by mouth 4 (four) times daily. 07/11/21   [provider]  IBU 800 MG tablet Take 800 mg by mouth every 8 (eight) hours as needed for moderate pain.    [provider]  LANTUS SOLOSTAR 100 UNIT/ML Solostar Pen 16 Units at bedtime. 07/04/22   [provider]  levETIRAcetam (KEPPRA) 750 MG tablet Take 750 mg by mouth in the morning and at bedtime.    [provider]  Lifitegrast Benay Spice) 5 % SOLN Apply 1 drop to eye 2 (two) times daily.    [provider]  meclizine (ANTIVERT) 25 MG tablet Take 25 mg by mouth every 6 (six) hours as needed for dizziness or nausea.  10/01/19   [provider]  meloxicam (MOBIC) 15 MG tablet Take 15 mg by mouth daily. 07/13/20   [provider]  methenamine (HIPREX) 1 g tablet Take 1 g by mouth 2 (two) times daily with a meal.    [provider]  MOTEGRITY 2 MG TABS Take 1 tablet by mouth daily.    [provider]  nitroGLYCERIN (NITROSTAT) 0.4 MG SL tablet Place 0.4 mg under the tongue every 5 (five) minutes as needed for chest pain.  08/18/19   [provider]  nystatin (MYCOSTATIN/NYSTOP) powder Apply 1 Application topically daily as needed (itching and yeast).    [provider]  ondansetron (ZOFRAN-ODT) 8 MG disintegrating tablet Take 1 tablet (8 mg total) by mouth 3 (three) times daily before meals. 07/18/21   Johnson, Clanford L, MD  pantoprazole (PROTONIX) 40 MG tablet Take 40 mg by mouth 2 (two) times daily.    [provider]  prazosin (MINIPRESS) 5 MG capsule Take 2 mg by mouth at bedtime. 06/30/21   [provider]  pregabalin (LYRICA) 200 MG capsule Take 200 mg by mouth 3 (three) times daily.    [provider]  promethazine (PHENERGAN) 25 MG suppository     [provider]  promethazine (PHENERGAN) 6.25 MG/5ML syrup Take by mouth.    [provider]  propranolol (INDERAL) 40 MG tablet  Take 40 mg by mouth 2 (two) times daily.  01/12/19   [provider]  QULIPTA 60 MG TABS Take 1 tablet by mouth at bedtime.    [provider]  simvastatin (ZOCOR) 20 MG tablet Take 10 mg by mouth daily at 6 PM.  12/25/18   [provider]  sucralfate (CARAFATE) 1 g tablet Take 1 g by mouth 3 (three) times daily. 07/11/21   [provider]  tadalafil (CIALIS) 5 MG tablet Take 1 tablet by mouth daily.    [provider]  topiramate (TOPAMAX) 100 MG tablet Take 100-200 mg by mouth 2 (two) times daily. Take 100 mg in the morning, Take 200 mg at bedtime 05/18/19   [provider]  TRINTELLIX 20 MG TABS tablet Take 20 mg by mouth daily. 01/12/22   [provider]  TRUDHESA 0.725 MG/ACT AERS  Place 2 sprays into the nose daily.    [provider]  UBRELVY 100 MG TABS Take 100 mg by mouth daily as needed (migraines). 07/11/21   [provider]  Vibegron (GEMTESA) 75 MG TABS Take 75 mg by mouth daily.    [provider]  VRAYLAR 3 MG capsule Take 4.5 mg by mouth daily. 02/01/22   [provider]      Allergies    Ciprofloxacin    Review of Systems   Review of Systems  Constitutional:  Negative for chills and fever.  HENT:  Negative for ear pain and sore throat.   Eyes:  Negative for pain and visual disturbance.  Respiratory:  Negative for cough and shortness of breath.   Cardiovascular:  Negative for chest pain and palpitations.  Gastrointestinal:  Positive for abdominal pain and nausea. Negative for vomiting.  Genitourinary:  Positive for flank pain and hematuria. Negative for dysuria.  Musculoskeletal:  Positive for back pain. Negative for arthralgias.  Skin:  Negative for color change and rash.  Neurological:  Negative for seizures and syncope.  All other systems reviewed and are negative.   Physical Exam Updated Vital Signs BP 93/78   Pulse 89   Temp (!) 97.5 F (36.4 C) (Oral)   Resp 20   Wt 83.9  kg   SpO2 95%   BMI 25.09 kg/m  Physical Exam Vitals and nursing note reviewed.  Constitutional:      General: He is not in acute distress.    Appearance: Normal appearance. He is well-developed.  HENT:     Head: Normocephalic and atraumatic.  Eyes:     Extraocular Movements: Extraocular movements intact.     Conjunctiva/sclera: Conjunctivae normal.     Pupils: Pupils are equal, round, and reactive to light.  Cardiovascular:     Rate and Rhythm: Normal rate and regular rhythm.     Heart sounds: No murmur heard. Pulmonary:     Effort: Pulmonary effort is normal. No respiratory distress.     Breath sounds: Normal breath sounds.  Abdominal:     Palpations: Abdomen is soft.     Tenderness: There is no abdominal tenderness.  Musculoskeletal:        General: No swelling.     Cervical back: Normal range of motion and neck supple.  Skin:    General: Skin is warm and dry.     Capillary Refill: Capillary refill takes less than 2 seconds.  Neurological:     General: No focal deficit present.     Mental Status: He is alert and oriented to person, place, and time.  Psychiatric:        Mood and Affect: Mood normal.     ED Results / Procedures / Treatments   Labs (all labs ordered are listed, but only abnormal results are displayed) Labs Reviewed  BASIC METABOLIC PANEL - Abnormal; Notable for the following components:      Result Value   Sodium 132 (*)    CO2 19 (*)    Glucose, Bld 471 (*)    BUN 32 (*)    Creatinine, Ser 1.46 (*)    All other components within normal limits  CBC - Abnormal; Notable for the following components:   WBC 13.4 (*)    RBC 3.71 (*)    Hemoglobin 11.9 (*)    HCT 35.1 (*)    All other components within normal limits  URINALYSIS, ROUTINE W REFLEX MICROSCOPIC - Abnormal; Notable for the  following components:   Specific Gravity, Urine 1.033 (*)    Glucose, UA >=500 (*)    Hgb urine dipstick SMALL (*)    Bacteria, UA RARE (*)    All other components  within normal limits  CBG MONITORING, ED - Abnormal; Notable for the following components:   Glucose-Capillary 417 (*)    All other components within normal limits  BLOOD GAS, VENOUS  BETA-HYDROXYBUTYRIC ACID  LIPASE, BLOOD  HEPATIC FUNCTION PANEL    EKG None  Radiology No results found.  Procedures Procedures    Medications Ordered in ED Medications  0.9 %  sodium chloride infusion (has no administration in time range)  sodium chloride 0.9 % bolus 1,000 mL (has no administration in time range)  ondansetron (ZOFRAN) injection 4 mg (has no administration in time range)    ED Course/ Medical Decision Making/ A&P                             Medical Decision Making Amount and/or Complexity of Data Reviewed Labs: ordered. Radiology: ordered.  Risk Prescription drug management.   Patient with fairly complicated past medical history status post pancreatectomy with islet cell transplant in 2021.  Blood sugar here 471 CO2 19 sodium 132 due to the elevated sugar potassium good at 3.9.  Creatinine 1.46 GFR is greater than 60 with that.  White blood cell 13.4 hemoglobin 11.9 platelets 389.  Based on this have ordered lipase hepatic panel have ordered venous blood gas IV fluids and beta hydroxybutyrate.  Will also get CT scan abdomen pelvis with contrast.  Not sure if this is really kidney stone related pain but that we will sort that out for [place] and we will also get urinalysis which is still pending.  Additional labs of interest urinalysis does not seem to be consistent with urinary tract infection rare bacteria white blood cells 0-5 RBC 6-10.  Will send for urine culture.  Venous blood gas not acidotic pH 7.37 which is very good.  Beta-hydroxybutyrate was 0.11 that is reassuring lipase 22 and liver function test significant for an alk phos 127 otherwise negative.  Based on this we will give patient a little bit of insulin IV and we are waiting on the CT abdomen.  Does not seem to be  consistent with urinary tract infection.  In addition does not seem to be consistent with DKA although it is hyperglycemia.  If CT abdomen does not have anything acute patient can probably be discharged if sugar comes down a little bit better.  CRITICAL CARE Performed by: Vanetta Mulders Total critical care time: 35 minutes Critical care time was exclusive of separately billable procedures and treating other patients. Critical care was necessary to treat or prevent imminent or life-threatening deterioration. Critical care was time spent personally by me on the following activities: development of treatment plan with patient and/or surrogate as well as nursing, discussions with consultants, evaluation of patient's response to treatment, examination of patient, obtaining history from patient or surrogate, ordering and performing treatments and interventions, ordering and review of laboratory studies, ordering and review of radiographic studies, pulse oximetry and re-evaluation of patient's condition.  CT scan of the abdomen shows evidence of a distal left ureteral stone that is pretty good size 5 x 7 mm.  Will have patient follow-up with urology information provided we will give him a prepack of hydrocodone and a prepack of Zofran here tonight.  This would explain a  little bit of the hematuria as mentioned no evidence of urinary tract infection.  Patient's blood sugar after the fluids is down to 237.  So he will not receive the IV insulin.  Final Clinical Impression(s) / ED Diagnoses Final diagnoses:  Hyperglycemia  Periumbilical abdominal pain    Rx / DC Orders ED Discharge Orders     None         Vanetta Mulders, MD 06/26/23 2251    Vanetta Mulders, MD 06/26/23 7322    Vanetta Mulders, MD 06/26/23 0254    Vanetta Mulders, MD 06/26/23 2706    Vanetta Mulders, MD 06/26/23 2349    Vanetta Mulders, MD 06/26/23 2356

## 2023-06-26 NOTE — ED Triage Notes (Signed)
Pt in POV with hyperglycemia (CBG >500 at home, hx DM1), and L flank pain. Went to PCP this am and was dx with a UTI. Has not started abx for this yet. Temp 97.5

## 2023-06-26 NOTE — ED Notes (Signed)
Patient transported to CT 

## 2023-06-26 NOTE — Discharge Instructions (Addendum)
Left ureteral stone pretty good size contact urology here locally tomorrow for follow-up appointment.  Take the pain medication as directed prepack of hydrocodone provided.  Take the Zofran as directed prescription provided.  Return for any new or worse symptoms.

## 2023-06-27 DIAGNOSIS — R739 Hyperglycemia, unspecified: Secondary | ICD-10-CM | POA: Diagnosis not present

## 2023-06-28 LAB — URINE CULTURE: Culture: <10000 — AB

## 2023-07-02 MED FILL — Hydrocodone-Acetaminophen Tab 5-325 MG: ORAL | Qty: 6 | Status: AC

## 2023-07-10 ENCOUNTER — Encounter (HOSPITAL_COMMUNITY): Payer: Self-pay

## 2023-07-10 ENCOUNTER — Observation Stay (HOSPITAL_COMMUNITY)
Admission: EM | Admit: 2023-07-10 | Discharge: 2023-07-11 | Disposition: A | Discharge status: Home / Self Care | Payer: Medicare (Managed Care) | Attending: Family Medicine | Admitting: Family Medicine

## 2023-07-10 ENCOUNTER — Other Ambulatory Visit: Payer: Self-pay

## 2023-07-10 ENCOUNTER — Emergency Department (HOSPITAL_COMMUNITY): Payer: Medicare (Managed Care)

## 2023-07-10 DIAGNOSIS — E1165 Type 2 diabetes mellitus with hyperglycemia: Secondary | ICD-10-CM | POA: Insufficient documentation

## 2023-07-10 DIAGNOSIS — K219 Gastro-esophageal reflux disease without esophagitis: Secondary | ICD-10-CM | POA: Diagnosis present

## 2023-07-10 DIAGNOSIS — Z79899 Other long term (current) drug therapy: Secondary | ICD-10-CM | POA: Diagnosis not present

## 2023-07-10 DIAGNOSIS — N132 Hydronephrosis with renal and ureteral calculous obstruction: Secondary | ICD-10-CM | POA: Diagnosis not present

## 2023-07-10 DIAGNOSIS — R569 Unspecified convulsions: Secondary | ICD-10-CM | POA: Diagnosis not present

## 2023-07-10 DIAGNOSIS — Z87891 Personal history of nicotine dependence: Secondary | ICD-10-CM | POA: Diagnosis not present

## 2023-07-10 DIAGNOSIS — Z794 Long term (current) use of insulin: Secondary | ICD-10-CM | POA: Diagnosis not present

## 2023-07-10 DIAGNOSIS — G40909 Epilepsy, unspecified, not intractable, without status epilepticus: Secondary | ICD-10-CM | POA: Insufficient documentation

## 2023-07-10 DIAGNOSIS — Z8673 Personal history of transient ischemic attack (TIA), and cerebral infarction without residual deficits: Secondary | ICD-10-CM | POA: Insufficient documentation

## 2023-07-10 DIAGNOSIS — R1030 Lower abdominal pain, unspecified: Secondary | ICD-10-CM | POA: Diagnosis present

## 2023-07-10 DIAGNOSIS — F319 Bipolar disorder, unspecified: Secondary | ICD-10-CM | POA: Diagnosis not present

## 2023-07-10 DIAGNOSIS — E1101 Type 2 diabetes mellitus with hyperosmolarity with coma: Secondary | ICD-10-CM | POA: Diagnosis not present

## 2023-07-10 DIAGNOSIS — E119 Type 2 diabetes mellitus without complications: Secondary | ICD-10-CM

## 2023-07-10 DIAGNOSIS — I480 Paroxysmal atrial fibrillation: Secondary | ICD-10-CM | POA: Diagnosis not present

## 2023-07-10 DIAGNOSIS — E1100 Type 2 diabetes mellitus with hyperosmolarity without nonketotic hyperglycemic-hyperosmolar coma (NKHHC): Principal | ICD-10-CM | POA: Insufficient documentation

## 2023-07-10 DIAGNOSIS — Z90410 Acquired total absence of pancreas: Secondary | ICD-10-CM

## 2023-07-10 DIAGNOSIS — Z8669 Personal history of other diseases of the nervous system and sense organs: Secondary | ICD-10-CM

## 2023-07-10 LAB — CBC WITH DIFFERENTIAL/PLATELET
Abs Immature Granulocytes: 0.05 10*3/uL (ref 0.00–0.07)
Basophils Absolute: 0.1 10*3/uL (ref 0.0–0.1)
Basophils Relative: 1 %
Eosinophils Absolute: 0.2 10*3/uL (ref 0.0–0.5)
Eosinophils Relative: 1 %
HCT: 32.6 % — ABNORMAL LOW (ref 39.0–52.0)
Hemoglobin: 10.9 g/dL — ABNORMAL LOW (ref 13.0–17.0)
Immature Granulocytes: 0 %
Lymphocytes Relative: 14 %
Lymphs Abs: 1.9 10*3/uL (ref 0.7–4.0)
MCH: 31.9 pg (ref 26.0–34.0)
MCHC: 33.4 g/dL (ref 30.0–36.0)
MCV: 95.3 fL (ref 80.0–100.0)
Monocytes Absolute: 1.1 10*3/uL — ABNORMAL HIGH (ref 0.1–1.0)
Monocytes Relative: 8 %
Neutro Abs: 10 10*3/uL — ABNORMAL HIGH (ref 1.7–7.7)
Neutrophils Relative %: 76 %
Platelets: 360 10*3/uL (ref 150–400)
RBC: 3.42 MIL/uL — ABNORMAL LOW (ref 4.22–5.81)
RDW: 13 % (ref 11.5–15.5)
WBC: 13.3 10*3/uL — ABNORMAL HIGH (ref 4.0–10.5)
nRBC: 0 % (ref 0.0–0.2)

## 2023-07-10 LAB — MRSA NEXT GEN BY PCR, NASAL: MRSA by PCR Next Gen: NOT DETECTED

## 2023-07-10 LAB — COMPREHENSIVE METABOLIC PANEL
ALT: 28 U/L (ref 0–44)
AST: 31 U/L (ref 15–41)
Albumin: 3.5 g/dL (ref 3.5–5.0)
Alkaline Phosphatase: 124 U/L (ref 38–126)
Anion gap: 7 (ref 5–15)
BUN: 26 mg/dL — ABNORMAL HIGH (ref 6–20)
CO2: 20 mmol/L — ABNORMAL LOW (ref 22–32)
Calcium: 8 mg/dL — ABNORMAL LOW (ref 8.9–10.3)
Chloride: 99 mmol/L (ref 98–111)
Creatinine, Ser: 1.27 mg/dL — ABNORMAL HIGH (ref 0.61–1.24)
GFR, Estimated: >60 mL/min (ref >60)
Glucose, Bld: 871 mg/dL (ref 70–99)
Potassium: 4.3 mmol/L (ref 3.5–5.1)
Sodium: 126 mmol/L — ABNORMAL LOW (ref 135–145)
Total Bilirubin: 0.2 mg/dL — ABNORMAL LOW (ref 0.3–1.2)
Total Protein: 6.1 g/dL — ABNORMAL LOW (ref 6.5–8.1)

## 2023-07-10 LAB — BASIC METABOLIC PANEL
Anion gap: 6 (ref 5–15)
BUN: 18 mg/dL (ref 6–20)
CO2: 24 mmol/L (ref 22–32)
Calcium: 8.5 mg/dL — ABNORMAL LOW (ref 8.9–10.3)
Chloride: 109 mmol/L (ref 98–111)
Creatinine, Ser: 0.97 mg/dL (ref 0.61–1.24)
GFR, Estimated: >60 mL/min (ref >60)
Glucose, Bld: 104 mg/dL — ABNORMAL HIGH (ref 70–99)
Potassium: 3.5 mmol/L (ref 3.5–5.1)
Sodium: 139 mmol/L (ref 135–145)

## 2023-07-10 LAB — CBG MONITORING, ED
Glucose-Capillary: >600 mg/dL (ref 70–99)
Glucose-Capillary: 530 mg/dL (ref 70–99)
Glucose-Capillary: 380 mg/dL — ABNORMAL HIGH (ref 70–99)
Glucose-Capillary: 299 mg/dL — ABNORMAL HIGH (ref 70–99)
Glucose-Capillary: 300 mg/dL — ABNORMAL HIGH (ref 70–99)

## 2023-07-10 LAB — URINALYSIS, ROUTINE W REFLEX MICROSCOPIC
Bacteria, UA: NONE SEEN
Bilirubin Urine: NEGATIVE
Glucose, UA: >=500 mg/dL — AB
Ketones, ur: NEGATIVE mg/dL
Leukocytes,Ua: NEGATIVE
Nitrite: NEGATIVE
Protein, ur: NEGATIVE mg/dL
Specific Gravity, Urine: 1.024 (ref 1.005–1.030)
pH: 6 (ref 5.0–8.0)

## 2023-07-10 LAB — GLUCOSE, CAPILLARY
Glucose-Capillary: 97 mg/dL (ref 70–99)
Glucose-Capillary: 55 mg/dL — ABNORMAL LOW (ref 70–99)
Glucose-Capillary: 90 mg/dL (ref 70–99)
Glucose-Capillary: 90 mg/dL (ref 70–99)
Glucose-Capillary: 187 mg/dL — ABNORMAL HIGH (ref 70–99)

## 2023-07-10 LAB — BETA-HYDROXYBUTYRIC ACID: Beta-Hydroxybutyric Acid: 0.3 mmol/L — ABNORMAL HIGH (ref 0.05–0.27)

## 2023-07-10 MED ORDER — MIRABEGRON ER 25 MG PO TB24
25.0000 mg | ORAL_TABLET | Freq: Every day | ORAL | Status: DC
  Administered 2023-07-11: 25 mg via ORAL
  Filled 2023-07-10: qty 1

## 2023-07-10 MED ORDER — INSULIN GLARGINE-YFGN 100 UNIT/ML ~~LOC~~ SOLN
16.0000 [IU] | Freq: Every day | SUBCUTANEOUS | Status: DC
  Filled 2023-07-10 (×2): qty 0.16

## 2023-07-10 MED ORDER — INSULIN REGULAR(HUMAN) IN NACL 100-0.9 UT/100ML-% IV SOLN
INTRAVENOUS | Status: DC
  Administered 2023-07-10: 17 [IU]/h via INTRAVENOUS
  Filled 2023-07-10: qty 100

## 2023-07-10 MED ORDER — INSULIN ASPART 100 UNIT/ML IJ SOLN
10.0000 [IU] | Freq: Once | INTRAMUSCULAR | Status: AC
  Administered 2023-07-10: 10 [IU] via SUBCUTANEOUS
  Filled 2023-07-10: qty 1

## 2023-07-10 MED ORDER — POTASSIUM CHLORIDE 10 MEQ/100ML IV SOLN
10.0000 meq | INTRAVENOUS | Status: AC
  Administered 2023-07-10 (×2): 10 meq via INTRAVENOUS
  Filled 2023-07-10 (×2): qty 100

## 2023-07-10 MED ORDER — LACTATED RINGERS IV SOLN: INTRAVENOUS | Status: DC

## 2023-07-10 MED ORDER — CARIPRAZINE HCL 1.5 MG PO CAPS
4.5000 mg | ORAL_CAPSULE | Freq: Every day | ORAL | Status: DC
  Administered 2023-07-11: 4.5 mg via ORAL
  Filled 2023-07-10 (×2): qty 3

## 2023-07-10 MED ORDER — LACTATED RINGERS IV BOLUS
500.0000 mL | Freq: Once | INTRAVENOUS | Status: AC
  Administered 2023-07-10: 500 mL via INTRAVENOUS

## 2023-07-10 MED ORDER — TAMSULOSIN HCL 0.4 MG PO CAPS
0.4000 mg | ORAL_CAPSULE | Freq: Every day | ORAL | Status: DC
  Administered 2023-07-10 – 2023-07-11 (×2): 0.4 mg via ORAL
  Filled 2023-07-10 (×2): qty 1

## 2023-07-10 MED ORDER — LEVETIRACETAM 500 MG PO TABS
750.0000 mg | ORAL_TABLET | Freq: Two times a day (BID) | ORAL | Status: DC
  Administered 2023-07-10 – 2023-07-11 (×2): 750 mg via ORAL
  Filled 2023-07-10 (×2): qty 1

## 2023-07-10 MED ORDER — INSULIN REGULAR(HUMAN) IN NACL 100-0.9 UT/100ML-% IV SOLN: INTRAVENOUS | Status: DC

## 2023-07-10 MED ORDER — VORTIOXETINE HBR 5 MG PO TABS
10.0000 mg | ORAL_TABLET | Freq: Every day | ORAL | Status: DC
  Administered 2023-07-11: 10 mg via ORAL
  Filled 2023-07-10 (×2): qty 1

## 2023-07-10 MED ORDER — DICYCLOMINE HCL 20 MG PO TABS: 20.0000 mg | ORAL_TABLET | Freq: Four times a day (QID) | ORAL | Status: DC | PRN

## 2023-07-10 MED ORDER — TOPIRAMATE 100 MG PO TABS
200.0000 mg | ORAL_TABLET | Freq: Every day | ORAL | Status: DC
  Administered 2023-07-10: 200 mg via ORAL
  Filled 2023-07-10: qty 2

## 2023-07-10 MED ORDER — DOCUSATE SODIUM 100 MG PO CAPS
100.0000 mg | ORAL_CAPSULE | Freq: Every day | ORAL | Status: DC
  Administered 2023-07-11: 100 mg via ORAL
  Filled 2023-07-10: qty 1

## 2023-07-10 MED ORDER — SIMVASTATIN 20 MG PO TABS: 10.0000 mg | ORAL_TABLET | Freq: Every day | ORAL | Status: DC

## 2023-07-10 MED ORDER — LACTATED RINGERS IV SOLN
INTRAVENOUS | Status: DC
  Administered 2023-07-11: 125 mL/h via INTRAVENOUS

## 2023-07-10 MED ORDER — TOPIRAMATE 100 MG PO TABS
100.0000 mg | ORAL_TABLET | Freq: Every day | ORAL | Status: DC
  Administered 2023-07-11: 100 mg via ORAL
  Filled 2023-07-10: qty 1

## 2023-07-10 MED ORDER — ENOXAPARIN SODIUM 40 MG/0.4ML IJ SOSY
40.0000 mg | PREFILLED_SYRINGE | INTRAMUSCULAR | Status: DC
  Administered 2023-07-10: 40 mg via SUBCUTANEOUS
  Filled 2023-07-10: qty 0.4

## 2023-07-10 MED ORDER — ONDANSETRON HCL 4 MG/2ML IJ SOLN
4.0000 mg | Freq: Once | INTRAMUSCULAR | Status: AC
  Administered 2023-07-10: 4 mg via INTRAVENOUS
  Filled 2023-07-10: qty 2

## 2023-07-10 MED ORDER — PANCRELIPASE (LIP-PROT-AMYL) 36000-114000 UNITS PO CPEP: 36000.0000 [IU] | ORAL_CAPSULE | ORAL | Status: DC | PRN

## 2023-07-10 MED ORDER — MORPHINE SULFATE (PF) 2 MG/ML IV SOLN
1.0000 mg | INTRAVENOUS | Status: DC | PRN
  Administered 2023-07-10: 2 mg via INTRAVENOUS
  Administered 2023-07-11: 1 mg via INTRAVENOUS
  Administered 2023-07-11: 2 mg via INTRAVENOUS
  Filled 2023-07-10 (×3): qty 1

## 2023-07-10 MED ORDER — DEXTROSE IN LACTATED RINGERS 5 % IV SOLN: INTRAVENOUS | Status: DC

## 2023-07-10 MED ORDER — AMITRIPTYLINE HCL 25 MG PO TABS
25.0000 mg | ORAL_TABLET | Freq: Every day | ORAL | Status: DC
  Filled 2023-07-10: qty 1

## 2023-07-10 MED ORDER — AMITRIPTYLINE HCL 25 MG PO TABS
25.0000 mg | ORAL_TABLET | Freq: Every day | ORAL | Status: DC
  Administered 2023-07-10: 25 mg via ORAL

## 2023-07-10 MED ORDER — ATOGEPANT 60 MG PO TABS: 1.0000 | ORAL_TABLET | Freq: Every day | ORAL | Status: DC

## 2023-07-10 MED ORDER — EMTRICITABINE-TENOFOVIR AF 200-25 MG PO TABS
1.0000 | ORAL_TABLET | Freq: Every day | ORAL | Status: DC
  Administered 2023-07-11: 1 via ORAL
  Filled 2023-07-10 (×2): qty 1

## 2023-07-10 MED ORDER — PANTOPRAZOLE SODIUM 40 MG PO TBEC
40.0000 mg | DELAYED_RELEASE_TABLET | Freq: Two times a day (BID) | ORAL | Status: DC
  Administered 2023-07-10 – 2023-07-11 (×2): 40 mg via ORAL
  Filled 2023-07-10 (×2): qty 1

## 2023-07-10 MED ORDER — DEXTROSE 50 % IV SOLN
0.0000 mL | INTRAVENOUS | Status: DC | PRN
  Administered 2023-07-10: 50 mL via INTRAVENOUS
  Filled 2023-07-10: qty 50

## 2023-07-10 MED ORDER — INSULIN ASPART 100 UNIT/ML IJ SOLN
0.0000 [IU] | Freq: Three times a day (TID) | INTRAMUSCULAR | Status: DC
  Administered 2023-07-11: 15 [IU] via SUBCUTANEOUS
  Administered 2023-07-11: 8 [IU] via SUBCUTANEOUS

## 2023-07-10 MED ORDER — PREGABALIN 75 MG PO CAPS
200.0000 mg | ORAL_CAPSULE | Freq: Three times a day (TID) | ORAL | Status: DC
  Administered 2023-07-10 – 2023-07-11 (×3): 200 mg via ORAL
  Filled 2023-07-10 (×3): qty 2

## 2023-07-10 MED ORDER — POTASSIUM CHLORIDE CRYS ER 20 MEQ PO TBCR
40.0000 meq | EXTENDED_RELEASE_TABLET | Freq: Four times a day (QID) | ORAL | Status: AC
  Administered 2023-07-10 – 2023-07-11 (×2): 40 meq via ORAL
  Filled 2023-07-10 (×2): qty 2

## 2023-07-10 MED ORDER — SODIUM CHLORIDE 0.9 % IV BOLUS
1000.0000 mL | Freq: Once | INTRAVENOUS | Status: AC
  Administered 2023-07-10: 1000 mL via INTRAVENOUS

## 2023-07-10 MED ORDER — OXYCODONE-ACETAMINOPHEN 5-325 MG PO TABS
1.0000 | ORAL_TABLET | Freq: Three times a day (TID) | ORAL | Status: DC | PRN
  Administered 2023-07-10: 1 via ORAL
  Filled 2023-07-10: qty 1

## 2023-07-10 MED ORDER — DEXTROSE 50 % IV SOLN: 0.0000 mL | INTRAVENOUS | Status: DC | PRN

## 2023-07-10 MED ORDER — MORPHINE SULFATE (PF) 4 MG/ML IV SOLN
4.0000 mg | Freq: Once | INTRAVENOUS | Status: AC
  Administered 2023-07-10: 4 mg via INTRAVENOUS
  Filled 2023-07-10: qty 1

## 2023-07-10 MED ORDER — LACTATED RINGERS IV BOLUS
20.0000 mL/kg | Freq: Once | INTRAVENOUS | Status: AC
  Administered 2023-07-10: 1724 mL via INTRAVENOUS

## 2023-07-10 MED ORDER — PANCRELIPASE (LIP-PROT-AMYL) 36000-114000 UNITS PO CPEP
72000.0000 [IU] | ORAL_CAPSULE | Freq: Three times a day (TID) | ORAL | Status: DC
  Administered 2023-07-11 (×3): 72000 [IU] via ORAL
  Filled 2023-07-10 (×3): qty 2

## 2023-07-10 NOTE — H&P (Addendum)
TRH H&P   Patient Demographics:    Demiko Cadiente, is a 37 y.o. male  MRN: [id number]   DOB - [birth year]  Admit Date - 07/10/2023  Outpatient Primary MD for the patient is Tacy Learn, FNP  Referring MD/NP/PA: PA Idol    Patient coming from: home  Chief Complaint  Patient presents with   Abdominal Pain      HPI:    Johnthan Melgarejo  is a 37 y.o. 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, chronic back pain, seizure disorder, prior alcohol abuse, neuropathy,  PTSD , anxiety, depression, fibromyalgia overactive bladder, IBS ,Hx PAF ,Hx Hep C s/p SVR (Mavyret x 8wks in 2018) gerd, esophageal stricture, IBS, gastroparesis no tobacco,  drugs,  patient presents to ED secondary to complaints of abdominal pain, reports persistent left flank pain, and lower abdominal pain, he was recently diagnosed with 5 x 7 x 8 mm left distal ureteral kidney stones at [facility], ED on 06/26/2023, he underwent lithotripsy by his urologist at [facility], patient continues to pass small pieces of the stone, he did develop generalized weakness, fatigue and worsening pain, he does report subjective fever, poor appetite, reports he has been compliant with his insulin though, had follow-up with his urologist yesterday, and he asked to come to ED given his blood sugar is very high. -In ED patient was noted to have a glucose of 871, bicarb of, but pH and anion gap within normal limit, CT renal protocol has been obtained, showing stable hydronephrosis, with distal stone 6 mm at UVJ, ED physician discussed with on-call urologist who reported patient may stay at any pain, no intervention currently bedside pain control, hydration and Flomax and stone should pass, [facility] consulted to admit for concern of HHNC.     Review of systems:      A full 10 point Review of Systems was done, except as stated above, all other Review of Systems were negative.   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 Verdie Drown -[facility] Transplant Team 1. 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 Antrectomy 4. Splenectomy 5. Gastrojejunostomy (retrocolic) 6. Roux Y hepaticojejunostomy (Retrocolic) 7. Placement of nasoenteric feeding tube 8. Lysis of extensive  peripancreatic adhesions.   Schizoaffective disorder (HCC)    TIA (transient ischemic attack)       Past Surgical History:  Procedure Laterality Date   botox injections in bladder     CHOLECYSTECTOMY     ELBOW FRACTURE SURGERY     FOOT SURGERY     HERNIA REPAIR     LITHOTRIPSY     NASAL SINUS SURGERY     PANCREATECTOMY  04/2020   05/03/20 Dr. Verdie Drown- [facility] Transplant Surgery 1. 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 Antrectomy 4. Splenectomy 5. Gastrojejunostomy (retrocolic) 6. Roux Y hepaticojejunostomy (Retrocolic) 7. Placement of nasoenteric feeding tube 8. Lysis of extensive peripancreatic adhesions (This was treatment for chronic pancreatitis/ chronic pain).      Social History:     Social History   Tobacco Use   Smoking status: Former   Smokeless tobacco: Never  Substance Use Topics   Alcohol use: Not Currently      Family History :     Family History  Problem Relation Age of Onset   Kidney  disease Mother    Depression Mother    Hypertension Mother    Hypertension Father       Home Medications:   Prior to Admission medications   Medication Sig Start Date End Date Taking? Authorizing Provider  acetaminophen (TYLENOL) 325 MG tablet Take 2 tablets (650 mg total) by mouth every 6 (six) hours as needed for mild pain, fever or headache (or Fever >/= 101). 08/13/19   Emokpae, Courage, MD  amitriptyline (ELAVIL) 10 MG tablet Take 1 tablet by mouth at bedtime. 09/19/22   [provider]  anagrelide (AGRYLIN) 1 MG capsule Take 1 mg by mouth in the morning and at bedtime. 05/27/20   [provider]  ARIPiprazole (ABILIFY) 20 MG tablet Take 20 mg by mouth at bedtime. 07/11/21   [provider]  ATROVENT HFA 17 MCG/ACT inhaler Inhale 1 puff into the lungs every 4 (four) hours as needed for wheezing.  01/16/19   [provider]  Cholecalciferol (VITAMIN D3) 1.25 MG (50000 UT) CAPS Take 1 capsule by mouth every Monday, Wednesday, and Friday. 3 times a week 12/24/18   [provider]  CREON 36000 units CPEP capsule Take 36,000-72,000 Units by mouth See admin instructions. Take 2 capsules by mouth with meals and 1 capsule with snacks 11/16/19   [provider]  cyclobenzaprine (FLEXERIL) 10 MG tablet Take 10 mg by mouth 3 (three) times daily as needed for muscle spasms. 04/19/20   [provider]  diclofenac Sodium (VOLTAREN) 1 % GEL Apply 2 g topically daily as needed (for pain).  11/23/19   [provider]  dicyclomine (BENTYL) 20 MG tablet Take 20 mg by mouth 4 (four) times daily as needed.    [provider]  Docusate Sodium (DSS) 100 MG CAPS Take 1 capsule by mouth daily.    [provider]  emtricitabine-tenofovir (TRUVADA) 200-300 MG tablet Take 1 tablet by mouth daily.    [provider]  eszopiclone (LUNESTA) 2 MG TABS tablet Take 1 tablet by mouth daily with breakfast. 07/05/22   [provider]  FEROSUL 325 (65 Fe) MG tablet Take 325 mg by mouth daily. 07/30/22   [provider]  fluticasone (FLONASE) 50 MCG/ACT nasal spray Place 1 spray into both nostrils daily.  01/20/19   [provider]  Glucagon (GVOKE HYPOPEN 2-PACK) 0.5 MG/0.1ML SOAJ Inject 0.5 mg into the skin as needed (  low blood sugar).    [provider]  hydrochlorothiazide (HYDRODIURIL) 12.5 MG tablet Take 12.5 mg by mouth daily.    [provider]  HYDROcodone-acetaminophen (NORCO/VICODIN) 5-325 MG tablet Take 1 tablet by mouth every 6 (six) hours as needed. 06/26/23   Vanetta Mulders, MD  hydrOXYzine (ATARAX/VISTARIL) 25 MG tablet Take 25 mg by mouth 4 (four) times daily. 07/11/21   [provider]  IBU 800 MG tablet Take 800 mg by mouth every 8 (eight) hours as needed for moderate pain.    [provider]  LANTUS SOLOSTAR 100 UNIT/ML Solostar Pen 16 Units at bedtime. 07/04/22   [provider]  levETIRAcetam (KEPPRA) 750 MG tablet Take 750 mg by mouth in the morning and at bedtime.    [provider]  Lifitegrast Benay Spice) 5 % SOLN Apply 1 drop to eye 2 (two) times daily.    [provider]  meclizine (ANTIVERT) 25 MG tablet Take 25 mg by mouth every 6 (six) hours as needed for dizziness or nausea.  10/01/19   [provider]  meloxicam (MOBIC) 15 MG tablet Take 15 mg by mouth daily. 07/13/20   [provider]  methenamine (HIPREX) 1 g tablet Take 1 g by mouth 2 (two) times daily with a meal.    [provider]  MOTEGRITY 2 MG TABS Take 1 tablet by mouth daily.    [provider]  nitroGLYCERIN (NITROSTAT) 0.4 MG SL tablet Place 0.4 mg under the tongue every 5 (five) minutes as needed for chest pain.  08/18/19   [provider]  nystatin (MYCOSTATIN/NYSTOP) powder Apply 1 Application topically daily as needed (itching and yeast).    [provider]  ondansetron (ZOFRAN-ODT) 4 MG  disintegrating tablet Take 1 tablet (4 mg total) by mouth every 8 (eight) hours as needed for nausea or vomiting. 06/26/23   Vanetta Mulders, MD  ondansetron (ZOFRAN-ODT) 8 MG disintegrating tablet Take 1 tablet (8 mg total) by mouth 3 (three) times daily before meals. 07/18/21   Johnson, Clanford L, MD  pantoprazole (PROTONIX) 40 MG tablet Take 40 mg by mouth 2 (two) times daily.    [provider]  prazosin (MINIPRESS) 5 MG capsule Take 2 mg by mouth at bedtime. 06/30/21   [provider]  pregabalin (LYRICA) 200 MG capsule Take 200 mg by mouth 3 (three) times daily.    [provider]  promethazine (PHENERGAN) 25 MG suppository     [provider]  promethazine (PHENERGAN) 6.25 MG/5ML syrup Take by mouth.    [provider]  propranolol (INDERAL) 40 MG tablet Take 40 mg by mouth 2 (two) times daily.  01/12/19   [provider]  QULIPTA 60 MG TABS Take 1 tablet by mouth at bedtime.    [provider]  simvastatin (ZOCOR) 20 MG tablet Take 10 mg by mouth daily at 6 PM.  12/25/18   [provider]  sucralfate (CARAFATE) 1 g tablet Take 1 g by mouth 3 (three) times daily. 07/11/21   [provider]  tadalafil (CIALIS) 5 MG tablet Take 1 tablet by mouth daily.    [provider]  topiramate (TOPAMAX) 100 MG tablet Take 100-200 mg by mouth 2 (two) times daily. Take 100 mg in the morning, Take 200 mg at bedtime 05/18/19   [provider]  TRINTELLIX 20 MG TABS tablet Take 20 mg by mouth daily. 01/12/22   [provider]  TRUDHESA 0.725 MG/ACT AERS Place 2  sprays into the nose daily.    [provider]  UBRELVY 100 MG TABS Take 100 mg by mouth daily as needed (migraines). 07/11/21   [provider]  Vibegron (GEMTESA) 75 MG TABS Take 75 mg by mouth daily.    [provider]  VRAYLAR 3 MG capsule Take 4.5 mg by mouth daily. 02/01/22   [provider]     Allergies:      Allergies  Allergen Reactions   Ciprofloxacin Hives     Physical Exam:   Vitals  Blood pressure 102/61, pulse 69, temperature (!) 97.4 F (36.3 C), temperature source Oral, resp. rate 14, height 6' (1.829 m), weight 86.2 kg, SpO2 98%.   1. General ,well developed male, lying in bed, no apparent distress  2. Normal affect and insight, Not Suicidal or Homicidal, Awake Alert, Oriented X 3.  3. No F.N deficits, ALL C.Nerves Intact, Strength 5/5 all 4 extremities, Sensation intact all 4 extremities, Plantars down going.  4. Ears and Eyes appear Normal, Conjunctivae clear, PERRLA. Moist Oral Mucosa.  5. Supple Neck, No JVD, No cervical lymphadenopathy appriciated, No Carotid Bruits.  6. Symmetrical Chest wall movement, Good air movement bilaterally, CTAB.  7. RRR, No Gallops, Rubs or Murmurs, No Parasternal Heave.  8. Positive Bowel Sounds, Abdomen Soft, No tenderness, No organomegaly appriciated,No rebound -guarding or rigidity.  9.  No Cyanosis, Normal Skin Turgor, No Skin Rash or Bruise.  10. Good muscle tone,  joints appear normal , no effusions, Normal ROM.    Data Review:    CBC Recent Labs  Lab 07/10/23 1252  WBC 13.3*  HGB 10.9*  HCT 32.6*  PLT 360  MCV 95.3  MCH 31.9  MCHC 33.4  RDW 13.0  LYMPHSABS 1.9  MONOABS 1.1*  EOSABS 0.2  BASOSABS 0.1   ------------------------------------------------------------------------------------------------------------------  Chemistries  Recent Labs  Lab 07/10/23 1252  NA 126*  K 4.3  CL 99  CO2 20*  GLUCOSE 871*  BUN 26*  CREATININE 1.27*  CALCIUM 8.0*  AST 31  ALT 28  ALKPHOS 124  BILITOT 0.2*   ------------------------------------------------------------------------------------------------------------------ estimated creatinine clearance is 89.1 mL/min (A) (by C-G formula based on SCr of 1.27 mg/dL  (H)). ------------------------------------------------------------------------------------------------------------------ No results for input(s): "TSH", "T4TOTAL", "T3FREE", "THYROIDAB" in the last 72 hours.  Invalid input(s): "FREET3"  Coagulation profile No results for input(s): "INR", "PROTIME" in the last 168 hours. ------------------------------------------------------------------------------------------------------------------- No results for input(s): "DDIMER" in the last 72 hours. -------------------------------------------------------------------------------------------------------------------  Cardiac Enzymes No results for input(s): "CKMB", "TROPONINI", "MYOGLOBIN" in the last 168 hours.  Invalid input(s): "CK" ------------------------------------------------------------------------------------------------------------------ No results found for: "BNP"   ---------------------------------------------------------------------------------------------------------------  Urinalysis    Component Value Date/Time   COLORURINE STRAW (A) 07/10/2023 1442   APPEARANCEUR CLEAR 07/10/2023 1442   LABSPEC 1.024 07/10/2023 1442   PHURINE 6.0 07/10/2023 1442   GLUCOSEU >=500 (A) 07/10/2023 1442   HGBUR MODERATE (A) 07/10/2023 1442   BILIRUBINUR NEGATIVE 07/10/2023 1442   KETONESUR NEGATIVE 07/10/2023 1442   PROTEINUR NEGATIVE 07/10/2023 1442   NITRITE NEGATIVE 07/10/2023 1442   LEUKOCYTESUR NEGATIVE 07/10/2023 1442    ----------------------------------------------------------------------------------------------------------------   Imaging Results:    CT Renal Stone Study  Result Date: 07/10/2023 CLINICAL DATA:  Abdominal and flank pain, right flank pain after recent lithotripsy EXAM: CT ABDOMEN AND PELVIS WITHOUT CONTRAST TECHNIQUE: Multidetector CT imaging of the abdomen and pelvis was performed following the standard protocol without IV contrast. RADIATION DOSE REDUCTION: This  exam was performed according to the departmental dose-optimization program  which includes automated exposure control, adjustment of the mA and/or kV according to patient size and/or use of iterative reconstruction technique. COMPARISON:  06/26/2023 FINDINGS: Lower chest: No acute pleural or parenchymal lung disease. Hepatobiliary: Cholecystectomy. Unremarkable unenhanced appearance of the liver. Pancreas: Postsurgical changes from probable prior pancreatectomy. Spleen: Prior splenectomy. Adrenals/Urinary Tract: The obstructing distal left ureteral calculus seen previously has migrated to the left UVJ, and measures 6 mm reference image 75/2. Mild persistent left-sided hydronephrosis and hydroureter. Stable punctate 2 mm nonobstructing calculus lower pole left kidney. Stable multiple nonobstructing right renal calculi, measuring up to 6 mm in size. The adrenals and bladder are otherwise unremarkable. Stomach/Bowel: No bowel obstruction or ileus. Normal appendix right lower quadrant. No bowel wall thickening or inflammatory change. Postsurgical changes from probable duodenectomy and gastrojejunostomy. Vascular/Lymphatic: No significant vascular findings are present. No enlarged abdominal or pelvic lymph nodes. Reproductive: Prostate is unremarkable. Other: No free fluid or free intraperitoneal gas. No abdominal wall hernia. Musculoskeletal: No acute or destructive bony abnormalities. Stable pelvic nerve stimulator. Reconstructed images demonstrate no additional findings. IMPRESSION: 1. Persistent left-sided hydronephrosis, with distal migration of the obstructing left ureteral calculus now located in the left UVJ. Calculus measures 6 mm. 2. Other bilateral nonobstructing renal calculi, unchanged. 3. Stable postsurgical changes as above. Electronically Signed   By: Sharlet Salina M.D.   On: 07/10/2023 16:18      Assessment & Plan:    Principal Problem:   HHNC (hyperglycemic hyperosmolar nonketotic coma)  (HCC) Active Problems:   Bipolar 1 disorder/BiPolar disorder/depression/PTSD/anxiety.--   Type 2 diabetes mellitus without complication (HCC)   GERD (gastroesophageal reflux disease)   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   History of migraine   Seizures (HCC)   Hyperglycemia with concern for hyperglycemic hyperosmolar nonketotic coma -Since with nausea, abdominal pain, significantly elevated blood glucose at 871, osmolality still pending, but pH within normal limit, anion gap within normal limit, bicarb is mildly low at 20 -Admitted under [facility] protocol, continue with insulin drip, aggressive IV fluid hydration, keep n.p.o., monitor electrolytes closely and replete as needed. -With no further nausea tolerate oral intake, and hopefully can be transitioning to his home dose to his 16 units daily (he already took today's dose). -No evidence of infectious process -Patient reports most recent A1c was 9.8 couple months ago  Left-sided hydronephrosis, with distal migration of obstructing left ureteral calculus at left UVJ, 6 mm stone -Nephrosis seems to be stable, as well renal function seems to be stable, he is status post lithotripsy at [facility] discussed with Dr. Ronne Binning, no intervention indicated currently, just hydrate, treat pain, and stone should pass -Will start on Flomax as recommended by his urologist at [facility] this will need to be continued at time of discharge  Seizure Continue with Keppra   GERD Continue with PPI  Bipolar 1 disorder/BiPolar disorder/depression/PTSD/anxiety Continue with Abilify   Auto islet cell transplant-insulin-dependent Continue Creon   Mixed hyperlipidemia Continue Zocor   History of migraine Continue ubrogepant   Patient on Truvada for protective reasons, does not have a diagnosis of HIV.  DVT Prophylaxis   Lovenox  AM Labs Ordered, also please review Full  Orders  Family Communication: Admission, patients condition and plan of care including tests being ordered have been discussed with the patient and husband at bedside who indicate understanding and agree with the plan and Code Status.  Code Status full code  Likely DC to home  Condition GUARDED  Consults called: ED discussed with urology Dr. Ronne Binning  Admission status: Observation  Time spent in minutes : 70 minutes   Huey Bienenstock M.D on 07/10/2023 at 5:47 PM   [facility] - Office  [phone]

## 2023-07-10 NOTE — ED Notes (Signed)
Patient transported to CT 

## 2023-07-10 NOTE — ED Triage Notes (Signed)
Pt comes in with complaints of lower abdominal pain. Pt states he saw his urologist at [facility] in [place] over a week ago and was called today stating he had abnormal labs and needed to go to the ED but was not sure what labs were abnormal. Pt states having a 5 mm kidney stone that he had Lithotripsy.

## 2023-07-10 NOTE — ED Provider Notes (Signed)
[place] EMERGENCY DEPARTMENT AT [facility] Provider Note   CSN: [id number] Arrival date & time: 07/10/23  1230     History  Chief Complaint  Patient presents with   Abdominal Pain    Dennis Yang is a 37 y.o. 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  He underwent lithotripsy by his urologist in [facility] and continues to pass small pieces of the stone, but has developed increasing generalized weakness, subjective fever, poor appetite and was told to come here for further evaluation after labs obtained in pcp's office yesterday were abnormal.  He states his cbg this am read "high".     The history is provided by the patient and the spouse.       Home Medications Prior to Admission medications   Medication Sig Start Date End Date Taking? Authorizing Provider  acetaminophen (TYLENOL) 325 MG tablet Take 2 tablets (650 mg total) by mouth every 6 (six) hours as needed for mild pain, fever or headache (or Fever >/= 101). 08/13/19   Emokpae, Courage, MD  amitriptyline (ELAVIL) 10 MG tablet Take 1 tablet by mouth at bedtime. 09/19/22   [provider]  anagrelide (AGRYLIN) 1 MG capsule Take 1 mg by mouth in the morning and at bedtime. 05/27/20   [provider]  ARIPiprazole (ABILIFY) 20 MG tablet Take 20 mg by mouth at bedtime. 07/11/21   [provider]  ATROVENT HFA 17 MCG/ACT inhaler Inhale 1 puff into the lungs every 4 (four) hours as needed for wheezing.  01/16/19   [provider]  Cholecalciferol (VITAMIN D3) 1.25 MG (50000 UT) CAPS Take 1 capsule by mouth every Monday, Wednesday, and  Friday. 3 times a week 12/24/18   [provider]  CREON 36000 units CPEP capsule Take 36,000-72,000 Units by mouth See admin instructions. Take 2 capsules by mouth with meals and 1 capsule with snacks 11/16/19   [provider]  cyclobenzaprine (FLEXERIL) 10 MG tablet Take 10 mg by mouth 3 (three) times daily as needed for muscle spasms. 04/19/20   [provider]  diclofenac Sodium (VOLTAREN) 1 % GEL Apply 2 g topically daily as needed (for pain).  11/23/19   [provider]  dicyclomine (BENTYL) 20 MG tablet Take 20 mg by mouth 4 (four) times daily as needed.    [provider]  Docusate Sodium (DSS) 100 MG CAPS Take 1 capsule by mouth daily.    [provider]  emtricitabine-tenofovir (TRUVADA) 200-300 MG tablet Take 1 tablet by mouth daily.    [provider]  eszopiclone (LUNESTA) 2 MG TABS tablet Take 1 tablet by mouth daily with breakfast. 07/05/22   [provider]  FEROSUL 325 (65 Fe) MG tablet Take 325 mg by mouth daily. 07/30/22   [provider]  fluticasone (FLONASE) 50 MCG/ACT nasal spray Place 1 spray into both nostrils daily.  01/20/19   [provider]  Glucagon (GVOKE HYPOPEN 2-PACK) 0.5 MG/0.1ML SOAJ Inject 0.5 mg into the skin as needed (low blood sugar).    [provider]  hydrochlorothiazide (HYDRODIURIL) 12.5 MG tablet Take 12.5 mg by mouth daily.    [provider]  HYDROcodone-acetaminophen (NORCO/VICODIN) 5-325 MG tablet Take 1 tablet by mouth every 6 (six) hours as needed. 06/26/23   Vanetta Mulders, MD  hydrOXYzine (ATARAX/VISTARIL) 25 MG tablet Take 25 mg by mouth 4 (four) times daily. 07/11/21   [provider]  IBU 800 MG tablet Take 800 mg by mouth every 8 (eight) hours as needed for moderate pain.    [provider]  LANTUS SOLOSTAR 100 UNIT/ML Solostar Pen 16 Units at bedtime. 07/04/22   [provider]  levETIRAcetam (KEPPRA) 750 MG tablet  Take 750 mg by mouth in the morning and at bedtime.    [provider]  Lifitegrast Benay Spice) 5 % SOLN Apply 1 drop to eye 2 (two) times daily.    [provider]  meclizine (ANTIVERT) 25 MG tablet Take 25 mg by mouth every 6 (six) hours as needed for dizziness or nausea.  10/01/19   [provider]  meloxicam (MOBIC) 15 MG tablet Take 15 mg by mouth daily. 07/13/20   [provider]  methenamine (HIPREX) 1 g tablet Take 1 g by mouth 2 (two) times daily with a meal.    [provider]  MOTEGRITY 2 MG TABS Take 1 tablet by mouth daily.    [provider]  nitroGLYCERIN (NITROSTAT) 0.4 MG SL tablet Place 0.4 mg under the tongue every 5 (five) minutes as needed for chest pain.  08/18/19   [provider]  nystatin (MYCOSTATIN/NYSTOP) powder Apply 1 Application topically daily as needed (itching and yeast).    [provider]  ondansetron (ZOFRAN-ODT) 4 MG disintegrating tablet Take 1 tablet (4 mg total) by mouth every 8 (eight) hours as needed for nausea or vomiting. 06/26/23   Vanetta Mulders, MD  ondansetron (ZOFRAN-ODT) 8 MG disintegrating tablet Take 1 tablet (8 mg total) by mouth 3 (three) times daily before meals. 07/18/21   Johnson, Clanford L, MD  pantoprazole (PROTONIX) 40 MG tablet Take 40 mg by mouth 2 (two) times daily.    [provider]  prazosin (MINIPRESS) 5 MG capsule Take 2 mg by mouth at bedtime. 06/30/21   [provider]  pregabalin (LYRICA) 200 MG capsule Take 200 mg by mouth 3 (three) times daily.    [provider]  promethazine (PHENERGAN) 25 MG suppository     [provider]  promethazine (PHENERGAN) 6.25 MG/5ML syrup Take by mouth.    [provider]  propranolol (INDERAL) 40 MG tablet Take 40 mg by mouth 2 (two) times daily.  01/12/19   [provider]  QULIPTA 60 MG TABS Take 1 tablet by mouth at bedtime.    [provider]  simvastatin (ZOCOR) 20 MG  tablet Take 10 mg by mouth daily at 6 PM.  12/25/18   [provider]  sucralfate (CARAFATE) 1 g tablet Take 1 g by mouth 3 (three) times daily. 07/11/21   [provider]  tadalafil (CIALIS) 5 MG tablet Take 1 tablet by mouth daily.    [provider]  topiramate (TOPAMAX) 100 MG tablet Take 100-200 mg by mouth 2 (two) times daily. Take 100 mg in the morning, Take 200 mg at bedtime 05/18/19   [provider]  TRINTELLIX 20 MG TABS tablet Take 20 mg by mouth daily. 01/12/22   [provider]  TRUDHESA 0.725 MG/ACT AERS Place 2 sprays into the nose daily.    [provider]  UBRELVY 100 MG TABS Take 100 mg by mouth daily as needed (migraines). 07/11/21  [provider]  Vibegron (GEMTESA) 75 MG TABS Take 75 mg by mouth daily.    [provider]  VRAYLAR 3 MG capsule Take 4.5 mg by mouth daily. 02/01/22   [provider]      Allergies    Ciprofloxacin    Review of Systems   Review of Systems  Constitutional:  Positive for appetite change, fatigue and fever. Negative for chills.  HENT:  Negative for congestion.   Eyes: Negative.   Respiratory:  Negative for chest tightness and shortness of breath.   Cardiovascular:  Negative for chest pain.  Gastrointestinal:  Positive for abdominal pain and nausea. Negative for diarrhea and vomiting.  Genitourinary: Negative.   Musculoskeletal:  Negative for arthralgias, joint swelling and neck pain.  Skin: Negative.  Negative for rash and wound.  Neurological:  Positive for weakness. Negative for dizziness, light-headedness, numbness and headaches.  Psychiatric/Behavioral: Negative.      Physical Exam Updated Vital Signs BP (!) 109/59   Pulse 71   Temp (!) 97.4 F (36.3 C) (Oral)   Resp 16   Ht 6' (1.829 m)   Wt 86.2 kg   SpO2 95%   BMI 25.77 kg/m  Physical Exam Vitals and nursing note reviewed.  Constitutional:      Appearance: He is well-developed.  HENT:     Head:  Normocephalic and atraumatic.  Eyes:     Conjunctiva/sclera: Conjunctivae normal.  Cardiovascular:     Rate and Rhythm: Normal rate and regular rhythm.     Heart sounds: Normal heart sounds.  Pulmonary:     Effort: Pulmonary effort is normal.     Breath sounds: Normal breath sounds. No wheezing.  Abdominal:     General: Bowel sounds are normal.     Palpations: Abdomen is soft.     Tenderness: There is abdominal tenderness in the left upper quadrant and left lower quadrant. There is no guarding or rebound.  Musculoskeletal:        General: Normal range of motion.     Cervical back: Normal range of motion.  Skin:    General: Skin is warm and dry.  Neurological:     Mental Status: He is alert.     ED Results / Procedures / Treatments   Labs (all labs ordered are listed, but only abnormal results are displayed) Labs Reviewed  CBC WITH DIFFERENTIAL/PLATELET - Abnormal; Notable for the following components:      Result Value   WBC 13.3 (*)    RBC 3.42 (*)    Hemoglobin 10.9 (*)    HCT 32.6 (*)    Neutro Abs 10.0 (*)    Monocytes Absolute 1.1 (*)    All other components within normal limits  COMPREHENSIVE METABOLIC PANEL - Abnormal; Notable for the following components:   Sodium 126 (*)    CO2 20 (*)    Glucose, Bld 871 (*)    BUN 26 (*)    Creatinine, Ser 1.27 (*)    Calcium 8.0 (*)    Total Protein 6.1 (*)    Total Bilirubin 0.2 (*)    All other components within normal limits  URINALYSIS, ROUTINE W REFLEX MICROSCOPIC - Abnormal; Notable for the following components:   Color, Urine STRAW (*)    Glucose, UA >=500 (*)    Hgb urine dipstick MODERATE (*)    All other components within normal limits  CBG MONITORING, ED - Abnormal; Notable for the following components:   Glucose-Capillary >600 (*)  All other components within normal limits  CBG MONITORING, ED - Abnormal; Notable for the following components:   Glucose-Capillary 530 (*)    All other components within  normal limits  BETA-HYDROXYBUTYRIC ACID    EKG None  Radiology CT Renal Stone Study  Result Date: 07/10/2023 CLINICAL DATA:  Abdominal and flank pain, right flank pain after recent lithotripsy EXAM: CT ABDOMEN AND PELVIS WITHOUT CONTRAST TECHNIQUE: Multidetector CT imaging of the abdomen and pelvis was performed following the standard protocol without IV contrast. RADIATION DOSE REDUCTION: This exam was performed according to the departmental dose-optimization program which includes automated exposure control, adjustment of the mA and/or kV according to patient size and/or use of iterative reconstruction technique. COMPARISON:  06/26/2023 FINDINGS: Lower chest: No acute pleural or parenchymal lung disease. Hepatobiliary: Cholecystectomy. Unremarkable unenhanced appearance of the liver. Pancreas: Postsurgical changes from probable prior pancreatectomy. Spleen: Prior splenectomy. Adrenals/Urinary Tract: The obstructing distal left ureteral calculus seen previously has migrated to the left UVJ, and measures 6 mm reference image 75/2. Mild persistent left-sided hydronephrosis and hydroureter. Stable punctate 2 mm nonobstructing calculus lower pole left kidney. Stable multiple nonobstructing right renal calculi, measuring up to 6 mm in size. The adrenals and bladder are otherwise unremarkable. Stomach/Bowel: No bowel obstruction or ileus. Normal appendix right lower quadrant. No bowel wall thickening or inflammatory change. Postsurgical changes from probable duodenectomy and gastrojejunostomy. Vascular/Lymphatic: No significant vascular findings are present. No enlarged abdominal or pelvic lymph nodes. Reproductive: Prostate is unremarkable. Other: No free fluid or free intraperitoneal gas. No abdominal wall hernia. Musculoskeletal: No acute or destructive bony abnormalities. Stable pelvic nerve stimulator. Reconstructed images demonstrate no additional findings. IMPRESSION: 1. Persistent left-sided  hydronephrosis, with distal migration of the obstructing left ureteral calculus now located in the left UVJ. Calculus measures 6 mm. 2. Other bilateral nonobstructing renal calculi, unchanged. 3. Stable postsurgical changes as above. Electronically Signed   By: Sharlet Salina M.D.   On: 07/10/2023 16:18    Procedures Procedures    Medications Ordered in ED Medications  sodium chloride 0.9 % bolus 1,000 mL (0 mLs Intravenous Stopped 07/10/23 1526)  insulin aspart (novoLOG) injection 10 Units (10 Units Subcutaneous Given 07/10/23 1355)  ondansetron (ZOFRAN) injection 4 mg (4 mg Intravenous Given 07/10/23 1442)  morphine (PF) 4 MG/ML injection 4 mg (4 mg Intravenous Given 07/10/23 1443)    ED Course/ Medical Decision Making/ A&P                                 Medical Decision Making Patient presenting with persistent abdominal pain and hyperglycemia, known kidney stone, status post lithotripsy 1 week ago and has passed small stone fragments.  Denies fevers, has had dysuria.  Blood glucose meter has been reading "high" despite compliance with his home medications.  Differential diagnosis including DKA versus HHS, known kidney stone status post lithotripsy, concern for persistent ureteral stone with possibility of urinary tract infection or abscess.  Patient's initial blood glucose was 871, it improved to 530 after receiving IV fluids and 10 units of insulin aspart.  He is not in DKA, therefore HHS protocol initiated for further treatment of his hyperglycemia.  Beta hydroxybutyrate currently pending.  He does not have a UTI, the 6 mm stone is at the UVJ so it has not moved significantly, Flomax added.  Urology in [place], [place] who advised pt present here given hyperglycemia.  Pt will need admission for diabetes control,  may need urology consult while here.  His urologist had ordered flomax which he had not yet started,  given first dose here.   Amount and/or Complexity of Data Reviewed Labs:  ordered.    Details: Corrected Na++ 138 Glucose 871 Co2 20 Anion gap 7 Creatinine 1.27, Was 1.46 2 weeks ago WBC 13.3 Radiology: ordered.    Details: CT renal stone study completed secondary to continued left-sided pain, the previously documented 5 x 7 x 8 ureteral stone now measures 6 mm at the UVJ,  Discussion of management or test interpretation with external provider(s): Patient discussed with Dr. Ronne Binning who agrees with treatment plan of Flomax, treating pain, suspect this stone will pass spontaneously, no need for stenting or other ureteral intervention at this time.  Call placed to hospitalist, Dr. Randol Kern accepts patient for admission.  Risk Decision regarding hospitalization.           Final Clinical Impression(s) / ED Diagnoses Final diagnoses:  Hyperglycemia due to diabetes mellitus (HCC)  Ureteral stone with hydronephrosis    Rx / DC Orders ED Discharge Orders          Ordered    Beta-hydroxybutyric acid  Status:  Canceled        07/10/23 1519              Burgess Amor, PA-C 07/10/23 1735    Kommor, Madison, MD 07/10/23 [phone]

## 2023-07-10 NOTE — Progress Notes (Addendum)
Notified Dr Delrae Rend of bp 86/47 map 78 and very lethargic. Responds to loud verbal and tactile stimuli but falls asleep mid sentence when answering questions. He is alert to person/place/situation but states the year is 2023.

## 2023-07-11 ENCOUNTER — Other Ambulatory Visit (HOSPITAL_COMMUNITY): Payer: Self-pay

## 2023-07-11 ENCOUNTER — Telehealth (HOSPITAL_COMMUNITY): Payer: Self-pay | Admitting: Pharmacy Technician

## 2023-07-11 DIAGNOSIS — E119 Type 2 diabetes mellitus without complications: Secondary | ICD-10-CM | POA: Diagnosis not present

## 2023-07-11 DIAGNOSIS — Z90410 Acquired total absence of pancreas: Secondary | ICD-10-CM

## 2023-07-11 DIAGNOSIS — E1100 Type 2 diabetes mellitus with hyperosmolarity without nonketotic hyperglycemic-hyperosmolar coma (NKHHC): Secondary | ICD-10-CM | POA: Diagnosis not present

## 2023-07-11 DIAGNOSIS — Z794 Long term (current) use of insulin: Secondary | ICD-10-CM

## 2023-07-11 DIAGNOSIS — E1101 Type 2 diabetes mellitus with hyperosmolarity with coma: Secondary | ICD-10-CM | POA: Diagnosis not present

## 2023-07-11 LAB — BLOOD GAS, VENOUS
Acid-base deficit: 1.4 mmol/L (ref 0.0–2.0)
Bicarbonate: 24.8 mmol/L (ref 20.0–28.0)
Drawn by: 1528
O2 Saturation: 81.1 %
Patient temperature: 36.6
pCO2, Ven: 46 mmHg (ref 44–60)
pH, Ven: 7.34 (ref 7.25–7.43)
pO2, Ven: 47 mmHg — ABNORMAL HIGH (ref 32–45)

## 2023-07-11 LAB — GLUCOSE, CAPILLARY
Glucose-Capillary: 360 mg/dL — ABNORMAL HIGH (ref 70–99)
Glucose-Capillary: 401 mg/dL — ABNORMAL HIGH (ref 70–99)
Glucose-Capillary: 286 mg/dL — ABNORMAL HIGH (ref 70–99)
Glucose-Capillary: 68 mg/dL — ABNORMAL LOW (ref 70–99)
Glucose-Capillary: 90 mg/dL (ref 70–99)

## 2023-07-11 LAB — GLUCOSE, RANDOM: Glucose, Bld: 359 mg/dL — ABNORMAL HIGH (ref 70–99)

## 2023-07-11 MED ORDER — INSULIN GLARGINE-YFGN 100 UNIT/ML ~~LOC~~ SOLN
40.0000 [IU] | Freq: Every day | SUBCUTANEOUS | Status: DC
  Administered 2023-07-11: 40 [IU] via SUBCUTANEOUS
  Filled 2023-07-11 (×2): qty 0.4

## 2023-07-11 MED ORDER — INSULIN LISPRO (1 UNIT DIAL) 100 UNIT/ML (KWIKPEN): 10.0000 [IU] | PEN_INJECTOR | Freq: Three times a day (TID) | SUBCUTANEOUS | 3 refills | Status: AC

## 2023-07-11 MED ORDER — INSULIN ASPART 100 UNIT/ML IJ SOLN
14.0000 [IU] | Freq: Three times a day (TID) | INTRAMUSCULAR | Status: DC
  Administered 2023-07-11 (×3): 14 [IU] via SUBCUTANEOUS

## 2023-07-11 MED ORDER — CHLORHEXIDINE GLUCONATE CLOTH 2 % EX PADS
6.0000 | MEDICATED_PAD | Freq: Every day | CUTANEOUS | Status: DC
  Administered 2023-07-11: 6 via TOPICAL

## 2023-07-11 MED ORDER — LANTUS SOLOSTAR 100 UNIT/ML ~~LOC~~ SOPN: 30.0000 [IU] | PEN_INJECTOR | Freq: Every day | SUBCUTANEOUS | 3 refills | Status: AC

## 2023-07-11 MED ORDER — HEPARIN SOD (PORK) LOCK FLUSH 10 UNIT/ML IV SOLN: 10.0000 [IU] | Freq: Once | INTRAVENOUS | Status: DC

## 2023-07-11 MED ORDER — INSULIN PEN NEEDLE 31G X 5 MM MISC: 1.0000 | 3 refills | Status: AC

## 2023-07-11 MED ORDER — LANTUS SOLOSTAR 100 UNIT/ML ~~LOC~~ SOPN: 40.0000 [IU] | PEN_INJECTOR | Freq: Every day | SUBCUTANEOUS | 3 refills | Status: DC

## 2023-07-11 MED ORDER — INSULIN LISPRO (1 UNIT DIAL) 100 UNIT/ML (KWIKPEN): 14.0000 [IU] | PEN_INJECTOR | Freq: Three times a day (TID) | SUBCUTANEOUS | 3 refills | Status: DC

## 2023-07-11 MED ORDER — HEPARIN SOD (PORK) LOCK FLUSH 100 UNIT/ML IV SOLN
500.0000 [IU] | Freq: Once | INTRAVENOUS | Status: AC
  Administered 2023-07-11: 500 [IU] via INTRAVENOUS
  Filled 2023-07-11: qty 5

## 2023-07-11 MED ORDER — FREESTYLE LIBRE 3 SENSOR MISC: 1.0000 | 3 refills | Status: AC

## 2023-07-11 NOTE — Discharge Instructions (Signed)
IMPORTANT INFORMATION: PAY CLOSE ATTENTION   PHYSICIAN DISCHARGE INSTRUCTIONS  Follow with Primary care provider  Grabowski, Kristen, FNP  and other consultants as instructed by your Hospitalist Physician  SEEK MEDICAL CARE OR RETURN TO EMERGENCY ROOM IF SYMPTOMS COME BACK, WORSEN OR NEW PROBLEM DEVELOPS   Please note: You were cared for by a hospitalist during your hospital stay. Every effort will be made to forward records to your primary care provider.  You can request that your primary care provider send for your hospital records if they have not received them.  Once you are discharged, your primary care physician will handle any further medical issues. Please note that NO REFILLS for any discharge medications will be authorized once you are discharged, as it is imperative that you return to your primary care physician (or establish a relationship with a primary care physician if you do not have one) for your post hospital discharge needs so that they can reassess your need for medications and monitor your lab values.  Please get a complete blood count and chemistry panel checked by your Primary MD at your next visit, and again as instructed by your Primary MD.  Get Medicines reviewed and adjusted: Please take all your medications with you for your next visit with your Primary MD  Laboratory/radiological data: Please request your Primary MD to go over all hospital tests and procedure/radiological results at the follow up, please ask your primary care provider to get all Hospital records sent to his/her office.  In some cases, they will be blood work, cultures and biopsy results pending at the time of your discharge. Please request that your primary care provider follow up on these results.  If you are diabetic, please bring your blood sugar readings with you to your follow up appointment with primary care.    Please call and make your follow up appointments as soon as possible.    Also  Note the following: If you experience worsening of your admission symptoms, develop shortness of breath, life threatening emergency, suicidal or homicidal thoughts you must seek medical attention immediately by calling 911 or calling your MD immediately  if symptoms less severe.  You must read complete instructions/literature along with all the possible adverse reactions/side effects for all the Medicines you take and that have been prescribed to you. Take any new Medicines after you have completely understood and accpet all the possible adverse reactions/side effects.   Do not drive when taking Pain medications or sleeping medications (Benzodiazepines)  Do not take more than prescribed Pain, Sleep and Anxiety Medications. It is not advisable to combine anxiety,sleep and pain medications without talking with your primary care practitioner  Special Instructions: If you have smoked or chewed Tobacco  in the last 2 yrs please stop smoking, stop any regular Alcohol  and or any Recreational drug use.  Wear Seat belts while driving.  Do not drive if taking any narcotic, mind altering or controlled substances or recreational drugs or alcohol.       

## 2023-07-11 NOTE — Progress Notes (Signed)
Patient instructed about all medications, high and low blood sugar signs and symptoms. Patient freestyle libra placed in skin, app installed on phone and ready for use. Use of device explained.

## 2023-07-11 NOTE — Discharge Summary (Addendum)
Physician Discharge Summary  Dennis Yang UJW:[id number] DOB: [birth year] DOA: 07/10/2023  PCP: Tacy Learn, FNP  Admit date: 07/10/2023 Discharge date: 07/11/2023  Admitted From:  Home  Disposition:  Home   Recommendations for Outpatient Follow-up:  Follow up with PCP in 1 weeks Follow up with endocrinologist in [place] as soon as possible in next 2-3 weeks Please bring freestyle 3 to appointment so it can be reviewed by endocrinologist  Discharge Condition: STABLE   CODE STATUS: FULL  DIET: resume carb modified    Brief Hospitalization Summary: Please see all hospital notes, images, labs for full details of the hospitalization. Admission provider HPI:  37 y.o. 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, chronic back pain, seizure disorder, prior alcohol abuse, neuropathy,  PTSD , anxiety, depression, fibromyalgia overactive bladder, IBS ,Hx PAF ,Hx Hep C s/p SVR (Mavyret x 8wks in 2018) gerd, esophageal stricture, IBS, gastroparesis no tobacco,  drugs,  patient presents to ED secondary to complaints of abdominal pain, reports persistent left flank pain, and lower abdominal pain, he was recently diagnosed with 5 x 7 x 8 mm left distal ureteral kidney stones at [facility], ED on 06/26/2023, he underwent lithotripsy by his urologist at [facility], patient continues to pass small pieces of the stone, he did develop generalized weakness, fatigue and worsening pain, he does report subjective fever, poor appetite, reports he has been compliant with his insulin though, had follow-up with his urologist yesterday, and he asked to come to ED given his blood sugar is very high. -In ED patient was noted to have a glucose of 871, bicarb of, but pH and anion gap within normal limit, CT renal protocol has been obtained, showing stable hydronephrosis, with distal stone 6 mm at UVJ, ED physician discussed  with on-call urologist who reported patient may stay at any pain, no intervention currently bedside pain control, hydration and Flomax and stone should pass, [facility] hospitalist consulted to admit for concern of HHNC.   HOSPITAL COURSE BY PROBLEM  Hyperglycemia with concern for hyperglycemic hyperosmolar nonketotic coma -Since with nausea, abdominal pain, significantly elevated blood glucose at 871, osmolality still pending, but pH within normal limit, anion gap within normal limit, bicarb is mildly low at 20 -Admitted under [facility]ONKS protocol, treated with insulin drip, aggressive IV fluid hydration, keep n.p.o., monitor electrolytes closely and replete as needed. -he has now been transitioned to subcutanous insulin, lantus 30 units daily, novolog 10 units TID with meals.  He has been provided with samples and new Rxs for insulin, pen needles and Freestye 3 libre devices.  Prior authorization completed for Freestyle libre 3 devices done by [facility] pharmacist.   -No evidence of infectious process -Patient reports most recent A1c was 9.8 couple months ago -pt advised to follow up with PCP in the week and endocrinologist in next 2-3 weeks and please bring freestyle libre to office visit for download and review by endocrinologist.    Left-sided hydronephrosis, with distal migration of obstructing left ureteral calculus at left UVJ, 6 mm stone -Nephrosis seems to be stable, as well renal function seems to be stable, he is status post lithotripsy at [facility] discussed with Dr. Ronne Binning, no intervention indicated currently, just hydrate, treat pain, and stone should pass -He is on prazosin 5 mg daily and will need to follow up with his urologist at [facility]   Seizure Continue with Keppra   GERD Continue with PPI   Bipolar 1 disorder/BiPolar disorder/depression/PTSD/anxiety  Continue with Abilify   Auto islet cell transplant-insulin-dependent Continue Creon   Mixed hyperlipidemia Continue  Zocor   History of migraine Continue ubrogepant   Patient on Truvada for protective reasons, does not have a diagnosis of HIV.   Discharge Diagnoses:  Principal Problem:   HHNC (hyperglycemic hyperosmolar nonketotic coma) (HCC) Active Problems:   Bipolar 1 disorder/BiPolar disorder/depression/PTSD/anxiety.--   Type 2 diabetes mellitus without complication (HCC)   GERD (gastroesophageal reflux disease)   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   History of migraine   Seizures (HCC)   Discharge Instructions:  Allergies as of 07/11/2023       Reactions   Ciprofloxacin Hives        Medication List     STOP taking these medications    HYDROcodone-acetaminophen 5-325 MG tablet Commonly known as: NORCO/VICODIN   insulin lispro 100 UNIT/ML injection Commonly known as: HUMALOG Replaced by: insulin lispro 100 UNIT/ML KwikPen       TAKE these medications    acetaminophen 325 MG tablet Commonly known as: TYLENOL Take 2 tablets (650 mg total) by mouth every 6 (six) hours as needed for mild pain, fever or headache (or Fever >/= 101).   amitriptyline 25 MG tablet Commonly known as: ELAVIL Take 25 mg by mouth at bedtime.   Atrovent HFA 17 MCG/ACT inhaler Generic drug: ipratropium Inhale 1 puff into the lungs every 4 (four) hours as needed for wheezing.   Creon 36000-114000 units Cpep capsule Generic drug: lipase/protease/amylase Take 36,000-72,000 Units by mouth See admin instructions. Take 2 capsules by mouth with meals and 1 capsule with snacks   cyclobenzaprine 10 MG tablet Commonly known as: FLEXERIL Take 10 mg by mouth 3 (three) times daily as needed for muscle spasms.   diclofenac Sodium 1 % Gel Commonly known as: VOLTAREN Apply 2 g topically daily as needed (for pain).   dicyclomine 20 MG tablet Commonly known as: BENTYL Take 20 mg by mouth 4 (four) times daily as needed for spasms.    DSS 100 MG Caps Take 1 capsule by mouth daily.   emtricitabine-tenofovir 200-300 MG tablet Commonly known as: TRUVADA Take 1 tablet by mouth daily.   Eszopiclone 3 MG Tabs Take 3 mg by mouth at bedtime. Take immediately before bedtime   FeroSul 325 (65 Fe) MG tablet Generic drug: ferrous sulfate Take 325 mg by mouth daily.   fluticasone 50 MCG/ACT nasal spray Commonly known as: FLONASE Place 1 spray into both nostrils daily.   FreeStyle Libre 3 Sensor Misc 1 Device by Does not apply route as directed. Place 1 sensor on the skin every 14 days. Use to check glucose continuously   Gemtesa 75 MG Tabs Generic drug: Vibegron Take 75 mg by mouth daily.   Gvoke HypoPen 2-Pack 0.5 MG/0.1ML Soaj Generic drug: Glucagon Inject 0.5 mg into the skin as needed (low blood sugar).   hydrOXYzine 50 MG tablet Commonly known as: ATARAX Take 50 mg by mouth 3 (three) times daily.   IBU 800 MG tablet Generic drug: ibuprofen Take 800 mg by mouth every 8 (eight) hours as needed for moderate pain.   insulin lispro 100 UNIT/ML KwikPen Commonly known as: HumaLOG KwikPen Inject 10 Units into the skin with breakfast, with lunch, and with evening meal. Replaces: insulin lispro 100 UNIT/ML injection   Insulin Pen Needle 31G X 5 MM Misc 1 Device by Does not apply route as directed.   Lantus SoloStar 100 UNIT/ML Solostar  Pen Generic drug: insulin glargine Inject 30 Units into the skin daily. What changed:  how much to take how to take this when to take this   levETIRAcetam 750 MG tablet Commonly known as: KEPPRA Take 750 mg by mouth in the morning and at bedtime.   meclizine 25 MG tablet Commonly known as: ANTIVERT Take 25 mg by mouth every 6 (six) hours as needed for dizziness or nausea.   meloxicam 15 MG tablet Commonly known as: MOBIC Take 15 mg by mouth daily.   methenamine 1 g tablet Commonly known as: HIPREX Take 1 g by mouth 2 (two) times daily with a meal.   Motegrity 2  MG Tabs Generic drug: Prucalopride Succinate Take 1 tablet by mouth daily.   nitroGLYCERIN 0.4 MG SL tablet Commonly known as: NITROSTAT Place 0.4 mg under the tongue every 5 (five) minutes as needed for chest pain.   nystatin powder Commonly known as: MYCOSTATIN/NYSTOP Apply 1 Application topically daily as needed (itching and yeast).   ondansetron 4 MG disintegrating tablet Commonly known as: ZOFRAN-ODT Take 1 tablet (4 mg total) by mouth every 8 (eight) hours as needed for nausea or vomiting. What changed: Another medication with the same name was removed. Continue taking this medication, and follow the directions you see here.   pantoprazole 40 MG tablet Commonly known as: PROTONIX Take 40 mg by mouth 2 (two) times daily.   prazosin 5 MG capsule Commonly known as: MINIPRESS Take 5 mg by mouth at bedtime.   pregabalin 200 MG capsule Commonly known as: LYRICA Take 200 mg by mouth 3 (three) times daily.   propranolol 40 MG tablet Commonly known as: INDERAL Take 40 mg by mouth 2 (two) times daily.   Qulipta 60 MG Tabs Generic drug: Atogepant Take 1 tablet by mouth at bedtime.   simvastatin 10 MG tablet Commonly known as: ZOCOR Take 10 mg by mouth daily.   sucralfate 1 g tablet Commonly known as: CARAFATE Take 1 g by mouth 3 (three) times daily.   tadalafil 5 MG tablet Commonly known as: CIALIS Take 1 tablet by mouth daily.   topiramate 100 MG tablet Commonly known as: TOPAMAX Take 100-200 mg by mouth 2 (two) times daily. Take 100 mg by mouth in the morning, then Take 200 mg by mouth  at bedtime   Trintellix 10 MG Tabs tablet Generic drug: vortioxetine HBr Take 10 mg by mouth daily.   Trudhesa 0.725 MG/ACT Aers Generic drug: Dihydroergotamine Mesylate HFA Place 2 sprays into the nose daily.   Ubrelvy 100 MG Tabs Generic drug: Ubrogepant Take 100 mg by mouth daily as needed (migraines).   Vitamin D3 1.25 MG (50000 UT) Caps Take 1 capsule by mouth See  admin instructions. Take one tablet by mouth every Monday, Wednesday and Fridays only per patient   Vraylar 4.5 MG Caps Generic drug: Cariprazine HCl Take 1 capsule by mouth daily at 6 (six) AM.   Xiidra 5 % Soln Generic drug: Lifitegrast Place 1 drop into both eyes 2 (two) times daily.        Follow-up Information     Tacy Learn, FNP. Schedule an appointment as soon as possible for a visit in 1 week(s).   Specialty: Family Medicine Why: Hospital Follow Up Contact information: [address] [place] [address] [phone]                Allergies  Allergen Reactions   Ciprofloxacin Hives   Allergies as of 07/11/2023  Reactions   Ciprofloxacin Hives        Medication List     STOP taking these medications    HYDROcodone-acetaminophen 5-325 MG tablet Commonly known as: NORCO/VICODIN   insulin lispro 100 UNIT/ML injection Commonly known as: HUMALOG Replaced by: insulin lispro 100 UNIT/ML KwikPen       TAKE these medications    acetaminophen 325 MG tablet Commonly known as: TYLENOL Take 2 tablets (650 mg total) by mouth every 6 (six) hours as needed for mild pain, fever or headache (or Fever >/= 101).   amitriptyline 25 MG tablet Commonly known as: ELAVIL Take 25 mg by mouth at bedtime.   Atrovent HFA 17 MCG/ACT inhaler Generic drug: ipratropium Inhale 1 puff into the lungs every 4 (four) hours as needed for wheezing.   Creon 36000-114000 units Cpep capsule Generic drug: lipase/protease/amylase Take 36,000-72,000 Units by mouth See admin instructions. Take 2 capsules by mouth with meals and 1 capsule with snacks   cyclobenzaprine 10 MG tablet Commonly known as: FLEXERIL Take 10 mg by mouth 3 (three) times daily as needed for muscle spasms.   diclofenac Sodium 1 % Gel Commonly known as: VOLTAREN Apply 2 g topically daily as needed (for pain).   dicyclomine 20 MG tablet Commonly known as: BENTYL Take 20 mg by mouth 4  (four) times daily as needed for spasms.   DSS 100 MG Caps Take 1 capsule by mouth daily.   emtricitabine-tenofovir 200-300 MG tablet Commonly known as: TRUVADA Take 1 tablet by mouth daily.   Eszopiclone 3 MG Tabs Take 3 mg by mouth at bedtime. Take immediately before bedtime   FeroSul 325 (65 Fe) MG tablet Generic drug: ferrous sulfate Take 325 mg by mouth daily.   fluticasone 50 MCG/ACT nasal spray Commonly known as: FLONASE Place 1 spray into both nostrils daily.   FreeStyle Libre 3 Sensor Misc 1 Device by Does not apply route as directed. Place 1 sensor on the skin every 14 days. Use to check glucose continuously   Gemtesa 75 MG Tabs Generic drug: Vibegron Take 75 mg by mouth daily.   Gvoke HypoPen 2-Pack 0.5 MG/0.1ML Soaj Generic drug: Glucagon Inject 0.5 mg into the skin as needed (low blood sugar).   hydrOXYzine 50 MG tablet Commonly known as: ATARAX Take 50 mg by mouth 3 (three) times daily.   IBU 800 MG tablet Generic drug: ibuprofen Take 800 mg by mouth every 8 (eight) hours as needed for moderate pain.   insulin lispro 100 UNIT/ML KwikPen Commonly known as: HumaLOG KwikPen Inject 10 Units into the skin with breakfast, with lunch, and with evening meal. Replaces: insulin lispro 100 UNIT/ML injection   Insulin Pen Needle 31G X 5 MM Misc 1 Device by Does not apply route as directed.   Lantus SoloStar 100 UNIT/ML Solostar Pen Generic drug: insulin glargine Inject 30 Units into the skin daily. What changed:  how much to take how to take this when to take this   levETIRAcetam 750 MG tablet Commonly known as: KEPPRA Take 750 mg by mouth in the morning and at bedtime.   meclizine 25 MG tablet Commonly known as: ANTIVERT Take 25 mg by mouth every 6 (six) hours as needed for dizziness or nausea.   meloxicam 15 MG tablet Commonly known as: MOBIC Take 15 mg by mouth daily.   methenamine 1 g tablet Commonly known as: HIPREX Take 1 g by mouth 2 (two)  times daily with a meal.   Motegrity 2 MG Tabs  Generic drug: Prucalopride Succinate Take 1 tablet by mouth daily.   nitroGLYCERIN 0.4 MG SL tablet Commonly known as: NITROSTAT Place 0.4 mg under the tongue every 5 (five) minutes as needed for chest pain.   nystatin powder Commonly known as: MYCOSTATIN/NYSTOP Apply 1 Application topically daily as needed (itching and yeast).   ondansetron 4 MG disintegrating tablet Commonly known as: ZOFRAN-ODT Take 1 tablet (4 mg total) by mouth every 8 (eight) hours as needed for nausea or vomiting. What changed: Another medication with the same name was removed. Continue taking this medication, and follow the directions you see here.   pantoprazole 40 MG tablet Commonly known as: PROTONIX Take 40 mg by mouth 2 (two) times daily.   prazosin 5 MG capsule Commonly known as: MINIPRESS Take 5 mg by mouth at bedtime.   pregabalin 200 MG capsule Commonly known as: LYRICA Take 200 mg by mouth 3 (three) times daily.   propranolol 40 MG tablet Commonly known as: INDERAL Take 40 mg by mouth 2 (two) times daily.   Qulipta 60 MG Tabs Generic drug: Atogepant Take 1 tablet by mouth at bedtime.   simvastatin 10 MG tablet Commonly known as: ZOCOR Take 10 mg by mouth daily.   sucralfate 1 g tablet Commonly known as: CARAFATE Take 1 g by mouth 3 (three) times daily.   tadalafil 5 MG tablet Commonly known as: CIALIS Take 1 tablet by mouth daily.   topiramate 100 MG tablet Commonly known as: TOPAMAX Take 100-200 mg by mouth 2 (two) times daily. Take 100 mg by mouth in the morning, then Take 200 mg by mouth  at bedtime   Trintellix 10 MG Tabs tablet Generic drug: vortioxetine HBr Take 10 mg by mouth daily.   Trudhesa 0.725 MG/ACT Aers Generic drug: Dihydroergotamine Mesylate HFA Place 2 sprays into the nose daily.   Ubrelvy 100 MG Tabs Generic drug: Ubrogepant Take 100 mg by mouth daily as needed (migraines).   Vitamin D3 1.25 MG (50000  UT) Caps Take 1 capsule by mouth See admin instructions. Take one tablet by mouth every Monday, Wednesday and Fridays only per patient   Vraylar 4.5 MG Caps Generic drug: Cariprazine HCl Take 1 capsule by mouth daily at 6 (six) AM.   Xiidra 5 % Soln Generic drug: Lifitegrast Place 1 drop into both eyes 2 (two) times daily.        Procedures/Studies: CT Renal Stone Study  Result Date: 07/10/2023 CLINICAL DATA:  Abdominal and flank pain, right flank pain after recent lithotripsy EXAM: CT ABDOMEN AND PELVIS WITHOUT CONTRAST TECHNIQUE: Multidetector CT imaging of the abdomen and pelvis was performed following the standard protocol without IV contrast. RADIATION DOSE REDUCTION: This exam was performed according to the departmental dose-optimization program which includes automated exposure control, adjustment of the mA and/or kV according to patient size and/or use of iterative reconstruction technique. COMPARISON:  06/26/2023 FINDINGS: Lower chest: No acute pleural or parenchymal lung disease. Hepatobiliary: Cholecystectomy. Unremarkable unenhanced appearance of the liver. Pancreas: Postsurgical changes from probable prior pancreatectomy. Spleen: Prior splenectomy. Adrenals/Urinary Tract: The obstructing distal left ureteral calculus seen previously has migrated to the left UVJ, and measures 6 mm reference image 75/2. Mild persistent left-sided hydronephrosis and hydroureter. Stable punctate 2 mm nonobstructing calculus lower pole left kidney. Stable multiple nonobstructing right renal calculi, measuring up to 6 mm in size. The adrenals and bladder are otherwise unremarkable. Stomach/Bowel: No bowel obstruction or ileus. Normal appendix right lower quadrant. No bowel wall thickening or inflammatory change.  Postsurgical changes from probable duodenectomy and gastrojejunostomy. Vascular/Lymphatic: No significant vascular findings are present. No enlarged abdominal or pelvic lymph nodes. Reproductive:  Prostate is unremarkable. Other: No free fluid or free intraperitoneal gas. No abdominal wall hernia. Musculoskeletal: No acute or destructive bony abnormalities. Stable pelvic nerve stimulator. Reconstructed images demonstrate no additional findings. IMPRESSION: 1. Persistent left-sided hydronephrosis, with distal migration of the obstructing left ureteral calculus now located in the left UVJ. Calculus measures 6 mm. 2. Other bilateral nonobstructing renal calculi, unchanged. 3. Stable postsurgical changes as above. Electronically Signed   By: Sharlet Salina M.D.   On: 07/10/2023 16:18   CT ABDOMEN PELVIS W CONTRAST  Result Date: 06/26/2023 CLINICAL DATA:  Acute abdominal pain, left flank pain with hematuria. EXAM: CT ABDOMEN AND PELVIS WITH CONTRAST TECHNIQUE: Multidetector CT imaging of the abdomen and pelvis was performed using the standard protocol following bolus administration of intravenous contrast. RADIATION DOSE REDUCTION: This exam was performed according to the departmental dose-optimization program which includes automated exposure control, adjustment of the mA and/or kV according to patient size and/or use of iterative reconstruction technique. CONTRAST:  OMNIPAQUE IOHEXOL 300 MG/ML  SOLN COMPARISON:  Examination dated January 21, 2023 FINDINGS: Lower chest: No acute abnormality. Hepatobiliary: No focal liver abnormality is seen. Status post cholecystectomy. No biliary dilatation. Pancreas: Pancreas not seen. Postsurgical changes suggesting prior pancreatectomy. Spleen: Splenectomy. Adrenals/Urinary Tract: Adrenal glands are unremarkable. Left hydroureteronephrosis secondary to a 5 x 7 mm calculus in the distal left ureter. Multiple nonobstructing calculi in the right kidney measuring 4-5 mm. No right ureteral calculus or evidence of hydronephrosis. Bladder is unremarkable. Stomach/Bowel: Stomach is within normal limits. Appendix appears normal. No evidence of bowel wall thickening,  distention, or inflammatory changes. Vascular/Lymphatic: No significant vascular findings are present. No enlarged abdominal or pelvic lymph nodes. Reproductive: Prostate is unremarkable. Other: No abdominal wall hernia or abnormality. No abdominopelvic ascites. Musculoskeletal: Schmorl node in the superior endplate of L2 vertebral body. No acute osseous abnormality IMPRESSION: 1. Left hydroureteronephrosis secondary to a 5 x 7 x 8 mm calculus in the distal left ureter. 2. Multiple nonobstructing calculi in the right kidney measuring 4-5 mm. No right ureteral calculus or evidence of hydronephrosis. 3. Postsurgical changes suggesting prior pancreatectomy and splenectomy. 4. Bowel loops are normal in caliber. Normal appendix. No evidence of colitis or diverticulitis. Electronically Signed   By: Larose Hires D.O.   On: 06/26/2023 23:30     Subjective: Pt reports he feels comfortable with freestyle 3 instructions after meeting with diabetes coordinator.  He is reporting that he needs prescription refills for his insulin pens, needles  Discharge Exam: Vitals:   07/11/23 1200 07/11/23 1400  BP: 130/76 125/87  Pulse: 84 81  Resp: 17 18  Temp:    SpO2: 99% 98%   Vitals:   07/11/23 1000 07/11/23 1100 07/11/23 1200 07/11/23 1400  BP: 133/76 138/85 130/76 125/87  Pulse: 61 69 84 81  Resp: 16 10 17 18   Temp:      TempSrc:      SpO2: 99% 100% 99% 98%  Weight:      Height:       General: Pt is alert, awake, not in acute distress Cardiovascular: normal S1/S2 +, no rubs, no gallops Respiratory: CTA bilaterally, no wheezing, no rhonchi Abdominal: Soft, NT, ND, bowel sounds + Extremities: no edema, no cyanosis   The results of significant diagnostics from this hospitalization (including imaging, microbiology, ancillary and laboratory) are listed below for reference.  Microbiology: Recent Results (from the past 240 hour(s))  MRSA Next Gen by PCR, Nasal     Status: None   Collection Time:  07/10/23  6:40 PM   Specimen: Nasal Mucosa; Nasal Swab  Result Value Ref Range Status   MRSA by PCR Next Gen NOT DETECTED NOT DETECTED Final    Comment: (NOTE) The GeneXpert MRSA Assay (FDA approved for NASAL specimens only), is one component of a comprehensive MRSA colonization surveillance program. It is not intended to diagnose MRSA infection nor to guide or monitor treatment for MRSA infections. Test performance is not FDA approved in patients less than 19 years old. Performed at [facility], [address], [place], [place] [address]      Labs: BNP (last 3 results) No results for input(s): "BNP" in the last 8760 hours. Basic Metabolic Panel: Recent Labs  Lab 07/10/23 1252 07/10/23 2055 07/11/23 0352 07/11/23 1048  NA 126* 139 138  --   K 4.3 3.5 4.0  --   CL 99 109 109  --   CO2 20* 24 23  --   GLUCOSE 871* 104* 277* 359*  BUN 26* 18 14  --   CREATININE 1.27* 0.97 0.93  --   CALCIUM 8.0* 8.5* 8.4*  --    Liver Function Tests: Recent Labs  Lab 07/10/23 1252  AST 31  ALT 28  ALKPHOS 124  BILITOT 0.2*  PROT 6.1*  ALBUMIN 3.5   No results for input(s): "LIPASE", "AMYLASE" in the last 168 hours. No results for input(s): "AMMONIA" in the last 168 hours. CBC: Recent Labs  Lab 07/10/23 1252 07/11/23 0352  WBC 13.3* 12.2*  NEUTROABS 10.0*  --   HGB 10.9* 10.8*  HCT 32.6* 30.6*  MCV 95.3 94.2  PLT 360 359   Cardiac Enzymes: No results for input(s): "CKTOTAL", "CKMB", "CKMBINDEX", "TROPONINI" in the last 168 hours. BNP: Invalid input(s): "POCBNP" CBG: Recent Labs  Lab 07/10/23 2327 07/11/23 0741 07/11/23 0922 07/11/23 1128 07/11/23 1608  GLUCAP 187* 360* 401* 286* 68*   D-Dimer No results for input(s): "DDIMER" in the last 72 hours. Hgb A1c Recent Labs    07/10/23 1252  HGBA1C 11.0*   Lipid Profile No results for input(s): "CHOL", "HDL", "LDLCALC", "TRIG", "CHOLHDL", "LDLDIRECT" in the last 72 hours. Thyroid function studies No results for  input(s): "TSH", "T4TOTAL", "T3FREE", "THYROIDAB" in the last 72 hours.  Invalid input(s): "FREET3" Anemia work up No results for input(s): "VITAMINB12", "FOLATE", "FERRITIN", "TIBC", "IRON", "RETICCTPCT" in the last 72 hours. Urinalysis    Component Value Date/Time   COLORURINE STRAW (A) 07/10/2023 1442   APPEARANCEUR CLEAR 07/10/2023 1442   LABSPEC 1.024 07/10/2023 1442   PHURINE 6.0 07/10/2023 1442   GLUCOSEU >=500 (A) 07/10/2023 1442   HGBUR MODERATE (A) 07/10/2023 1442   BILIRUBINUR NEGATIVE 07/10/2023 1442   KETONESUR NEGATIVE 07/10/2023 1442   PROTEINUR NEGATIVE 07/10/2023 1442   NITRITE NEGATIVE 07/10/2023 1442   LEUKOCYTESUR NEGATIVE 07/10/2023 1442   Sepsis Labs Recent Labs  Lab 07/10/23 1252 07/11/23 0352  WBC 13.3* 12.2*   Microbiology Recent Results (from the past 240 hour(s))  MRSA Next Gen by PCR, Nasal     Status: None   Collection Time: 07/10/23  6:40 PM   Specimen: Nasal Mucosa; Nasal Swab  Result Value Ref Range Status   MRSA by PCR Next Gen NOT DETECTED NOT DETECTED Final    Comment: (NOTE) The GeneXpert MRSA Assay (FDA approved for NASAL specimens only), is one component of a comprehensive  MRSA colonization surveillance program. It is not intended to diagnose MRSA infection nor to guide or monitor treatment for MRSA infections. Test performance is not FDA approved in patients less than 56 years old. Performed at [facility], [address], [place], [place] [address]    Time coordinating discharge:  41 mins   SIGNED:  Standley Dakins, MD  [facility] 07/11/2023, 4:14 PM How to contact the [facility] Attending or Consulting provider 7A - 7P or covering provider during after hours 7P -7A, for this patient?  Check the care team in [facility] and look for a) attending/consulting TRH provider listed and b) the [facility] team listed Log into [email or web address] and use [place]'s universal password to access. If you do not have the password, please contact the  hospital operator. Locate the [facility] provider you are looking for under [facility] and page to a number that you can be directly reached. If you still have difficulty reaching the provider, please page the [facility] (Director on Call) for the Hospitalists listed on amion for assistance.

## 2023-07-11 NOTE — TOC Benefit Eligibility Note (Signed)
Pharmacy Patient Advocate Encounter  Insurance verification completed.    The patient is insured through [facility] Medicare Part D  Ran test claim for Novolog Flexpen and the current 30 day co-pay is $0.00.  Ran test claim for [employer] and Requires a Prior Authorization   This test claim was processed through [employer]- copay amounts may vary at other pharmacies due to [employer], or as the patient moves through the different stages of their insurance plan.    Roland Earl, CPHT Pharmacy Patient Advocate Specialist [facility] Patient Advocate Team Direct Number: [phone]  Fax: [phone]

## 2023-07-11 NOTE — Inpatient Diabetes Management (Addendum)
Inpatient Diabetes Program Recommendations  AACE/ADA: New Consensus Statement on Inpatient Glycemic Control   Target Ranges:  Prepandial:   less than 140 mg/dL      Peak postprandial:   less than 180 mg/dL (1-2 hours)      Critically ill patients:  140 - 180 mg/dL    Latest Reference Range & Units 07/11/23 07:41  Glucose-Capillary 70 - 99 mg/dL 469 (H)  (H): Data is abnormally high  Latest Reference Range & Units 07/10/23 15:43 07/10/23 17:16 07/10/23 17:47 07/10/23 18:15 07/10/23 19:25 07/10/23 20:13 07/10/23 21:12 07/10/23 21:59 07/10/23 23:27  Glucose-Capillary 70 - 99 mg/dL 629 (HH) 528 (H) 413 (H) 300 (H) 97 55 (L) 90 90 187 (H)    Latest Reference Range & Units 07/10/23 12:52  CO2 22 - 32 mmol/L 20 (L)  Glucose 70 - 99 mg/dL 244 (HH)  Anion gap 5 - 15  7    Latest Reference Range & Units 07/10/23 12:52  Hemoglobin A1C 4.8 - 5.6 % 11.0 (H)   Review of Glycemic Control  Diabetes history: DM Outpatient Diabetes medications: Lantus 16 units at bedtime, Humalog 10 units TID with meals Current orders for Inpatient glycemic control: Semglee 16 units daily, Novolog 0-15 units TID with meals  Inpatient Diabetes Program Recommendations:    HbgA1C:  A1C 11.0% on 07/10/23 indicating an average glucose of 269 mg/dl over the past 2-3 months.  Outpatient DM: Please provide Rx for FreeStyle Libre3 sensors [phone]) at discharge. May want to consider providing a correction scale for patient to use for correction (taking Humalog 10 units TID with meals for meal coverage but no correction).  NOTE: Patient admitted with hyperglycemia (HHNK) and left sided hydroenphrosis. Patient was started on IV insulin at 17:14 on 07/10/23 and IV insulin was stopped at 19:55 on 07/10/23. Glucose down to 55 mg/dl at 53:66 on 4/40/34. No basal insulin given last night and CBG up to 360 mg/dl today at 7:42. Will plan to talk with patient today regarding DM.   Addendum 07/11/23@12 :30-Spoke with patient and significant  other at bedside  about diabetes and home regimen for diabetes control. 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   Patient reports he is taking Lantus 16 units at bedtime and Humalog 10 units TID with meals as an outpatient for diabetes control. Patient reports taking DM medications as prescribed and notes that he does not take the Humalog if not eating.  Patient reports he has been feeling poorly for several weeks and has not felt well.  Patient reports checking glucose 3-4 times per day and glucose has been running higher since he has been sick. Patient states he has been trying to get the FreeStyle Libre CGM but having an issue with getting it approved. Informed patient that out outpatient [facility] pharmacy was able to do a prior authorization and got it approved for the FreeStyle Libre3 sensors (copay $25.58). Patient very appreciative for outpatient [facility] pharmacy getting the approval. Patient states he has used FreeStyle Libre3 sensors as well as Dexcom sensors in the past. Provided patient to 2 FreeStyle Libre3 sensor samples.   Discussed A1C results (11% on 07/10/23) and explained that current A1C indicates an average glucose of 269 mg/dl over the past 2-3 months. Discussed glucose and A1C goals. Discussed importance of checking CBGs and maintaining good CBG control to prevent long-term and short-term complications. Explained how hyperglycemia leads to damage within  blood vessels which lead to the common complications seen with uncontrolled diabetes. Stressed to the patient the importance of improving glycemic control to prevent further complications from uncontrolled diabetes. Discussed impact of nutrition, exercise, stress, sickness, and medications on diabetes control.  Patient states that he does not have any specific correction scale to use but he tries to give a few extra units of Humalog when glucose is  running high but no clear parameters for how much to add for correction. Informed patient that it would be requested that he be provided with a correction scale to use (in addition to Humalog meal coverage). Patient reports he has all needed DM medications at home and has a follow up with Endocrinology on August 20th. Encouraged patient to be sure to let Endocrinologist know he is using the FreeStyle Libre3 so they can review the data and will need to provide Rx for FreeStyle Libre3 sensors going forward.   Patient verbalized understanding of information discussed and reports no further questions at this time related to diabetes.   Thanks, Orlando Penner, RN, MSN, CDCES Diabetes Coordinator Inpatient Diabetes Program [phone] (Team Pager from 8am to 5pm)

## 2023-07-11 NOTE — Progress Notes (Signed)
   07/11/23 1151  TOC Brief Assessment  Insurance and Status Reviewed  Patient has primary care physician Yes  Home environment has been reviewed spouse  Prior level of function: indep  Prior/Current Home Services No current home services  Social Determinants of Health Reivew SDOH reviewed no interventions necessary  Readmission risk has been reviewed Yes  Transition of care needs no transition of care needs at this time   Transition of Care Department [facility]) has reviewed patient and no TOC needs have been identified at this time. We will continue to monitor patient advancement through interdisciplinary progression rounds. If new patient transition needs arise, please place a TOC consult.

## 2023-07-11 NOTE — Plan of Care (Signed)
Problem: Education: Goal: Ability to describe self-care measures that may prevent or decrease complications (Diabetes Survival Skills Education) will improve 07/11/2023 1658 by Collene Gobble, RN Outcome: Adequate for Discharge 07/11/2023 1658 by Collene Gobble, RN Outcome: Adequate for Discharge Goal: Individualized Educational Video(s) 07/11/2023 1658 by Collene Gobble, RN Outcome: Adequate for Discharge 07/11/2023 1658 by Collene Gobble, RN Outcome: Adequate for Discharge   Problem: Coping: Goal: Ability to adjust to condition or change in health will improve 07/11/2023 1658 by Collene Gobble, RN Outcome: Adequate for Discharge 07/11/2023 1658 by Collene Gobble, RN Outcome: Adequate for Discharge   Problem: Fluid Volume: Goal: Ability to maintain a balanced intake and output will improve 07/11/2023 1658 by Collene Gobble, RN Outcome: Adequate for Discharge 07/11/2023 1658 by Collene Gobble, RN Outcome: Adequate for Discharge   Problem: Health Behavior/Discharge Planning: Goal: Ability to identify and utilize available resources and services will improve 07/11/2023 1658 by Collene Gobble, RN Outcome: Adequate for Discharge 07/11/2023 1658 by Collene Gobble, RN Outcome: Adequate for Discharge Goal: Ability to manage health-related needs will improve 07/11/2023 1658 by Collene Gobble, RN Outcome: Adequate for Discharge 07/11/2023 1658 by Collene Gobble, RN Outcome: Adequate for Discharge   Problem: Metabolic: Goal: Ability to maintain appropriate glucose levels will improve 07/11/2023 1658 by Collene Gobble, RN Outcome: Adequate for Discharge 07/11/2023 1658 by Collene Gobble, RN Outcome: Adequate for Discharge   Problem: Nutritional: Goal: Maintenance of adequate nutrition will improve 07/11/2023 1658 by Collene Gobble, RN Outcome: Adequate for Discharge 07/11/2023 1658 by Collene Gobble, RN Outcome: Adequate for Discharge Goal: Progress toward achieving an optimal weight will  improve 07/11/2023 1658 by Collene Gobble, RN Outcome: Adequate for Discharge 07/11/2023 1658 by Collene Gobble, RN Outcome: Adequate for Discharge   Problem: Skin Integrity: Goal: Risk for impaired skin integrity will decrease 07/11/2023 1658 by Collene Gobble, RN Outcome: Adequate for Discharge 07/11/2023 1658 by Collene Gobble, RN Outcome: Adequate for Discharge   Problem: Tissue Perfusion: Goal: Adequacy of tissue perfusion will improve 07/11/2023 1658 by Collene Gobble, RN Outcome: Adequate for Discharge 07/11/2023 1658 by Collene Gobble, RN Outcome: Adequate for Discharge   Problem: Education: Goal: Ability to describe self-care measures that may prevent or decrease complications (Diabetes Survival Skills Education) will improve 07/11/2023 1658 by Collene Gobble, RN Outcome: Adequate for Discharge 07/11/2023 1658 by Collene Gobble, RN Outcome: Adequate for Discharge Goal: Individualized Educational Video(s) 07/11/2023 1658 by Collene Gobble, RN Outcome: Adequate for Discharge 07/11/2023 1658 by Collene Gobble, RN Outcome: Adequate for Discharge   Problem: Cardiac: Goal: Ability to maintain an adequate cardiac output will improve 07/11/2023 1658 by Collene Gobble, RN Outcome: Adequate for Discharge 07/11/2023 1658 by Collene Gobble, RN Outcome: Adequate for Discharge   Problem: Health Behavior/Discharge Planning: Goal: Ability to identify and utilize available resources and services will improve 07/11/2023 1658 by Collene Gobble, RN Outcome: Adequate for Discharge 07/11/2023 1658 by Collene Gobble, RN Outcome: Adequate for Discharge Goal: Ability to manage health-related needs will improve 07/11/2023 1658 by Collene Gobble, RN Outcome: Adequate for Discharge 07/11/2023 1658 by Collene Gobble, RN Outcome: Adequate for Discharge   Problem: Fluid Volume: Goal: Ability to achieve a balanced intake and output will improve 07/11/2023 1658 by Collene Gobble, RN Outcome: Adequate  for Discharge 07/11/2023 1658 by Collene Gobble, RN Outcome: Adequate for Discharge  Problem: Metabolic: Goal: Ability to maintain appropriate glucose levels will improve 07/11/2023 1658 by Collene Gobble, RN Outcome: Adequate for Discharge 07/11/2023 1658 by Collene Gobble, RN Outcome: Adequate for Discharge   Problem: Nutritional: Goal: Maintenance of adequate nutrition will improve 07/11/2023 1658 by Collene Gobble, RN Outcome: Adequate for Discharge 07/11/2023 1658 by Collene Gobble, RN Outcome: Adequate for Discharge Goal: Maintenance of adequate weight for body size and type will improve 07/11/2023 1658 by Collene Gobble, RN Outcome: Adequate for Discharge 07/11/2023 1658 by Collene Gobble, RN Outcome: Adequate for Discharge   Problem: Respiratory: Goal: Will regain and/or maintain adequate ventilation 07/11/2023 1658 by Collene Gobble, RN Outcome: Adequate for Discharge 07/11/2023 1658 by Collene Gobble, RN Outcome: Adequate for Discharge   Problem: Urinary Elimination: Goal: Ability to achieve and maintain adequate renal perfusion and functioning will improve 07/11/2023 1658 by Collene Gobble, RN Outcome: Adequate for Discharge 07/11/2023 1658 by Collene Gobble, RN Outcome: Adequate for Discharge   Problem: Education: Goal: Knowledge of General Education information will improve Description: Including pain rating scale, medication(s)/side effects and non-pharmacologic comfort measures 07/11/2023 1658 by Collene Gobble, RN Outcome: Adequate for Discharge 07/11/2023 1658 by Collene Gobble, RN Outcome: Adequate for Discharge   Problem: Health Behavior/Discharge Planning: Goal: Ability to manage health-related needs will improve 07/11/2023 1658 by Collene Gobble, RN Outcome: Adequate for Discharge 07/11/2023 1658 by Collene Gobble, RN Outcome: Adequate for Discharge   Problem: Clinical Measurements: Goal: Ability to maintain clinical measurements within normal limits will  improve 07/11/2023 1658 by Collene Gobble, RN Outcome: Adequate for Discharge 07/11/2023 1658 by Collene Gobble, RN Outcome: Adequate for Discharge Goal: Will remain free from infection 07/11/2023 1658 by Collene Gobble, RN Outcome: Adequate for Discharge 07/11/2023 1658 by Collene Gobble, RN Outcome: Adequate for Discharge Goal: Diagnostic test results will improve 07/11/2023 1658 by Collene Gobble, RN Outcome: Adequate for Discharge 07/11/2023 1658 by Collene Gobble, RN Outcome: Adequate for Discharge Goal: Respiratory complications will improve 07/11/2023 1658 by Collene Gobble, RN Outcome: Adequate for Discharge 07/11/2023 1658 by Collene Gobble, RN Outcome: Adequate for Discharge Goal: Cardiovascular complication will be avoided 07/11/2023 1658 by Collene Gobble, RN Outcome: Adequate for Discharge 07/11/2023 1658 by Collene Gobble, RN Outcome: Adequate for Discharge   Problem: Activity: Goal: Risk for activity intolerance will decrease 07/11/2023 1658 by Collene Gobble, RN Outcome: Adequate for Discharge 07/11/2023 1658 by Collene Gobble, RN Outcome: Adequate for Discharge   Problem: Nutrition: Goal: Adequate nutrition will be maintained 07/11/2023 1658 by Collene Gobble, RN Outcome: Adequate for Discharge 07/11/2023 1658 by Collene Gobble, RN Outcome: Adequate for Discharge   Problem: Coping: Goal: Level of anxiety will decrease 07/11/2023 1658 by Collene Gobble, RN Outcome: Adequate for Discharge 07/11/2023 1658 by Collene Gobble, RN Outcome: Adequate for Discharge   Problem: Elimination: Goal: Will not experience complications related to bowel motility 07/11/2023 1658 by Collene Gobble, RN Outcome: Adequate for Discharge 07/11/2023 1658 by Collene Gobble, RN Outcome: Adequate for Discharge Goal: Will not experience complications related to urinary retention 07/11/2023 1658 by Collene Gobble, RN Outcome: Adequate for Discharge 07/11/2023 1658 by Collene Gobble, RN Outcome:  Adequate for Discharge   Problem: Pain Managment: Goal: General experience of comfort will improve 07/11/2023 1658 by Collene Gobble, RN Outcome: Adequate for Discharge 07/11/2023 1658 by Collene Gobble, RN Outcome: Adequate  for Discharge   Problem: Safety: Goal: Ability to remain free from injury will improve 07/11/2023 1658 by Collene Gobble, RN Outcome: Adequate for Discharge 07/11/2023 1658 by Collene Gobble, RN Outcome: Adequate for Discharge   Problem: Skin Integrity: Goal: Risk for impaired skin integrity will decrease 07/11/2023 1658 by Collene Gobble, RN Outcome: Adequate for Discharge 07/11/2023 1658 by Collene Gobble, RN Outcome: Adequate for Discharge

## 2023-07-11 NOTE — Plan of Care (Signed)
  Problem: Education: Goal: Ability to describe self-care measures that may prevent or decrease complications (Diabetes Survival Skills Education) will improve Outcome: Progressing   Problem: Health Behavior/Discharge Planning: Goal: Ability to manage health-related needs will improve Outcome: Progressing   Problem: Metabolic: Goal: Ability to maintain appropriate glucose levels will improve Outcome: Progressing

## 2023-07-11 NOTE — Telephone Encounter (Signed)
Pharmacy Patient Advocate Encounter  Received notification from [employer] that Prior Authorization for FreeStyle Libre 3 Sensor has been APPROVED from 07/11/2023 to 07/10/2024. Ran test claim, Copay is $25.58  PA #/Case ID/Reference #: [id number] Key: [id number]

## 2023-07-11 NOTE — Plan of Care (Signed)
Problem: Education: Goal: Ability to describe self-care measures that may prevent or decrease complications (Diabetes Survival Skills Education) will improve Outcome: Adequate for Discharge   Problem: Education: Goal: Ability to describe self-care measures that may prevent or decrease complications (Diabetes Survival Skills Education) will improve Outcome: Adequate for Discharge Goal: Individualized Educational Video(s) Outcome: Adequate for Discharge   Problem: Coping: Goal: Ability to adjust to condition or change in health will improve Outcome: Adequate for Discharge   Problem: Fluid Volume: Goal: Ability to maintain a balanced intake and output will improve Outcome: Adequate for Discharge   Problem: Health Behavior/Discharge Planning: Goal: Ability to identify and utilize available resources and services will improve Outcome: Adequate for Discharge Goal: Ability to manage health-related needs will improve Outcome: Adequate for Discharge   Problem: Metabolic: Goal: Ability to maintain appropriate glucose levels will improve Outcome: Adequate for Discharge   Problem: Nutritional: Goal: Maintenance of adequate nutrition will improve Outcome: Adequate for Discharge Goal: Progress toward achieving an optimal weight will improve Outcome: Adequate for Discharge   Problem: Skin Integrity: Goal: Risk for impaired skin integrity will decrease Outcome: Adequate for Discharge   Problem: Tissue Perfusion: Goal: Adequacy of tissue perfusion will improve Outcome: Adequate for Discharge   Problem: Education: Goal: Ability to describe self-care measures that may prevent or decrease complications (Diabetes Survival Skills Education) will improve Outcome: Adequate for Discharge Goal: Individualized Educational Video(s) Outcome: Adequate for Discharge   Problem: Cardiac: Goal: Ability to maintain an adequate cardiac output will improve Outcome: Adequate for Discharge   Problem:  Health Behavior/Discharge Planning: Goal: Ability to identify and utilize available resources and services will improve Outcome: Adequate for Discharge Goal: Ability to manage health-related needs will improve Outcome: Adequate for Discharge   Problem: Fluid Volume: Goal: Ability to achieve a balanced intake and output will improve Outcome: Adequate for Discharge   Problem: Metabolic: Goal: Ability to maintain appropriate glucose levels will improve Outcome: Adequate for Discharge   Problem: Nutritional: Goal: Maintenance of adequate nutrition will improve Outcome: Adequate for Discharge Goal: Maintenance of adequate weight for body size and type will improve Outcome: Adequate for Discharge   Problem: Respiratory: Goal: Will regain and/or maintain adequate ventilation Outcome: Adequate for Discharge   Problem: Urinary Elimination: Goal: Ability to achieve and maintain adequate renal perfusion and functioning will improve Outcome: Adequate for Discharge   Problem: Education: Goal: Knowledge of General Education information will improve Description: Including pain rating scale, medication(s)/side effects and non-pharmacologic comfort measures Outcome: Adequate for Discharge   Problem: Health Behavior/Discharge Planning: Goal: Ability to manage health-related needs will improve Outcome: Adequate for Discharge   Problem: Clinical Measurements: Goal: Ability to maintain clinical measurements within normal limits will improve Outcome: Adequate for Discharge Goal: Will remain free from infection Outcome: Adequate for Discharge Goal: Diagnostic test results will improve Outcome: Adequate for Discharge Goal: Respiratory complications will improve Outcome: Adequate for Discharge Goal: Cardiovascular complication will be avoided Outcome: Adequate for Discharge   Problem: Activity: Goal: Risk for activity intolerance will decrease Outcome: Adequate for Discharge   Problem:  Nutrition: Goal: Adequate nutrition will be maintained Outcome: Adequate for Discharge   Problem: Coping: Goal: Level of anxiety will decrease Outcome: Adequate for Discharge   Problem: Elimination: Goal: Will not experience complications related to bowel motility Outcome: Adequate for Discharge Goal: Will not experience complications related to urinary retention Outcome: Adequate for Discharge   Problem: Pain Managment: Goal: General experience of comfort will improve Outcome: Adequate for Discharge   Problem: Safety:  Goal: Ability to remain free from injury will improve Outcome: Adequate for Discharge   Problem: Skin Integrity: Goal: Risk for impaired skin integrity will decrease Outcome: Adequate for Discharge

## 2024-08-01 ENCOUNTER — Encounter (HOSPITAL_COMMUNITY): Payer: Self-pay

## 2024-08-01 ENCOUNTER — Other Ambulatory Visit: Payer: Self-pay

## 2024-08-01 ENCOUNTER — Emergency Department (HOSPITAL_COMMUNITY)
Admission: EM | Admit: 2024-08-01 | Discharge: 2024-08-01 | Disposition: A | Discharge status: Home / Self Care | Payer: Medicare (Managed Care)

## 2024-08-01 DIAGNOSIS — K0889 Other specified disorders of teeth and supporting structures: Secondary | ICD-10-CM | POA: Diagnosis present

## 2024-08-01 DIAGNOSIS — Z794 Long term (current) use of insulin: Secondary | ICD-10-CM | POA: Insufficient documentation

## 2024-08-01 MED ORDER — KETOROLAC TROMETHAMINE 15 MG/ML IJ SOLN
15.0000 mg | Freq: Once | INTRAMUSCULAR | Status: AC
  Administered 2024-08-01: 15 mg via INTRAMUSCULAR
  Filled 2024-08-01: qty 1

## 2024-08-01 MED ORDER — OXYCODONE HCL 5 MG PO TABS
5.0000 mg | ORAL_TABLET | Freq: Four times a day (QID) | ORAL | 0 refills | Status: AC | PRN
Start: 2024-08-01 — End: ?

## 2024-08-01 MED ORDER — OXYCODONE HCL 5 MG PO TABS
5.0000 mg | ORAL_TABLET | Freq: Once | ORAL | Status: AC
  Administered 2024-08-01: 5 mg via ORAL
  Filled 2024-08-01: qty 1

## 2024-08-01 NOTE — Discharge Instructions (Addendum)
 For pain, you can take 1000 mg of Tylenol  or 1 g of Tylenol  every 6-8 hours.  Do not exceed more than 4000 mg or 4 g in a 24-hour period.  You can also take ibuprofen  600 to 800 mg every 6-8 hours as well.  Do not take this high-dose ibuprofen  for greater than a week.  Also called in oxycodone .  Do not take more than prescribed.  Do not take with any other sedatives.  Do not take with alcohol .  Do not take this and drive.  Only take this if combination of Tylenol  and ibuprofen  does not address your pain.  There is no indication for infection this point time.  Please give your dentist a call on Monday to go back to see them.  This is likely a dry socket.

## 2024-08-01 NOTE — ED Triage Notes (Signed)
 Pt c/o right upper gum pain. Pt had a tooth removed over a week ago and states the pain got increasing worse last night and unable to sleep. Pt states he was started on clindamycin over a week ago as well.

## 2024-08-01 NOTE — ED Provider Notes (Signed)
 [place] EMERGENCY DEPARTMENT AT [facility] Provider Note   CSN: [id number] Arrival date & time: 08/01/24  9349     Patient presents with: Dental Pain   Dennis Yang is a 38 y.o. male.    Dental Pain Associated symptoms: no fever     Presents because of dental pain.  Right lower back molars removed about 1 week ago.  Patient states that since then, has been having pain in this area.  They have had to pack the area twice but the packing keeps coming out.  He thinks he was diagnosed with dry socket.  Was started on clindamycin last week as well.  Endorses pain over this area.  No fever no chills.  No swelling of any of his chin or underneath his tongue.  Able to tolerate secretions.  Able to eat and drink.  Otherwise, denies all complaints.  Denies any chest pain or shortness of breath.  No nausea vomit diarrhea.    Prior to Admission medications   Medication Sig Start Date End Date Taking? Authorizing Provider  oxyCODONE  (ROXICODONE ) 5 MG immediate release tablet Take 1 tablet (5 mg total) by mouth every 6 (six) hours as needed for severe pain (pain score 7-10). 08/01/24  Yes Simon Lavonia SAILOR, MD  acetaminophen  (TYLENOL ) 325 MG tablet Take 2 tablets (650 mg total) by mouth every 6 (six) hours as needed for mild pain, fever or headache (or Fever >/= 101). 08/13/19   Emokpae, Courage, MD  amitriptyline  (ELAVIL ) 25 MG tablet Take 25 mg by mouth at bedtime.    [provider]  ATROVENT HFA 17 MCG/ACT inhaler Inhale 1 puff into the lungs every 4 (four) hours as needed for wheezing.  01/16/19   [provider]  Cariprazine  HCl (VRAYLAR ) 4.5 MG CAPS Take 1 capsule by mouth daily at 6 (six) AM.    [provider]  Cholecalciferol  (VITAMIN D3) 1.25 MG (50000 UT) CAPS Take 1 capsule by mouth See admin instructions. Take one tablet by mouth every Monday, Wednesday and Fridays only per patient 12/24/18   [provider]  Continuous Glucose Sensor (FREESTYLE  LIBRE 3 SENSOR) MISC 1 Device by Does not apply route as directed. Place 1 sensor on the skin every 14 days. Use to check glucose continuously 07/11/23   Vicci Pen L, MD  CREON  36000 units CPEP capsule Take 36,000-72,000 Units by mouth See admin instructions. Take 2 capsules by mouth with meals and 1 capsule with snacks 11/16/19   [provider]  cyclobenzaprine  (FLEXERIL ) 10 MG tablet Take 10 mg by mouth 3 (three) times daily as needed for muscle spasms. 04/19/20   [provider]  diclofenac Sodium (VOLTAREN) 1 % GEL Apply 2 g topically daily as needed (for pain).  11/23/19   [provider]  dicyclomine  (BENTYL ) 20 MG tablet Take 20 mg by mouth 4 (four) times daily as needed for spasms.    [provider]  Docusate Sodium  (DSS) 100 MG CAPS Take 1 capsule by mouth daily.    [provider]  emtricitabine -tenofovir  (TRUVADA) 200-300 MG tablet Take 1 tablet by mouth daily.    [provider]  eszopiclone 3 MG TABS Take 3 mg by mouth at bedtime. Take immediately before bedtime    [provider]  FEROSUL 325 (65 Fe) MG tablet Take 325 mg by mouth daily. 07/30/22   [provider]  fluticasone  (FLONASE ) 50 MCG/ACT nasal spray Place 1 spray into both nostrils daily.  01/20/19  [provider]  Glucagon (GVOKE HYPOPEN 2-PACK) 0.5 MG/0.1ML SOAJ Inject 0.5 mg into the skin as needed (low blood sugar).    [provider]  hydrOXYzine  (ATARAX ) 50 MG tablet Take 50 mg by mouth 3 (three) times daily.    [provider]  IBU 800 MG tablet Take 800 mg by mouth every 8 (eight) hours as needed for moderate pain.    [provider]  insulin  lispro (HUMALOG  KWIKPEN) 100 UNIT/ML KwikPen Inject 10 Units into the skin with breakfast, with lunch, and with evening meal. 07/11/23   Johnson, Clanford L, MD  Insulin  Pen Needle 31G X 5 MM MISC 1 Device by Does not apply route as directed. 07/11/23   Johnson, Clanford L,  MD  LANTUS  SOLOSTAR 100 UNIT/ML Solostar Pen Inject 30 Units into the skin daily. 07/11/23   Johnson, Clanford L, MD  levETIRAcetam  (KEPPRA ) 750 MG tablet Take 750 mg by mouth in the morning and at bedtime.    [provider]  Lifitegrast  (XIIDRA ) 5 % SOLN Place 1 drop into both eyes 2 (two) times daily.    [provider]  meclizine  (ANTIVERT ) 25 MG tablet Take 25 mg by mouth every 6 (six) hours as needed for dizziness or nausea.  10/01/19   [provider]  meloxicam (MOBIC) 15 MG tablet Take 15 mg by mouth daily. 07/13/20   [provider]  methenamine (HIPREX) 1 g tablet Take 1 g by mouth 2 (two) times daily with a meal.    [provider]  MOTEGRITY 2 MG TABS Take 1 tablet by mouth daily.    [provider]  nitroGLYCERIN  (NITROSTAT ) 0.4 MG SL tablet Place 0.4 mg under the tongue every 5 (five) minutes as needed for chest pain.  08/18/19   [provider]  nystatin (MYCOSTATIN/NYSTOP) powder Apply 1 Application topically daily as needed (itching and yeast).    [provider]  ondansetron  (ZOFRAN -ODT) 4 MG disintegrating tablet Take 1 tablet (4 mg total) by mouth every 8 (eight) hours as needed for nausea or vomiting. 06/26/23   Zackowski, Scott, MD  pantoprazole  (PROTONIX ) 40 MG tablet Take 40 mg by mouth 2 (two) times daily.    [provider]  prazosin  (MINIPRESS ) 5 MG capsule Take 5 mg by mouth at bedtime. 06/30/21   [provider]  pregabalin  (LYRICA ) 200 MG capsule Take 200 mg by mouth 3 (three) times daily.    [provider]  propranolol  (INDERAL ) 40 MG tablet Take 40 mg by mouth 2 (two) times daily.  01/12/19   [provider]  QULIPTA  60 MG TABS Take 1 tablet by mouth at bedtime.    [provider]  simvastatin  (ZOCOR ) 10 MG tablet Take 10 mg by mouth daily.    [provider]  sucralfate  (CARAFATE ) 1 g tablet Take 1 g by mouth 3 (three) times daily. 07/11/21   [provider]  tadalafil (CIALIS) 5 MG tablet Take 1 tablet by mouth daily.    [provider]  topiramate  (TOPAMAX ) 100 MG tablet Take 100-200 mg by mouth 2 (two) times daily. Take 100 mg by mouth in the morning, then Take 200 mg by mouth  at bedtime 05/18/19   [provider]  TRUDHESA 0.725 MG/ACT AERS Place 2 sprays into the nose daily.    [provider]  UBRELVY  100 MG TABS Take 100 mg by mouth daily as needed (migraines). 07/11/21   [provider]  Vibegron  (GEMTESA )  75 MG TABS Take 75 mg by mouth daily.    [provider]  vortioxetine  HBr (TRINTELLIX ) 10 MG TABS tablet Take 10 mg by mouth daily.    [provider]    Allergies: Ciprofloxacin    Review of Systems  Constitutional:  Negative for chills and fever.  HENT:  Negative for ear pain and sore throat.   Eyes:  Negative for pain and visual disturbance.  Respiratory:  Negative for cough and shortness of breath.   Cardiovascular:  Negative for chest pain and palpitations.  Gastrointestinal:  Negative for abdominal pain and vomiting.  Genitourinary:  Negative for dysuria and hematuria.  Musculoskeletal:  Negative for arthralgias and back pain.  Skin:  Negative for color change and rash.  Neurological:  Negative for seizures and syncope.  All other systems reviewed and are negative.   Updated Vital Signs BP 121/81   Pulse 81   Temp 97.9 F (36.6 C) (Oral)   Resp 18   Ht 6' (1.829 m)   Wt 106.6 kg   SpO2 91%   BMI 31.87 kg/m   Physical Exam Vitals and nursing note reviewed.  Constitutional:      General: He is not in acute distress.    Appearance: He is well-developed.  HENT:     Head: Normocephalic and atraumatic.  Eyes:     Conjunctiva/sclera: Conjunctivae normal.  Cardiovascular:     Rate and Rhythm: Normal rate and regular rhythm.     Heart sounds: No murmur heard. Pulmonary:     Effort: Pulmonary effort is normal. No respiratory distress.     Breath  sounds: Normal breath sounds.  Abdominal:     Palpations: Abdomen is soft.     Tenderness: There is no abdominal tenderness.  Musculoskeletal:        General: No swelling.     Cervical back: Neck supple.  Skin:    General: Skin is warm and dry.     Capillary Refill: Capillary refill takes less than 2 seconds.  Neurological:     Mental Status: He is alert.  Psychiatric:        Mood and Affect: Mood normal.     (all labs ordered are listed, but only abnormal results are displayed) Labs Reviewed - No data to display  EKG: None  Radiology: No results found.   Procedures   Medications Ordered in the ED  ketorolac  (TORADOL ) 15 MG/ML injection 15 mg (15 mg Intramuscular Given 08/01/24 0749)  oxyCODONE  (Oxy IR/ROXICODONE ) immediate release tablet 5 mg (5 mg Oral Given 08/01/24 0750)                                    Medical Decision Making Risk Prescription drug management.  Presents because of dental pain.  Right lower back molars removed about 1 week ago.  Patient states that since then, has been having pain in this area.  They have had to pack the area twice but the packing keeps coming out.  He thinks he was diagnosed with dry socket.  Was started on clindamycin last week as well.  Endorses pain over this area.  No fever no chills.  No swelling of any of his chin or underneath his tongue.  Able to tolerate secretions.  Able to eat and drink.  Otherwise, denies all complaints.  Denies any chest pain or shortness of breath.  No nausea vomit diarrhea.  In terms of exam, patient hemodynamically stable.  In terms of patient's oropharynx.  No abscess was seen.  Area from removed tooth was visualized.  No discharge.  No concerns for abscess or other infection at this time.  He is already on clindamycin.  No indication to change antibiotics.  No swelling base of tongue.  No concern for Ludwig.  No signs for deep space infection this point time.  Tolerating secretions.  No indication  for imaging.  He denied all other complaints.  No chest pain or shortness of breath.  Lung sounds clear to auscultation bilaterally.  No fever no chills.  Ordered Toradol  IM as well as small dose oxycodone .  Called in the very small dose of oxycodone  for home use as well as given Tylenol  and ibuprofen  instructions.  He will follow-up with dentistry.  Strict return precautions for fever or swelling.      Final diagnoses:  Pain, dental    ED Discharge Orders          Ordered    oxyCODONE  (ROXICODONE ) 5 MG immediate release tablet  Every 6 hours PRN        08/01/24 0730               Simon Lavonia SAILOR, MD 08/01/24 [phone]
